# Patient Record
Sex: Female | Born: 1955
Health system: Southern US, Community
[De-identification: ages and names within clinical notes are randomized; demographics above are authoritative.]

## PROBLEM LIST (undated history)

## (undated) DIAGNOSIS — R000 Tachycardia, unspecified: Secondary | ICD-10-CM

## (undated) DIAGNOSIS — Z8489 Family history of other specified conditions: Secondary | ICD-10-CM

## (undated) DIAGNOSIS — G5622 Lesion of ulnar nerve, left upper limb: Secondary | ICD-10-CM

## (undated) DIAGNOSIS — J449 Chronic obstructive pulmonary disease, unspecified: Secondary | ICD-10-CM

## (undated) DIAGNOSIS — K219 Gastro-esophageal reflux disease without esophagitis: Secondary | ICD-10-CM

## (undated) DIAGNOSIS — Z98811 Dental restoration status: Secondary | ICD-10-CM

## (undated) DIAGNOSIS — Z8709 Personal history of other diseases of the respiratory system: Secondary | ICD-10-CM

## (undated) DIAGNOSIS — M1990 Unspecified osteoarthritis, unspecified site: Secondary | ICD-10-CM

## (undated) DIAGNOSIS — E039 Hypothyroidism, unspecified: Secondary | ICD-10-CM

## (undated) DIAGNOSIS — G5602 Carpal tunnel syndrome, left upper limb: Secondary | ICD-10-CM

## (undated) HISTORY — PX: THYROIDECTOMY, PARTIAL: SHX18

## (undated) HISTORY — DX: Hypothyroidism, unspecified: E03.9

## (undated) HISTORY — PX: CATARACT EXTRACTION W/ INTRAOCULAR LENS IMPLANT: SHX1309

## (undated) HISTORY — DX: Chronic obstructive pulmonary disease, unspecified: J44.9

## (undated) HISTORY — PX: BREAST LUMPECTOMY: SHX2

## (undated) HISTORY — PX: TONSILLECTOMY: SUR1361

## (undated) HISTORY — DX: Gastro-esophageal reflux disease without esophagitis: K21.9

## (undated) HISTORY — PX: MULTIPLE TOOTH EXTRACTIONS: SHX2053

---

## 2000-04-27 ENCOUNTER — Encounter: Admission: RE | Admit: 2000-04-27 | Discharge: 2000-04-27 | Payer: Self-pay | Admitting: Gastroenterology

## 2000-04-27 ENCOUNTER — Encounter: Payer: Self-pay | Admitting: Gastroenterology

## 2001-05-09 ENCOUNTER — Encounter: Admission: RE | Admit: 2001-05-09 | Discharge: 2001-05-09 | Payer: Self-pay | Admitting: Gastroenterology

## 2001-05-09 ENCOUNTER — Encounter: Payer: Self-pay | Admitting: Gastroenterology

## 2001-10-03 ENCOUNTER — Other Ambulatory Visit: Admission: RE | Admit: 2001-10-03 | Discharge: 2001-10-03 | Payer: Self-pay | Admitting: Obstetrics and Gynecology

## 2001-10-10 ENCOUNTER — Encounter: Payer: Self-pay | Admitting: Gastroenterology

## 2001-10-10 ENCOUNTER — Encounter: Admission: RE | Admit: 2001-10-10 | Discharge: 2001-10-10 | Payer: Self-pay | Admitting: Gastroenterology

## 2002-05-12 ENCOUNTER — Encounter: Payer: Self-pay | Admitting: Gastroenterology

## 2002-05-12 ENCOUNTER — Encounter: Admission: RE | Admit: 2002-05-12 | Discharge: 2002-05-12 | Payer: Self-pay | Admitting: Gastroenterology

## 2002-10-13 ENCOUNTER — Other Ambulatory Visit: Admission: RE | Admit: 2002-10-13 | Discharge: 2002-10-13 | Payer: Self-pay | Admitting: Obstetrics and Gynecology

## 2003-05-04 ENCOUNTER — Ambulatory Visit (HOSPITAL_COMMUNITY): Admission: RE | Admit: 2003-05-04 | Discharge: 2003-05-04 | Payer: Self-pay | Admitting: Gastroenterology

## 2003-05-14 ENCOUNTER — Encounter: Payer: Self-pay | Admitting: Gastroenterology

## 2003-05-14 ENCOUNTER — Encounter: Admission: RE | Admit: 2003-05-14 | Discharge: 2003-05-14 | Payer: Self-pay | Admitting: Gastroenterology

## 2003-11-29 ENCOUNTER — Other Ambulatory Visit: Admission: RE | Admit: 2003-11-29 | Discharge: 2003-11-29 | Payer: Self-pay | Admitting: Obstetrics and Gynecology

## 2004-05-26 ENCOUNTER — Encounter: Admission: RE | Admit: 2004-05-26 | Discharge: 2004-05-26 | Payer: Self-pay | Admitting: Gastroenterology

## 2005-03-26 ENCOUNTER — Ambulatory Visit (HOSPITAL_BASED_OUTPATIENT_CLINIC_OR_DEPARTMENT_OTHER): Admission: RE | Admit: 2005-03-26 | Discharge: 2005-03-26 | Payer: Self-pay | Admitting: Orthopedic Surgery

## 2005-03-26 ENCOUNTER — Encounter (INDEPENDENT_AMBULATORY_CARE_PROVIDER_SITE_OTHER): Payer: Self-pay | Admitting: *Deleted

## 2005-03-26 ENCOUNTER — Ambulatory Visit (HOSPITAL_COMMUNITY): Admission: RE | Admit: 2005-03-26 | Discharge: 2005-03-26 | Payer: Self-pay | Admitting: Orthopedic Surgery

## 2005-03-26 HISTORY — PX: MASS EXCISION: SHX2000

## 2005-05-29 ENCOUNTER — Encounter: Admission: RE | Admit: 2005-05-29 | Discharge: 2005-05-29 | Payer: Self-pay | Admitting: Gastroenterology

## 2006-01-01 ENCOUNTER — Encounter: Admission: RE | Admit: 2006-01-01 | Discharge: 2006-01-01 | Payer: Self-pay | Admitting: Gastroenterology

## 2006-06-22 ENCOUNTER — Encounter: Admission: RE | Admit: 2006-06-22 | Discharge: 2006-06-22 | Payer: Self-pay | Admitting: Gastroenterology

## 2006-06-30 ENCOUNTER — Ambulatory Visit: Payer: Self-pay | Admitting: Internal Medicine

## 2006-07-30 ENCOUNTER — Ambulatory Visit: Payer: Self-pay | Admitting: Internal Medicine

## 2007-06-29 ENCOUNTER — Encounter: Admission: RE | Admit: 2007-06-29 | Discharge: 2007-06-29 | Payer: Self-pay | Admitting: Gastroenterology

## 2007-11-16 ENCOUNTER — Encounter: Payer: Self-pay | Admitting: Internal Medicine

## 2008-07-02 ENCOUNTER — Encounter: Admission: RE | Admit: 2008-07-02 | Discharge: 2008-07-02 | Payer: Self-pay | Admitting: Gastroenterology

## 2008-07-13 ENCOUNTER — Encounter: Admission: RE | Admit: 2008-07-13 | Discharge: 2008-07-13 | Payer: Self-pay | Admitting: Gastroenterology

## 2009-07-26 ENCOUNTER — Encounter: Admission: RE | Admit: 2009-07-26 | Discharge: 2009-07-26 | Payer: Self-pay | Admitting: Internal Medicine

## 2009-09-20 ENCOUNTER — Encounter: Admission: RE | Admit: 2009-09-20 | Discharge: 2009-09-20 | Payer: Self-pay | Admitting: Internal Medicine

## 2010-07-28 ENCOUNTER — Encounter: Admission: RE | Admit: 2010-07-28 | Discharge: 2010-07-28 | Payer: Self-pay | Admitting: Obstetrics and Gynecology

## 2010-12-17 ENCOUNTER — Encounter
Admission: RE | Admit: 2010-12-17 | Discharge: 2010-12-17 | Payer: Self-pay | Source: Home / Self Care | Attending: Obstetrics and Gynecology | Admitting: Obstetrics and Gynecology

## 2010-12-30 NOTE — Consult Note (Signed)
Summary: [facility]  [facility]   Imported By: Esmeralda Links D'jimraou 12/28/2007 12:26:16  _____________________________________________________________________  External Attachment:    Type:   Image     Comment:   External Document

## 2011-04-17 NOTE — Op Note (Signed)
NAMECHANCEY, CULLINANE                ACCOUNT NO.:  [id number]   MEDICAL RECORD NO.:  [id number]          PATIENT TYPE:  AMB   LOCATION:  DSC                          FACILITY:  [facility]   PHYSICIAN:  Cindee Salt, M.D.       DATE OF BIRTH:  02-05-1956   DATE OF PROCEDURE:  03/26/2005  DATE OF DISCHARGE:                                 OPERATIVE REPORT   PREOPERATIVE DIAGNOSIS:  Mass, left thumb.   POSTOPERATIVE DIAGNOSIS:  Mass, left thumb.   OPERATION:  Excision of mass, left thumb.   SURGEON:  Cindee Salt, M.D.   ASSISTANT:  Carolyne Fiscal R.N.   ANESTHESIA:  Forearm based IV regional.   HISTORY:  The patient is a 55 year old female with a history of a mass on  the ulnar aspect of the distal phalanx of her left thumb. This is painful.  It has a white pointing area.   PROCEDURE:  The patient is brought to the operating room where a forearm  based IV regional anesthetic was carried out without difficulty. She was  prepped using DuraPrep, supine position, left arm free. A mid lateral  incision was made directly over the mass, carried down through subcutaneous  tissue. A white, pearly, multilobulated solid structure was immediately  encountered.  With blunt and sharp dissection, this was dissected free  protecting neurovascular structures. The specimen was sent to pathology. It  was not opened on the table.  It had the appearance of an epidermal  inclusion cyst. The wound was irrigated. Skin was closed with interrupted 5-  0 nylon sutures. A sterile compressive dressing was applied. The patient  tolerated the procedure well and was taken to the recovery room for  observation in satisfactory condition. She is discharged home to return to  the [facility] [place] in one week on [place] NX.      GK/MEDQ  D:  03/26/2005  T:  03/26/2005  Job:  [id number]

## 2011-04-17 NOTE — Assessment & Plan Note (Signed)
[facility]                               PULMONARY OFFICE NOTE   NAME:[name] [name], [name] [name]                       MRN:          [id number]  DATE:07/30/2006                            DOB:          07-13-1956    PULMONARY/EXTENDED FINAL FOLLOW UP SUMMARY OFFICE VISIT:  This is a 55-year-  old white female remote smoker who had been coughing since Nausea or  vomiting of 2006. She was already taking Prevacid 30 mg b.i.d. along with  Allegra 180 daily when I saw her on June 30, 2006.  I recommended that she  continue to take Prevacid and add Singulair to her regimen empirically,  awaiting follow up PFTs today. She states that she did fine on the Singulair  as long as she took it but when she ran out 3 days ago began having the  cough again.  During this time she did not require any Delsym. She denies  any significant dyspnea with activities of daily living although admits she  is not aerobically active. She denies any nocturnal wheeze, cough, fevers,  chills, sweats, orthopnea or paroxysmal nocturnal dyspnea.   PHYSICAL EXAMINATION:  GENERAL:  She is a robust, pleasant 55 year old white  female in no acute distress.  VITAL SIGNS:  Afebrile.  HEENT:  Reveals moderate nonspecific turbinate edema with increased mucoid  secretions. Oropharynx is clear with no exudates, post nasal drainage.  NECK:  Supple without cervical adenopathy or tenderness. Trachea is midline.  LUNG FIELDS:  Are perfectly clear to auscultation and percussion with  excellent air movement.  CARDIAC:  Regular rhythm without murmurs, rubs, or gallops.  ABDOMEN:  Soft, benign.  EXTREMITIES:  No calf tenderness, cyanosis, clubbing or edema.   Pulmonary function tests revealed an FEV1 of 78% predicted; however, there  is a 45% ratio and a 13% improvement after bronchodilator. Her diffusion  capacity is only 66%.   IMPRESSION:  This patient has classic emphysematous chronic obstructive  pulmonary disease by pulmonary function tests. This is the bad news. The  good news is that she does appear to have a small significant reversal  asthmatic component that is symptomatically responsive to Singulair and  therefore I have recommended she continue Singulair daily.   She also has clear-cut evidence of underlying rhinitis which may respond to  Singulair as well and allow her to switch Allegra to a p.r.n. basis. If this  is not effective, for her rhinitis, certainly using Ocean nasal spray and/or  nasal steroids might be an option.   I spent the greatest proportion of the visit today though discussing the  actual history of COPD and emphasizing to her that although she does have  smoker's lungs, by PFTs, with an FEV1 of 78% predicted, as long as she  does not resume smoking or suffer from significant obesity with  deconditioning, which seems unlikely, she should do fine.   If the cough recurs while on Singulair, I have asked her to see me back for  further follow up which probably would include a sinus CT scan and a trial  of  inhaled steroids.   If the cough remains in remission, I have asked her to maintain the  Singulair for at least a year and have Dr. Sherin Quarry refill it, with  pulmonary follow up on a p.r.n. basis.   I did emphasize to her that sometimes reflux is secondary to cough and that  if she maintains in remission in terms of the cough, she should defer to Dr.  Sherin Quarry in terms of dosing the Prevacid to a lower level if he agrees.                                   [name] [name]. Sherene Sires, MD, [facility]   MBW/MedQ  DD:  07/30/2006  DT:  07/30/2006  Job #:  [id number]   cc:   Tasia Catchings, MD

## 2011-04-17 NOTE — Assessment & Plan Note (Signed)
[facility]                               PULMONARY OFFICE NOTE   NAME:[name] [name] VANOSTRAND                       MRN:          [id number]  DATE:06/30/2006                            DOB:          11/23/1956    PULMONARY CONSULTATION:  Requested by [name] Catchings, MD   REASON FOR CONSULTATION:  Cough.   HISTORY:  This is a 55 year old white female who quit smoking over a year  ago and about the time she went to [place] developed a cough that has been  present daily since.  It occurs within 10 minutes of rising every morning  but does wake her from her sleep 3-4 times a week and is mostly mucoid in  terms of sputum production and minimal volume is produced.  She is slightly  better having been started on Prevacid and associates it with nasal  congestion and mild hoarseness but has had symptoms of chronic seasonal  rhinitis, spring greater than fall, for 15 years and doesn't think that's  what it is.   She denies any difficulty with swallowing, fevers, chills, sweats,  orthopnea, PND or leg swelling.   PAST MEDICAL HISTORY:  1.  Hyperlipidemia.  2.  Thyroid surgery.  3.  History of acid reflux.   ALLERGIES:  CODEINE, nonspecific.   MEDICATIONS:  1.  Prevacid 30 mg b.i.d.  2.  Synthroid 25 mcg daily.  3.  Allegra 180 mg daily.   SOCIAL HISTORY:  She quit smoking about a year ago.  Denies any chronic  cough at that point.  She works as a Psychologist, occupational.   FAMILY HISTORY:  Reported in detail and significant for allergies in her  brother.  Negative for asthma to her knowledge.   REVIEW OF SYSTEMS:  Taken in detail on the worksheet and significant for the  problems as outlined above.   PHYSICAL EXAMINATION:  GENERAL:  This is a pleasant, thin, ambulatory white  female in no acute distress.  She does have slightly hoarse phonation and  voice fatigue.  VITAL SIGNS:  Stable vital signs.  HEENT:  Remarkable for mild turbinate edema with mucopurulent  changes, right  greater than left with no typical polyps or cyanosis or pallor.  Oropharynx  is clear.  Neck is supple without cervical adenopathy or tenderness.  Ear  canals clear bilaterally.  CHEST:  Lung fields reveal minimal rhonchi bilaterally, however air movement  is adequate.  There is cough on inspiration but not consistently.  CARDIAC:  There is a regular rhythm without murmur, gallop, or rub present.  ABDOMEN:  Soft, benign.  EXTREMITIES:  Warm without calf tenderness, cyanosis or clubbing.   Chest x-ray was reviewed from January 02, 2006.  Does suggest mild to  moderate emphysematous change with bilateral pleural parenchymal fibrotic  change.   IMPRESSION:  Chronic rhinitis, previously seasonal, now with a cough that  appeared to flare with international travel, consistent with a possible  underlying sinusitis.  She already is somewhat better with proton pump  inhibitors, but I suspect that is because of the cyclical nature of the  cough and recommend the following:   1.  Continue PPIs perfectly regularly b.i.d. but add also dietary      restrictions, including the avoidance of mint and menthol-containing      lozenges, which relax the gastroesophageal valve.  2.  Add Delsym 2 teaspoons b.i.d. to control cyclical coughing.  3.  Add empirically Singulair 10 mg daily (normally I would add a very short      course of prednisone but she said she was afraid of that, and I think      it is reasonable to try Singulair since she may very well have a      combination of rhinitis and very low-grade asthma.  Reports patients      occasionally respond to Singulair very convincingly, not as consistently      as prednisone, of course, but the side effects of Singulair are minimal      compared to prednisone).  4.  Since she is a smoker, I told her that we would need to see this cough      to the end in terms of resolution or consider bronchoscopy.  I think      this is very low-yield,  however, based on the history obtained today,      with prominent upper airway features noted.   The patient plans to be seen by ENT between now and her next visit in 4  weeks, and at that time when she returns we will do PFTs to assess the  severity of COPD, if any, status post smoking cessation a year ago.                                   [name] [name]. [name] Sires, MD, [name] [name]   MBW/MedQ  DD:  06/30/2006  DT:  07/01/2006  Job #:  [id number]

## 2011-06-01 ENCOUNTER — Other Ambulatory Visit: Payer: Self-pay | Admitting: Obstetrics and Gynecology

## 2011-06-25 ENCOUNTER — Other Ambulatory Visit: Payer: Self-pay | Admitting: Family Medicine

## 2011-06-25 DIAGNOSIS — Z1231 Encounter for screening mammogram for malignant neoplasm of breast: Secondary | ICD-10-CM

## 2011-08-10 ENCOUNTER — Ambulatory Visit
Admission: RE | Admit: 2011-08-10 | Discharge: 2011-08-10 | Disposition: A | Discharge status: Home / Self Care | Payer: BC Managed Care – PPO | Source: Ambulatory Visit | Attending: Family Medicine | Admitting: Family Medicine

## 2011-08-10 ENCOUNTER — Other Ambulatory Visit: Payer: Self-pay | Admitting: Geriatric Medicine

## 2011-08-10 DIAGNOSIS — Z1231 Encounter for screening mammogram for malignant neoplasm of breast: Secondary | ICD-10-CM

## 2011-08-17 ENCOUNTER — Other Ambulatory Visit: Payer: Self-pay | Admitting: Gastroenterology

## 2012-07-28 ENCOUNTER — Other Ambulatory Visit: Payer: Self-pay | Admitting: Obstetrics and Gynecology

## 2012-07-28 DIAGNOSIS — Z1231 Encounter for screening mammogram for malignant neoplasm of breast: Secondary | ICD-10-CM

## 2012-08-08 ENCOUNTER — Other Ambulatory Visit (HOSPITAL_COMMUNITY): Payer: Self-pay | Admitting: Geriatric Medicine

## 2012-08-08 DIAGNOSIS — R0602 Shortness of breath: Secondary | ICD-10-CM

## 2012-08-16 ENCOUNTER — Ambulatory Visit (HOSPITAL_COMMUNITY)
Admission: RE | Admit: 2012-08-16 | Discharge: 2012-08-16 | Disposition: A | Discharge status: Home / Self Care | Payer: BC Managed Care – PPO | Source: Ambulatory Visit | Attending: Geriatric Medicine | Admitting: Geriatric Medicine

## 2012-08-16 DIAGNOSIS — R0602 Shortness of breath: Secondary | ICD-10-CM | POA: Insufficient documentation

## 2012-08-16 LAB — PULMONARY FUNCTION TEST

## 2012-08-16 MED ORDER — ALBUTEROL SULFATE (5 MG/ML) 0.5% IN NEBU
2.5000 mg | INHALATION_SOLUTION | Freq: Once | RESPIRATORY_TRACT | Status: AC
  Administered 2012-08-16: 2.5 mg via RESPIRATORY_TRACT

## 2012-09-08 ENCOUNTER — Other Ambulatory Visit: Payer: Self-pay | Admitting: Obstetrics and Gynecology

## 2012-09-08 ENCOUNTER — Ambulatory Visit: Payer: BC Managed Care – PPO

## 2012-09-08 DIAGNOSIS — M8580 Other specified disorders of bone density and structure, unspecified site: Secondary | ICD-10-CM

## 2012-09-13 ENCOUNTER — Ambulatory Visit
Admission: RE | Admit: 2012-09-13 | Discharge: 2012-09-13 | Disposition: A | Discharge status: Home / Self Care | Payer: BC Managed Care – PPO | Source: Ambulatory Visit | Attending: Obstetrics and Gynecology | Admitting: Obstetrics and Gynecology

## 2012-09-13 DIAGNOSIS — Z1231 Encounter for screening mammogram for malignant neoplasm of breast: Secondary | ICD-10-CM

## 2012-09-15 ENCOUNTER — Encounter: Payer: Self-pay | Admitting: Internal Medicine

## 2012-09-16 ENCOUNTER — Encounter: Payer: Self-pay | Admitting: Internal Medicine

## 2012-09-16 ENCOUNTER — Ambulatory Visit (INDEPENDENT_AMBULATORY_CARE_PROVIDER_SITE_OTHER): Payer: BC Managed Care – PPO | Admitting: Internal Medicine

## 2012-09-16 VITALS — BP 114/80 | HR 88 | Temp 98.0°F | Ht 67.5 in | Wt 171.5 lb

## 2012-09-16 DIAGNOSIS — K219 Gastro-esophageal reflux disease without esophagitis: Secondary | ICD-10-CM

## 2012-09-16 DIAGNOSIS — J449 Chronic obstructive pulmonary disease, unspecified: Secondary | ICD-10-CM | POA: Insufficient documentation

## 2012-09-16 NOTE — Progress Notes (Signed)
  Subjective:    Patient ID: [name] [name], female    DOB: [birth year]  MRN: [id number]  HPI  24 yowm quit smoking around 2006 @ wt 135-140 with no resp problems x year round rhinitis/sinusitis  at all until the summer 2013 with doe referred 09/16/2012 to pulmonary clinic by Dr Alexis Goodell  09/16/2012 1st pulmonary eval cc new  Indolent onset mild doe early summer of 2013 x fast walk and no progression since and some better on tudorza but actually doesn't use this early on in the day and once takes it able to do all desired adls s difficulty.  No obvious daytime variabilty or assoc significantly productive cough or cp or chest tightness, subjective wheeze overt sinus or hb symptoms. No unusual exp hx or h/o childhood pna/ asthma or premature birth to her knowledge.   Sleeping ok without nocturnal  or early am exacerbation  of respiratory  c/o's or need for noct saba. Also denies any obvious fluctuation of symptoms with weather or environmental changes or other aggravating or alleviating factors except as outlined above    Review of Systems  Constitutional: Negative for fever, chills and unexpected weight change.  HENT: Positive for dental problem. Negative for ear pain, nosebleeds, congestion, sore throat, rhinorrhea, sneezing, trouble swallowing, voice change, postnasal drip and sinus pressure.   Eyes: Negative for visual disturbance.  Respiratory: Positive for cough and shortness of breath. Negative for choking.   Cardiovascular: Negative for chest pain and leg swelling.  Gastrointestinal: Negative for vomiting, abdominal pain and diarrhea.  Genitourinary: Negative for difficulty urinating.  Musculoskeletal: Negative for arthralgias.  Skin: Negative for rash.  Neurological: Negative for tremors, syncope and headaches.  Hematological: Does not bruise/bleed easily.       Objective:   Physical Exam  amb anxious wf nad 09/16/2012 171  HEENT mild turbinate edema.  Oropharynx no thrush or  excess pnd or cobblestoning.  No JVD or cervical adenopathy. Mild accessory muscle hypertrophy. Trachea midline, nl thryroid. Chest was mildley  hyperinflated by percussion with diminished breath sounds and mild increased exp time without wheeze. Hoover sign positive late in inspiration. Regular rate and rhythm without murmur gallop or rub or increase P2 or edema.  Abd: no hsm, nl excursion. Ext warm without cyanosis or clubbing.    cxr 08/03/12 Copd/emphysema with apical pleural scarring    Assessment & Plan:

## 2012-09-16 NOTE — Patient Instructions (Addendum)
Take tudora first thing in am and then about 12 hour later  Prilosec 20 mg Take 30- 60 min before your first and last meals of the day   GERD (REFLUX)  is an extremely common cause of respiratory symptoms, many times with no significant heartburn at all.    It can be treated with medication, but also with lifestyle changes including avoidance of late meals, excessive alcohol, smoking cessation, and avoid fatty foods, chocolate, peppermint, colas, red wine, and acidic juices such as orange juice.  NO MINT OR MENTHOL PRODUCTS SO NO COUGH DROPS  USE SUGARLESS CANDY INSTEAD (jolley ranchers or Stover's)  NO OIL BASED VITAMINS - use powdered substitutes.   Exercise 30 min daily at level where short of breath not out of breath   If you are satisfied with your treatment plan let your doctor know and he/she can either refill your medications or you can return here when your prescription runs out.     If in any way you are not 100% satisfied,  please tell us.  If 100% better, tell your friends!

## 2012-09-18 DIAGNOSIS — K219 Gastro-esophageal reflux disease without esophagitis: Secondary | ICD-10-CM | POA: Insufficient documentation

## 2012-09-18 NOTE — Assessment & Plan Note (Addendum)
-   PFT's 08/08/12  FEV1 1.71 ( 57%) ratio 51  DLCO 59 and no better p B2  GOLD II s/p remote smoking cessation with sign wt gain since she quit but relatively well compensated on mono therapy with effective lama (tudorza - she just needs to remember to take it first thing each am)  I reviewed the Flethcher curve with patient that basically indicates  if you quit smoking when your best day FEV1 is still well preserved (which hers is) it is highly unlikely you will progress to severe disease and informed the patient there was no medication on the market that has proven to change the curve or the likelihood of progression.  Therefore stopping smoking and maintaining abstinence is the most important aspect of care, not choice of inhalers or for that matter, doctors, as there is nothing proven to change the natural hx of the dz x smoking cessation, which she has already accomplished  Discussed paced exercise, dpi technique, f/u prn

## 2012-09-18 NOTE — Assessment & Plan Note (Signed)
Reviewed diet and timing for ppi and potential impact on resp symptoms with age/ wt gain.

## 2012-12-27 ENCOUNTER — Ambulatory Visit
Admission: RE | Admit: 2012-12-27 | Discharge: 2012-12-27 | Disposition: A | Discharge status: Home / Self Care | Payer: BC Managed Care – PPO | Source: Ambulatory Visit | Attending: Obstetrics and Gynecology | Admitting: Obstetrics and Gynecology

## 2012-12-27 DIAGNOSIS — M8580 Other specified disorders of bone density and structure, unspecified site: Secondary | ICD-10-CM

## 2013-08-14 ENCOUNTER — Other Ambulatory Visit: Payer: Self-pay

## 2013-08-14 DIAGNOSIS — Z1231 Encounter for screening mammogram for malignant neoplasm of breast: Secondary | ICD-10-CM

## 2013-09-25 ENCOUNTER — Ambulatory Visit: Payer: BC Managed Care – PPO

## 2013-10-18 ENCOUNTER — Ambulatory Visit
Admission: RE | Admit: 2013-10-18 | Discharge: 2013-10-18 | Disposition: A | Discharge status: Home / Self Care | Payer: BC Managed Care – PPO | Source: Ambulatory Visit

## 2013-10-18 DIAGNOSIS — Z1231 Encounter for screening mammogram for malignant neoplasm of breast: Secondary | ICD-10-CM

## 2014-02-01 ENCOUNTER — Other Ambulatory Visit: Payer: Self-pay | Admitting: Geriatric Medicine

## 2014-02-01 DIAGNOSIS — Z87891 Personal history of nicotine dependence: Secondary | ICD-10-CM

## 2014-02-12 ENCOUNTER — Ambulatory Visit
Admission: RE | Admit: 2014-02-12 | Discharge: 2014-02-12 | Disposition: A | Discharge status: Home / Self Care | Payer: No Typology Code available for payment source | Source: Ambulatory Visit | Attending: Geriatric Medicine | Admitting: Geriatric Medicine

## 2014-02-12 DIAGNOSIS — Z87891 Personal history of nicotine dependence: Secondary | ICD-10-CM

## 2014-02-12 MED ORDER — IOHEXOL 300 MG/ML  SOLN: 100.0000 mL | Freq: Once | INTRAMUSCULAR | Status: DC | PRN

## 2014-05-28 ENCOUNTER — Other Ambulatory Visit: Payer: Self-pay | Admitting: Geriatric Medicine

## 2014-05-28 ENCOUNTER — Ambulatory Visit
Admission: RE | Admit: 2014-05-28 | Discharge: 2014-05-28 | Disposition: A | Discharge status: Home / Self Care | Payer: BC Managed Care – PPO | Source: Ambulatory Visit | Attending: Geriatric Medicine | Admitting: Geriatric Medicine

## 2014-05-28 DIAGNOSIS — M25551 Pain in right hip: Secondary | ICD-10-CM

## 2014-09-20 ENCOUNTER — Other Ambulatory Visit: Payer: Self-pay

## 2014-09-20 DIAGNOSIS — Z1231 Encounter for screening mammogram for malignant neoplasm of breast: Secondary | ICD-10-CM

## 2014-10-19 ENCOUNTER — Ambulatory Visit
Admission: RE | Admit: 2014-10-19 | Discharge: 2014-10-19 | Disposition: A | Discharge status: Home / Self Care | Payer: BC Managed Care – PPO | Source: Ambulatory Visit

## 2014-10-19 DIAGNOSIS — Z1231 Encounter for screening mammogram for malignant neoplasm of breast: Secondary | ICD-10-CM

## 2015-04-02 ENCOUNTER — Other Ambulatory Visit: Payer: Self-pay | Admitting: Geriatric Medicine

## 2015-04-02 DIAGNOSIS — F17211 Nicotine dependence, cigarettes, in remission: Secondary | ICD-10-CM

## 2015-05-06 ENCOUNTER — Ambulatory Visit
Admission: RE | Admit: 2015-05-06 | Discharge: 2015-05-06 | Disposition: A | Discharge status: Home / Self Care | Payer: No Typology Code available for payment source | Source: Ambulatory Visit | Attending: Geriatric Medicine | Admitting: Geriatric Medicine

## 2015-05-06 DIAGNOSIS — F17211 Nicotine dependence, cigarettes, in remission: Secondary | ICD-10-CM

## 2015-09-24 ENCOUNTER — Other Ambulatory Visit: Payer: Self-pay

## 2015-09-24 DIAGNOSIS — Z1231 Encounter for screening mammogram for malignant neoplasm of breast: Secondary | ICD-10-CM

## 2015-10-29 ENCOUNTER — Ambulatory Visit
Admission: RE | Admit: 2015-10-29 | Discharge: 2015-10-29 | Disposition: A | Discharge status: Home / Self Care | Payer: BLUE CROSS/BLUE SHIELD | Source: Ambulatory Visit

## 2015-10-29 DIAGNOSIS — Z1231 Encounter for screening mammogram for malignant neoplasm of breast: Secondary | ICD-10-CM

## 2015-11-26 ENCOUNTER — Other Ambulatory Visit: Payer: Self-pay | Admitting: Obstetrics and Gynecology

## 2015-11-27 ENCOUNTER — Other Ambulatory Visit: Payer: Self-pay | Admitting: Obstetrics and Gynecology

## 2015-11-27 DIAGNOSIS — M8580 Other specified disorders of bone density and structure, unspecified site: Secondary | ICD-10-CM

## 2016-01-01 ENCOUNTER — Ambulatory Visit
Admission: RE | Admit: 2016-01-01 | Discharge: 2016-01-01 | Disposition: A | Discharge status: Home / Self Care | Payer: BLUE CROSS/BLUE SHIELD | Source: Ambulatory Visit | Attending: Obstetrics and Gynecology | Admitting: Obstetrics and Gynecology

## 2016-01-01 DIAGNOSIS — M8580 Other specified disorders of bone density and structure, unspecified site: Secondary | ICD-10-CM

## 2016-04-24 ENCOUNTER — Other Ambulatory Visit: Payer: Self-pay | Admitting: Geriatric Medicine

## 2016-04-24 DIAGNOSIS — Z139 Encounter for screening, unspecified: Secondary | ICD-10-CM

## 2016-05-05 ENCOUNTER — Ambulatory Visit: Payer: BLUE CROSS/BLUE SHIELD

## 2016-05-08 ENCOUNTER — Ambulatory Visit
Admission: RE | Admit: 2016-05-08 | Discharge: 2016-05-08 | Disposition: A | Discharge status: Home / Self Care | Payer: BLUE CROSS/BLUE SHIELD | Source: Ambulatory Visit | Attending: Geriatric Medicine | Admitting: Geriatric Medicine

## 2016-05-08 DIAGNOSIS — Z139 Encounter for screening, unspecified: Secondary | ICD-10-CM

## 2016-10-05 ENCOUNTER — Other Ambulatory Visit: Payer: Self-pay | Admitting: Geriatric Medicine

## 2016-10-05 DIAGNOSIS — Z1231 Encounter for screening mammogram for malignant neoplasm of breast: Secondary | ICD-10-CM

## 2016-11-03 ENCOUNTER — Ambulatory Visit
Admission: RE | Admit: 2016-11-03 | Discharge: 2016-11-03 | Disposition: A | Discharge status: Home / Self Care | Payer: BLUE CROSS/BLUE SHIELD | Source: Ambulatory Visit | Attending: Geriatric Medicine | Admitting: Geriatric Medicine

## 2016-11-03 DIAGNOSIS — Z1231 Encounter for screening mammogram for malignant neoplasm of breast: Secondary | ICD-10-CM

## 2016-12-29 ENCOUNTER — Other Ambulatory Visit: Payer: Self-pay | Admitting: Orthopedic Surgery

## 2017-01-06 ENCOUNTER — Encounter (HOSPITAL_BASED_OUTPATIENT_CLINIC_OR_DEPARTMENT_OTHER): Payer: Self-pay | Admitting: *Deleted

## 2017-01-12 ENCOUNTER — Ambulatory Visit (HOSPITAL_BASED_OUTPATIENT_CLINIC_OR_DEPARTMENT_OTHER)
Admission: RE | Admit: 2017-01-12 | Payer: BLUE CROSS/BLUE SHIELD | Source: Ambulatory Visit | Admitting: Orthopedic Surgery

## 2017-01-12 SURGERY — CARPAL TUNNEL RELEASE
Anesthesia: Choice | Laterality: Left

## 2017-05-10 ENCOUNTER — Other Ambulatory Visit: Payer: Self-pay | Admitting: Geriatric Medicine

## 2017-05-10 DIAGNOSIS — F17211 Nicotine dependence, cigarettes, in remission: Secondary | ICD-10-CM

## 2017-05-13 ENCOUNTER — Ambulatory Visit: Payer: BLUE CROSS/BLUE SHIELD

## 2017-05-14 ENCOUNTER — Ambulatory Visit
Admission: RE | Admit: 2017-05-14 | Discharge: 2017-05-14 | Disposition: A | Discharge status: Home / Self Care | Payer: BLUE CROSS/BLUE SHIELD | Source: Ambulatory Visit | Attending: Geriatric Medicine | Admitting: Geriatric Medicine

## 2017-05-14 ENCOUNTER — Other Ambulatory Visit: Payer: Self-pay | Admitting: Orthopedic Surgery

## 2017-05-14 DIAGNOSIS — F17211 Nicotine dependence, cigarettes, in remission: Secondary | ICD-10-CM

## 2017-05-26 ENCOUNTER — Other Ambulatory Visit (HOSPITAL_COMMUNITY): Payer: Self-pay | Admitting: Endocrinology

## 2017-05-26 DIAGNOSIS — R911 Solitary pulmonary nodule: Secondary | ICD-10-CM

## 2017-05-30 DIAGNOSIS — G5602 Carpal tunnel syndrome, left upper limb: Secondary | ICD-10-CM

## 2017-05-30 DIAGNOSIS — G5622 Lesion of ulnar nerve, left upper limb: Secondary | ICD-10-CM

## 2017-05-30 HISTORY — DX: Lesion of ulnar nerve, left upper limb: G56.22

## 2017-05-30 HISTORY — DX: Carpal tunnel syndrome, left upper limb: G56.02

## 2017-06-04 ENCOUNTER — Other Ambulatory Visit (HOSPITAL_COMMUNITY): Payer: Self-pay | Admitting: Geriatric Medicine

## 2017-06-04 ENCOUNTER — Ambulatory Visit (HOSPITAL_COMMUNITY): Payer: BLUE CROSS/BLUE SHIELD

## 2017-06-04 ENCOUNTER — Encounter (HOSPITAL_COMMUNITY): Payer: Self-pay

## 2017-06-04 DIAGNOSIS — R911 Solitary pulmonary nodule: Secondary | ICD-10-CM

## 2017-06-08 ENCOUNTER — Encounter (INDEPENDENT_AMBULATORY_CARE_PROVIDER_SITE_OTHER): Payer: BLUE CROSS/BLUE SHIELD

## 2017-06-08 ENCOUNTER — Other Ambulatory Visit: Payer: Self-pay | Admitting: Geriatric Medicine

## 2017-06-08 DIAGNOSIS — R000 Tachycardia, unspecified: Secondary | ICD-10-CM | POA: Diagnosis not present

## 2017-06-10 ENCOUNTER — Encounter (HOSPITAL_BASED_OUTPATIENT_CLINIC_OR_DEPARTMENT_OTHER): Payer: Self-pay | Admitting: *Deleted

## 2017-06-17 ENCOUNTER — Ambulatory Visit (HOSPITAL_BASED_OUTPATIENT_CLINIC_OR_DEPARTMENT_OTHER): Payer: BLUE CROSS/BLUE SHIELD | Admitting: Anesthesiology

## 2017-06-17 ENCOUNTER — Ambulatory Visit (HOSPITAL_BASED_OUTPATIENT_CLINIC_OR_DEPARTMENT_OTHER)
Admission: RE | Admit: 2017-06-17 | Discharge: 2017-06-17 | Disposition: A | Discharge status: Home / Self Care | Payer: BLUE CROSS/BLUE SHIELD | Source: Ambulatory Visit | Attending: Orthopedic Surgery | Admitting: Orthopedic Surgery

## 2017-06-17 ENCOUNTER — Encounter (HOSPITAL_BASED_OUTPATIENT_CLINIC_OR_DEPARTMENT_OTHER)
Admission: RE | Disposition: A | Discharge status: Home / Self Care | Payer: Self-pay | Source: Ambulatory Visit | Attending: Orthopedic Surgery

## 2017-06-17 ENCOUNTER — Encounter (HOSPITAL_BASED_OUTPATIENT_CLINIC_OR_DEPARTMENT_OTHER): Payer: Self-pay | Admitting: *Deleted

## 2017-06-17 DIAGNOSIS — J449 Chronic obstructive pulmonary disease, unspecified: Secondary | ICD-10-CM | POA: Diagnosis not present

## 2017-06-17 DIAGNOSIS — M1711 Unilateral primary osteoarthritis, right knee: Secondary | ICD-10-CM | POA: Diagnosis not present

## 2017-06-17 DIAGNOSIS — G5622 Lesion of ulnar nerve, left upper limb: Secondary | ICD-10-CM | POA: Diagnosis not present

## 2017-06-17 DIAGNOSIS — M19032 Primary osteoarthritis, left wrist: Secondary | ICD-10-CM | POA: Insufficient documentation

## 2017-06-17 DIAGNOSIS — E039 Hypothyroidism, unspecified: Secondary | ICD-10-CM | POA: Insufficient documentation

## 2017-06-17 DIAGNOSIS — Z87891 Personal history of nicotine dependence: Secondary | ICD-10-CM | POA: Diagnosis not present

## 2017-06-17 DIAGNOSIS — Z885 Allergy status to narcotic agent status: Secondary | ICD-10-CM | POA: Diagnosis not present

## 2017-06-17 DIAGNOSIS — Z91018 Allergy to other foods: Secondary | ICD-10-CM | POA: Insufficient documentation

## 2017-06-17 DIAGNOSIS — K219 Gastro-esophageal reflux disease without esophagitis: Secondary | ICD-10-CM | POA: Diagnosis not present

## 2017-06-17 DIAGNOSIS — Z91010 Allergy to peanuts: Secondary | ICD-10-CM | POA: Diagnosis not present

## 2017-06-17 DIAGNOSIS — G5602 Carpal tunnel syndrome, left upper limb: Secondary | ICD-10-CM | POA: Diagnosis not present

## 2017-06-17 DIAGNOSIS — Z79899 Other long term (current) drug therapy: Secondary | ICD-10-CM | POA: Diagnosis not present

## 2017-06-17 HISTORY — DX: Unspecified osteoarthritis, unspecified site: M19.90

## 2017-06-17 HISTORY — PX: CARPAL TUNNEL RELEASE: SHX101

## 2017-06-17 HISTORY — DX: Tachycardia, unspecified: R00.0

## 2017-06-17 HISTORY — DX: Dental restoration status: Z98.811

## 2017-06-17 HISTORY — DX: Carpal tunnel syndrome, left upper limb: G56.02

## 2017-06-17 HISTORY — DX: Lesion of ulnar nerve, left upper limb: G56.22

## 2017-06-17 HISTORY — DX: Personal history of other diseases of the respiratory system: Z87.09

## 2017-06-17 HISTORY — DX: Family history of other specified conditions: Z84.89

## 2017-06-17 HISTORY — PX: ULNAR NERVE TRANSPOSITION: SHX2595

## 2017-06-17 SURGERY — CARPAL TUNNEL RELEASE
Anesthesia: General | Site: Wrist | Laterality: Left

## 2017-06-17 MED ORDER — FENTANYL CITRATE (PF) 100 MCG/2ML IJ SOLN
INTRAMUSCULAR | Status: AC
  Filled 2017-06-17: qty 2

## 2017-06-17 MED ORDER — LACTATED RINGERS IV SOLN: INTRAVENOUS | Status: DC

## 2017-06-17 MED ORDER — LACTATED RINGERS IV SOLN
INTRAVENOUS | Status: DC
  Administered 2017-06-17: 13:00:00 via INTRAVENOUS

## 2017-06-17 MED ORDER — MIDAZOLAM HCL 2 MG/2ML IJ SOLN
1.0000 mg | INTRAMUSCULAR | Status: DC | PRN
  Administered 2017-06-17: 2 mg via INTRAVENOUS

## 2017-06-17 MED ORDER — HYDROCODONE-ACETAMINOPHEN 5-325 MG PO TABS: 1.0000 | ORAL_TABLET | Freq: Four times a day (QID) | ORAL | 0 refills | Status: DC | PRN

## 2017-06-17 MED ORDER — SCOPOLAMINE 1 MG/3DAYS TD PT72: 1.0000 | MEDICATED_PATCH | Freq: Once | TRANSDERMAL | Status: DC | PRN

## 2017-06-17 MED ORDER — LIDOCAINE 2% (20 MG/ML) 5 ML SYRINGE
INTRAMUSCULAR | Status: DC | PRN
  Administered 2017-06-17: 60 mg via INTRAVENOUS

## 2017-06-17 MED ORDER — PROPOFOL 10 MG/ML IV BOLUS
INTRAVENOUS | Status: DC | PRN
  Administered 2017-06-17: 150 mg via INTRAVENOUS

## 2017-06-17 MED ORDER — FENTANYL CITRATE (PF) 100 MCG/2ML IJ SOLN
25.0000 ug | INTRAMUSCULAR | Status: DC | PRN
  Administered 2017-06-17: 50 ug via INTRAVENOUS

## 2017-06-17 MED ORDER — CHLORHEXIDINE GLUCONATE 4 % EX LIQD: 60.0000 mL | Freq: Once | CUTANEOUS | Status: DC

## 2017-06-17 MED ORDER — MIDAZOLAM HCL 2 MG/2ML IJ SOLN
INTRAMUSCULAR | Status: AC
  Filled 2017-06-17: qty 2

## 2017-06-17 MED ORDER — DEXAMETHASONE SODIUM PHOSPHATE 4 MG/ML IJ SOLN
INTRAMUSCULAR | Status: DC | PRN
  Administered 2017-06-17: 10 mg via INTRAVENOUS

## 2017-06-17 MED ORDER — DEXAMETHASONE SODIUM PHOSPHATE 10 MG/ML IJ SOLN
INTRAMUSCULAR | Status: AC
  Filled 2017-06-17: qty 1

## 2017-06-17 MED ORDER — METOCLOPRAMIDE HCL 5 MG/ML IJ SOLN: 10.0000 mg | Freq: Once | INTRAMUSCULAR | Status: DC | PRN

## 2017-06-17 MED ORDER — BUPIVACAINE HCL (PF) 0.25 % IJ SOLN
INTRAMUSCULAR | Status: DC | PRN
  Administered 2017-06-17: 10 mL

## 2017-06-17 MED ORDER — CEFAZOLIN SODIUM-DEXTROSE 2-4 GM/100ML-% IV SOLN
INTRAVENOUS | Status: AC
  Filled 2017-06-17: qty 100

## 2017-06-17 MED ORDER — MEPERIDINE HCL 25 MG/ML IJ SOLN: 6.2500 mg | INTRAMUSCULAR | Status: DC | PRN

## 2017-06-17 MED ORDER — FENTANYL CITRATE (PF) 100 MCG/2ML IJ SOLN
50.0000 ug | INTRAMUSCULAR | Status: AC | PRN
  Administered 2017-06-17 (×2): 25 ug via INTRAVENOUS
  Administered 2017-06-17: 50 ug via INTRAVENOUS

## 2017-06-17 MED ORDER — CEFAZOLIN SODIUM-DEXTROSE 2-4 GM/100ML-% IV SOLN
2.0000 g | INTRAVENOUS | Status: AC
  Administered 2017-06-17: 2 g via INTRAVENOUS

## 2017-06-17 MED ORDER — ONDANSETRON HCL 4 MG/2ML IJ SOLN
INTRAMUSCULAR | Status: AC
  Filled 2017-06-17: qty 2

## 2017-06-17 SURGICAL SUPPLY — 50 items
BLADE MINI RND TIP GREEN BEAV (BLADE) IMPLANT
BLADE SURG 15 STRL LF DISP TIS (BLADE) ×2 IMPLANT
BLADE SURG 15 STRL SS (BLADE) ×2
BNDG COHESIVE 3X5 TAN STRL LF (GAUZE/BANDAGES/DRESSINGS) ×8 IMPLANT
BNDG ESMARK 4X9 LF (GAUZE/BANDAGES/DRESSINGS) ×4 IMPLANT
BNDG GAUZE ELAST 4 BULKY (GAUZE/BANDAGES/DRESSINGS) ×4 IMPLANT
CHLORAPREP W/TINT 26ML (MISCELLANEOUS) ×4 IMPLANT
CORD BIPOLAR FORCEPS 12FT (ELECTRODE) ×4 IMPLANT
COVER BACK TABLE 60X90IN (DRAPES) ×4 IMPLANT
COVER MAYO STAND STRL (DRAPES) ×4 IMPLANT
CUFF TOURN SGL LL 18 NRW (TOURNIQUET CUFF) ×4 IMPLANT
CUFF TOURNIQUET SINGLE 18IN (TOURNIQUET CUFF) ×4 IMPLANT
DECANTER SPIKE VIAL GLASS SM (MISCELLANEOUS) IMPLANT
DRAPE EXTREMITY T 121X128X90 (DRAPE) ×4 IMPLANT
DRAPE SURG 17X23 STRL (DRAPES) ×4 IMPLANT
DRSG PAD ABDOMINAL 8X10 ST (GAUZE/BANDAGES/DRESSINGS) ×4 IMPLANT
GAUZE SPONGE 4X4 12PLY STRL (GAUZE/BANDAGES/DRESSINGS) ×4 IMPLANT
GAUZE SPONGE 4X4 16PLY XRAY LF (GAUZE/BANDAGES/DRESSINGS) IMPLANT
GAUZE XEROFORM 1X8 LF (GAUZE/BANDAGES/DRESSINGS) ×4 IMPLANT
GLOVE BIO SURGEON STRL SZ 6.5 (GLOVE) ×3 IMPLANT
GLOVE BIO SURGEONS STRL SZ 6.5 (GLOVE) ×1
GLOVE BIOGEL PI IND STRL 8 (GLOVE) ×2 IMPLANT
GLOVE BIOGEL PI IND STRL 8.5 (GLOVE) ×2 IMPLANT
GLOVE BIOGEL PI INDICATOR 8 (GLOVE) ×2
GLOVE BIOGEL PI INDICATOR 8.5 (GLOVE) ×2
GLOVE SURG ORTHO 8.0 STRL STRW (GLOVE) ×4 IMPLANT
GOWN STRL REUS W/ TWL LRG LVL3 (GOWN DISPOSABLE) ×2 IMPLANT
GOWN STRL REUS W/TWL LRG LVL3 (GOWN DISPOSABLE) ×2
GOWN STRL REUS W/TWL XL LVL3 (GOWN DISPOSABLE) ×4 IMPLANT
LOOP VESSEL MAXI BLUE (MISCELLANEOUS) IMPLANT
NEEDLE PRECISIONGLIDE 27X1.5 (NEEDLE) ×4 IMPLANT
NS IRRIG 1000ML POUR BTL (IV SOLUTION) ×4 IMPLANT
PACK BASIN DAY SURGERY FS (CUSTOM PROCEDURE TRAY) ×4 IMPLANT
PAD CAST 3X4 CTTN HI CHSV (CAST SUPPLIES) IMPLANT
PAD CAST 4YDX4 CTTN HI CHSV (CAST SUPPLIES) ×2 IMPLANT
PADDING CAST COTTON 3X4 STRL (CAST SUPPLIES)
PADDING CAST COTTON 4X4 STRL (CAST SUPPLIES) ×2
SLEEVE SCD COMPRESS KNEE MED (MISCELLANEOUS) ×4 IMPLANT
SLING ARM FOAM STRAP MED (SOFTGOODS) ×4 IMPLANT
SPLINT PLASTER CAST XFAST 3X15 (CAST SUPPLIES) IMPLANT
SPLINT PLASTER XTRA FASTSET 3X (CAST SUPPLIES)
STOCKINETTE 4X48 STRL (DRAPES) ×4 IMPLANT
SUT ETHILON 4 0 PS 2 18 (SUTURE) ×4 IMPLANT
SUT VIC AB 2-0 SH 27 (SUTURE) ×2
SUT VIC AB 2-0 SH 27XBRD (SUTURE) ×2 IMPLANT
SUT VICRYL 4-0 PS2 18IN ABS (SUTURE) ×4 IMPLANT
SYR BULB 3OZ (MISCELLANEOUS) ×4 IMPLANT
SYR CONTROL 10ML LL (SYRINGE) ×4 IMPLANT
TOWEL OR 17X24 6PK STRL BLUE (TOWEL DISPOSABLE) ×4 IMPLANT
UNDERPAD 30X30 (UNDERPADS AND DIAPERS) IMPLANT

## 2017-06-17 NOTE — Op Note (Signed)
NAMEDEROTHA, FISHBAUGH NO.:  [id number]  MEDICAL RECORD NO.:  [id number]  LOCATION:                                 FACILITY:  PHYSICIAN:  Daryll Brod, M.D.            DATE OF BIRTH:  DATE OF PROCEDURE:  06/17/2017 DATE OF DISCHARGE:                              OPERATIVE REPORT   PREOPERATIVE DIAGNOSIS:  Carpal tunnels cubital tunnel syndrome, left arm.  POSTOPERATIVE DIAGNOSIS:  Carpal tunnels cubital tunnel syndrome, left arm.  OPERATION:  Decompression of median nerve at the wrist, decompression of ulnar nerve at the left elbow.  SURGEON:  Daryll Brod, M.D.  ANESTHESIA:  General with local infiltration.  PLACE OF SURGERY:  [facility].  ANESTHESIOLOGIST:  Montez Hageman, MD  HISTORY:  The patient is a 61 year old female with a history of numbness and tingling of her left hand.  Nerve conductions reveal carpal tunnel syndrome with cubital tunnel syndrome on her left side.  She has not responded to conservative treatment and has elected to undergo decompression and possible transposition to the ulnar nerve at the elbow.  Decompression to the median nerve at the wrist.  Pre, peri and postoperative course have been discussed along with risks and complications.  She is aware that there is no guarantee to the surgery, the possibility of infection; recurrence of injury to arteries, nerves, tendons; incomplete relief of symptoms and dystrophy.  In preoperative area, the patient was seen, the extremity was marked by both patient and surgeon.  Antibiotic given.  PROCEDURE IN DETAIL:  The patient was brought to the operating room, where general anesthetic was carried out without difficulty.  She was prepped using ChloraPrep in supine position with left arm free.  A 3- minute dry time was allowed and time-out taken, confirming the patient and procedure.  The limb was exsanguinated with an Esmarch bandage. Tourniquet placed high on the arm, was  inflated to 250 mmHg.  A longitudinal incision was made in the left palm, carried down through subcutaneous tissue.  Bleeders were electrocauterized with bipolar.  The palmar fascia was split.  The superficial palmar arch was identified. The flexor tendon to the ring and little finger was identified. Retractors were placed retracting the flexor tendons, median nerve radially and the ulnar nerve ulnarly.  The flexor retinaculum was then incised on its ulnar border.  A right angle and Sewell retractor were placed between skin and forearm fascia.  Blunt dissection was used to separate any underlying tissue from the proximal flexor retinaculum and distal forearm fascia.  Blunt scissors were then used to release the proximal aspect of the flexor retinaculum and distal forearm fascia under direct vision.  This was done for approximately 3 cm proximal to the wrist crease under direct vision.  The canal was explored.  The motor branch entered into muscle distally.  No further lesions were identified.  An area of compression to the nerve was apparent.  The wound was irrigated and closed with interrupted 4-0 nylon sutures.  A separate incision was then made over the medial epicondyle of left elbow.  The dissection was carried down to the [employer]  fascia.  Care was taken to protect the posterior branches of the medial antebrachial cutaneous nerve of the forearm.  The nerve was identified.  Osborne fascia was cut on its posterior border.  A knee retractor was then placed after dissecting the skin and subcutaneous tissue from the flexor carpi ulnaris fascia.  The superficial fascia was then released with blunt scissors.  The muscles separated.  A KMI guide for carpal tunnel release was then placed between the nerve and the deep fascia of the flexor carpi ulnaris and the flexor carpi ulnaris deep fascia was then transected using ENT scissors angled with a straight cut for approximately 8-10 cm proximal  to the elbow.  The brachial fascia was then separated from the overlying skin proximally.  The nerve was identified proximally.  The [facility] guide was placed between the nerve and the brachial fascia.  This was then released with the ENT angled scissors for the same distance proximally.  The elbow was fully flexed and no subluxation of the nerve was noted.  The wounds were copiously irrigated with saline.  The subcutaneous tissue was closed with interrupted 3-0 Vicryl suture.  The skin was closed with interrupted 4-0 nylon sutures.  A local infiltration with 0.25% bupivacaine without epinephrine was given.  Approximately 10 mL total was used for the 2 incisions.  A sterile compressive dressing to each wound was applied. On deflation of the tourniquet, all fingers immediately pinked.  She was taken to the recovery room for observation in satisfactory condition. She will be discharged to home to return to the [facility] in 1 week, on [place].          ______________________________ Daryll Brod, M.D.     GK/MEDQ  D:  06/17/2017  T:  06/17/2017  Job:  [id number]

## 2017-06-17 NOTE — Transfer of Care (Signed)
Immediate Anesthesia Transfer of Care Note  Patient: [name] [name]  Procedure(s) Performed: Procedure(s): CARPAL TUNNEL RELEASE (Left) DECOMPRESSION POSSIBLE TRANSPOSITION ULNAR NERVE LEFT ELBOW (Left)  Patient Location: PACU  Anesthesia Type:General  Level of Consciousness: awake and sedated  Airway & Oxygen Therapy: Patient Spontanous Breathing and Patient connected to face mask oxygen  Post-op Assessment: Report given to RN and Post -op Vital signs reviewed and stable  Post vital signs: Reviewed and stable  Last Vitals:  Vitals:   06/17/17 1040  BP: 119/85  Pulse: 60  Resp: 16  Temp: 36.8 C    Last Pain:  Vitals:   06/17/17 1040  TempSrc: Oral  PainSc: 1       Patients Stated Pain Goal: 3 (11/23/82 4621)  Complications: No apparent anesthesia complications

## 2017-06-17 NOTE — Anesthesia Procedure Notes (Signed)
Procedure Name: LMA Insertion Date/Time: 06/17/2017 12:39 PM Performed by: Denna Haggard D Pre-anesthesia Checklist: Patient identified, Emergency Drugs available, Suction available and Patient being monitored Patient Re-evaluated:Patient Re-evaluated prior to induction Oxygen Delivery Method: Circle system utilized Preoxygenation: Pre-oxygenation with 100% oxygen Induction Type: IV induction Ventilation: Mask ventilation without difficulty LMA: LMA inserted LMA Size: 4.0 Number of attempts: 1 Airway Equipment and Method: Bite block Placement Confirmation: positive ETCO2 Tube secured with: Tape Dental Injury: Teeth and Oropharynx as per pre-operative assessment

## 2017-06-17 NOTE — Anesthesia Preprocedure Evaluation (Addendum)
Anesthesia Evaluation  Patient identified by MRN, date of birth, ID band Patient awake    Reviewed: Allergy & Precautions, NPO status , Patient's Chart, lab work & pertinent test results  Airway Mallampati: II  TM Distance: >3 FB Neck ROM: Full    Dental no notable dental hx. (+) Caps   Pulmonary neg pulmonary ROS, COPD,  COPD inhaler, former smoker,    Pulmonary exam normal breath sounds clear to auscultation       Cardiovascular negative cardio ROS Normal cardiovascular exam Rhythm:Regular Rate:Normal     Neuro/Psych negative neurological ROS  negative psych ROS   GI/Hepatic Neg liver ROS, GERD  Medicated and Controlled,  Endo/Other  Hypothyroidism   Renal/GU negative Renal ROS  negative genitourinary   Musculoskeletal negative musculoskeletal ROS (+)   Abdominal   Peds negative pediatric ROS (+)  Hematology negative hematology ROS (+)   Anesthesia Other Findings   Reproductive/Obstetrics negative OB ROS                            Anesthesia Physical Anesthesia Plan  ASA: III  Anesthesia Plan: General   Post-op Pain Management:    Induction: Intravenous  PONV Risk Score and Plan: 3 and Ondansetron, Dexamethasone, Propofol and Midazolam  Airway Management Planned: LMA  Additional Equipment:   Intra-op Plan:   Post-operative Plan:   Informed Consent: I have reviewed the patients History and Physical, chart, labs and discussed the procedure including the risks, benefits and alternatives for the proposed anesthesia with the patient or authorized representative who has indicated his/her understanding and acceptance.   Dental advisory given  Plan Discussed with: CRNA  Anesthesia Plan Comments: (Severe COPD. Risk of GA vs regional discussed at length. She choses GA)       Anesthesia Quick Evaluation

## 2017-06-17 NOTE — Discharge Instructions (Addendum)
° °  ° ° ° °[facility] Center Instructions °Hand Surgery ° °Wound Care: °Keep your hand elevated above the level of your heart.  Do not allow it to dangle by your side.  Keep the dressing dry and do not remove it unless your doctor advises you to do so.  He will usually change it at the time of your post-op visit.  Moving your fingers is advised to stimulate circulation but will depend on the site of your surgery.  If you have a splint applied, your doctor will advise you regarding movement. ° °Activity: °Do not drive or operate machinery today.  Rest today and then you may return to your normal activity and work as indicated by your physician. ° °Diet:  °Drink liquids today or eat a light diet.  You may resume a regular diet tomorrow.   ° °General expectations: °Pain for two to three days. °Fingers may become slightly swollen. ° °Call your doctor if any of the following occur: °Severe pain not relieved by pain medication. °Elevated temperature. °Dressing soaked with blood. °Inability to move fingers. °White or bluish color to fingers. ° ° °Regional Anesthesia Blocks ° °1. Numbness or the inability to move the "blocked" extremity may last from 3-48 hours after placement. The length of time depends on the medication injected and your individual response to the medication. If the numbness is not going away after 48 hours, call your surgeon. ° °2. The extremity that is blocked will need to be protected until the numbness is gone and the  Strength has returned. Because you cannot feel it, you will need to take extra care to avoid injury. Because it may be weak, you may have difficulty moving it or using it. You may not know what position it is in without looking at it while the block is in effect. ° °3. For blocks in the legs and feet, returning to weight bearing and walking needs to be done carefully. You will need to wait until the numbness is entirely gone and the strength has returned. You should be able to move your leg  and foot normally before you try and bear weight or walk. You will need someone to be with you when you first try to ensure you do not fall and possibly risk injury. ° °4. Bruising and tenderness at the needle site are common side effects and will resolve in a few days. ° °5. Persistent numbness or new problems with movement should be communicated to the surgeon or the [facility] ([phone])/  Surgery Center ([phone]). ° ° ° °Post Anesthesia Home Care Instructions ° °Activity: °Get plenty of rest for the remainder of the day. A responsible individual must stay with you for 24 hours following the procedure.  °For the next 24 hours, DO NOT: °-Drive a car °-Operate machinery °-Drink alcoholic beverages °-Take any medication unless instructed by your physician °-Make any legal decisions or sign important papers. ° °Meals: °Start with liquid foods such as gelatin or soup. Progress to regular foods as tolerated. Avoid greasy, spicy, heavy foods. If nausea and/or vomiting occur, drink only clear liquids until the nausea and/or vomiting subsides. Call your physician if vomiting continues. ° °Special Instructions/Symptoms: °Your throat may feel dry or sore from the anesthesia or the breathing tube placed in your throat during surgery. If this causes discomfort, gargle with warm salt water. The discomfort should disappear within 24 hours. ° °If you had a scopolamine patch placed behind your ear for   the management of post- operative nausea and/or vomiting: ° °1. The medication in the patch is effective for 72 hours, after which it should be removed.  Wrap patch in a tissue and discard in the trash. Wash hands thoroughly with soap and water. °2. You may remove the patch earlier than 72 hours if you experience unpleasant side effects which may include dry mouth, dizziness or visual disturbances. °3. Avoid touching the patch. Wash your hands with soap and water after contact with the patch. °  ° ° °

## 2017-06-17 NOTE — Brief Op Note (Signed)
06/17/2017  1:20 PM  PATIENT:  [name] [name]  61 y.o. female  PRE-OPERATIVE DIAGNOSIS:  CARPAL TUNNEL SYNDROME, CUBITAL TUNNEL SYNDROME  POST-OPERATIVE DIAGNOSIS:  CARPAL TUNNEL SYNDROME, CUBITAL TUNNEL SYNDROME  PROCEDURE:  Procedure(s): CARPAL TUNNEL RELEASE (Left) DECOMPRESSION POSSIBLE TRANSPOSITION ULNAR NERVE LEFT ELBOW (Left)  SURGEON:  Surgeon(s) and Role:    * Daryll Brod, MD - Primary  PHYSICIAN ASSISTANT:   ASSISTANTS: none   ANESTHESIA:   local and general  EBL:  Total I/O In: 800 [I.V.:800] Out: 3 [Blood:3]  BLOOD ADMINISTERED:none  DRAINS: none   LOCAL MEDICATIONS USED:  BUPIVICAINE   SPECIMEN:  No Specimen  DISPOSITION OF SPECIMEN:  N/A  COUNTS:  YES  TOURNIQUET:   Total Tourniquet Time Documented: Upper Arm (Left) - 30 minutes Total: Upper Arm (Left) - 30 minutes   DICTATION: .Other Dictation: Dictation Number [id number]  PLAN OF CARE: Discharge to home after PACU  PATIENT DISPOSITION:  PACU - hemodynamically stable.

## 2017-06-17 NOTE — H&P (Signed)
[name] [name] is an 61 y.o. female.   Chief Complaint: numbness left hand HPI: [name] [name] is a 61 year old right-hand-dominant female referred by Dr. Felipa Eth for consultation regarding numbness and tingling primarily ring and small fingers of her left hand. This been going on for several months. She has not had any definitive treatment for this. She has had nerve conductions done by Dr. Domingo Cocking revealing carpal tunnel cubital tunnel syndrome with significant change of the cubital tunnel ulnar nerve. She has no history of injury to the hand at the neck or to the elbow. She states that it is relatively constant in nature. She relates that on both dorsal palmar aspects of her hand on the ulnar aspect. She has no symptoms on her right side. States that it does not radiate elsewhere. She has a feeling of weakness to it. It does not awaken her at night. She has a here history of thyroid problems she has no history of diabetes arthritis or gout. Family history is negative for diabetes positive thyroid thyroid problems negative for arthritis positive for gout.      Past Medical History:  Diagnosis Date  . Arthritis    right knee, left hip  . Carpal tunnel syndrome of left wrist 05/2017  . COPD, severe ([place])    SOB with ADLs per pt. - no O2  . Cubital tunnel syndrome on left 05/2017  . Dental crowns present    also dental implants  . Esophageal reflux   . Family history of adverse reaction to anesthesia    pt's mother has hx. of agitation and hallucinations post-op  . History of asthma    as a child  . Hypothyroidism (acquired)   . Tachycardia    Holter monitor 06/08/2017 - 06/10/2017:  does not have results yet    Past Surgical History:  Procedure Laterality Date  . BREAST LUMPECTOMY Left   . CATARACT EXTRACTION W/ INTRAOCULAR LENS IMPLANT Left   . MASS EXCISION Left 03/26/2005   thumb  . MULTIPLE TOOTH EXTRACTIONS    . THYROIDECTOMY, PARTIAL    . TONSILLECTOMY      Family History   Problem Relation Age of Onset  . Colon cancer Mother   . Breast cancer Maternal Aunt   . Allergies Brother    Social History:  reports that she quit smoking about 4 years ago. She started smoking about 13 years ago. She smoked 0.00 packs per day for 30.00 years. She has never used smokeless tobacco. She reports that she drinks alcohol. She reports that she does not use drugs.  Allergies:  Allergies  Allergen Reactions  . Peanut-Containing Drug Products Shortness Of Breath    [place] NUTS  . Codeine Other (See Comments)    UNKNOWN    Medications Prior to Admission  Medication Sig Dispense Refill  . albuterol (PROVENTIL HFA;VENTOLIN HFA) 108 (90 Base) MCG/ACT inhaler Inhale into the lungs every 6 (six) hours as needed for wheezing or shortness of breath.    . Calcium 250 MG CAPS Take 500 mg by mouth.    . cholecalciferol (VITAMIN D) 1000 units tablet Take 1,000 Units by mouth daily.    Marland Kitchen levothyroxine (SYNTHROID, LEVOTHROID) 125 MCG tablet Take 125 mcg by mouth daily before breakfast.    . Multiple Vitamin (MULTIVITAMIN) tablet Take 1 tablet by mouth daily.    Marland Kitchen omeprazole (PRILOSEC) 20 MG capsule Take 20 mg by mouth 2 (two) times daily.    . simvastatin (ZOCOR) 20 MG tablet Take 20  mg by mouth every evening.    . tiotropium (SPIRIVA) 18 MCG inhalation capsule Place 18 mcg into inhaler and inhale daily.    . vitamin C (ASCORBIC ACID) 500 MG tablet Take 500 mg by mouth daily.    Marland Kitchen EPINEPHrine (EPIPEN IJ) Inject as directed.      No results found for this or any previous visit (from the past 48 hour(s)).  No results found.   Pertinent items are noted in HPI.  Blood pressure 119/85, pulse 60, temperature 98.2 F (36.8 C), temperature source Oral, resp. rate 16, height 5' 7.5" (1.715 m), weight 81.6 kg (180 lb), SpO2 98 %.  General appearance: alert, cooperative and appears stated age Head: Normocephalic, without obvious abnormality Neck: no JVD Resp: clear to auscultation  bilaterally Cardio: regular rate and rhythm, S1, S2 normal, no murmur, click, rub or gallop GI: soft, non-tender; bowel sounds normal; no masses,  no organomegaly Extremities: numbness left hand Pulses: 2+ and symmetric Skin: Skin color, texture, turgor normal. No rashes or lesions Neurologic: Grossly normal Incision/Wound: na  Assessment/Plan Assessment:  1. 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 Carpal tunnel syndrome of left wrist    Plan: We would recommend carpal tunnel and cubital tunnel release and possible decompression of the ulnar nerve. She is seen with her husband. Pre-peri-and postoperative course are discussed along with risk complications. She is aware that there is no guarantee to the surgery the possibility of infection recurrence injury to arteries nerves tendons incomplete relief of symptoms and dystrophy. She is scheduled for carpal tunnel release cubital tunnel release possible transposition left arm as an outpatient under regional anesthesia. Questions are encouraged and answered to their satisfaction. We have discussed with her that we are attempting to halt the process. Should this not improve her muscle function we may consider an AIN transfer in the future.      KUZMA,GARY R 06/17/2017, 11:07 AM

## 2017-06-17 NOTE — Op Note (Signed)
Dictation Number [id number]

## 2017-06-18 ENCOUNTER — Other Ambulatory Visit: Payer: Self-pay | Admitting: Geriatric Medicine

## 2017-06-18 ENCOUNTER — Encounter (HOSPITAL_BASED_OUTPATIENT_CLINIC_OR_DEPARTMENT_OTHER): Payer: Self-pay | Admitting: Orthopedic Surgery

## 2017-06-18 ENCOUNTER — Encounter (HOSPITAL_COMMUNITY)
Admission: RE | Admit: 2017-06-18 | Discharge: 2017-06-18 | Disposition: A | Discharge status: Home / Self Care | Payer: BLUE CROSS/BLUE SHIELD | Source: Ambulatory Visit | Attending: Geriatric Medicine | Admitting: Geriatric Medicine

## 2017-06-18 DIAGNOSIS — R911 Solitary pulmonary nodule: Secondary | ICD-10-CM | POA: Diagnosis present

## 2017-06-18 LAB — GLUCOSE, CAPILLARY: Glucose-Capillary: 107 mg/dL — ABNORMAL HIGH (ref 65–99)

## 2017-06-18 MED ORDER — FLUDEOXYGLUCOSE F - 18 (FDG) INJECTION
8.9000 | Freq: Once | INTRAVENOUS | Status: AC | PRN
  Administered 2017-06-18: 8.9 via INTRAVENOUS

## 2017-06-18 NOTE — Anesthesia Postprocedure Evaluation (Signed)
Anesthesia Post Note  Patient: Khamryn A Antonacci  Procedure(s) Performed: Procedure(s) (LRB): CARPAL TUNNEL RELEASE (Left) DECOMPRESSION  ULNAR NERVE LEFT ELBOW (Left)     Patient location during evaluation: PACU Anesthesia Type: General Level of consciousness: awake and alert Pain management: pain level controlled Vital Signs Assessment: post-procedure vital signs reviewed and stable Respiratory status: spontaneous breathing, nonlabored ventilation, respiratory function stable and patient connected to nasal cannula oxygen Cardiovascular status: blood pressure returned to baseline and stable Postop Assessment: no signs of nausea or vomiting Anesthetic complications: no    Last Vitals:  Vitals:   06/17/17 1400 06/17/17 1439  BP: (!) 142/83 125/80  Pulse: (!) 59 68  Resp: 12 16  Temp:  36.7 C    Last Pain:  Vitals:   06/17/17 1439  TempSrc:   PainSc: 2                  Catalina Gravel

## 2017-09-03 ENCOUNTER — Ambulatory Visit
Admission: RE | Admit: 2017-09-03 | Discharge: 2017-09-03 | Disposition: A | Discharge status: Home / Self Care | Payer: BLUE CROSS/BLUE SHIELD | Source: Ambulatory Visit | Attending: Geriatric Medicine | Admitting: Geriatric Medicine

## 2017-09-03 DIAGNOSIS — R911 Solitary pulmonary nodule: Secondary | ICD-10-CM

## 2017-10-05 ENCOUNTER — Other Ambulatory Visit: Payer: Self-pay | Admitting: Geriatric Medicine

## 2017-10-05 DIAGNOSIS — Z1231 Encounter for screening mammogram for malignant neoplasm of breast: Secondary | ICD-10-CM

## 2017-11-04 ENCOUNTER — Ambulatory Visit
Admission: RE | Admit: 2017-11-04 | Discharge: 2017-11-04 | Disposition: A | Discharge status: Home / Self Care | Payer: BLUE CROSS/BLUE SHIELD | Source: Ambulatory Visit | Attending: Geriatric Medicine | Admitting: Geriatric Medicine

## 2017-11-04 DIAGNOSIS — Z1231 Encounter for screening mammogram for malignant neoplasm of breast: Secondary | ICD-10-CM

## 2017-11-11 ENCOUNTER — Other Ambulatory Visit: Payer: Self-pay | Admitting: Geriatric Medicine

## 2017-11-11 DIAGNOSIS — I712 Thoracic aortic aneurysm, without rupture, unspecified: Secondary | ICD-10-CM

## 2017-11-18 ENCOUNTER — Ambulatory Visit
Admission: RE | Admit: 2017-11-18 | Discharge: 2017-11-18 | Disposition: A | Discharge status: Home / Self Care | Payer: BLUE CROSS/BLUE SHIELD | Source: Ambulatory Visit | Attending: Geriatric Medicine | Admitting: Geriatric Medicine

## 2017-11-18 DIAGNOSIS — I712 Thoracic aortic aneurysm, without rupture, unspecified: Secondary | ICD-10-CM

## 2017-11-18 MED ORDER — IOPAMIDOL (ISOVUE-370) INJECTION 76%
75.0000 mL | Freq: Once | INTRAVENOUS | Status: AC | PRN
Start: 2017-11-18 — End: 2017-11-18
  Administered 2017-11-18: 75 mL via INTRAVENOUS

## 2018-05-04 ENCOUNTER — Other Ambulatory Visit: Payer: Self-pay | Admitting: Geriatric Medicine

## 2018-05-04 DIAGNOSIS — I712 Thoracic aortic aneurysm, without rupture, unspecified: Secondary | ICD-10-CM

## 2018-05-13 ENCOUNTER — Other Ambulatory Visit: Payer: BLUE CROSS/BLUE SHIELD

## 2018-05-23 ENCOUNTER — Ambulatory Visit
Admission: RE | Admit: 2018-05-23 | Discharge: 2018-05-23 | Disposition: A | Discharge status: Home / Self Care | Payer: BLUE CROSS/BLUE SHIELD | Source: Ambulatory Visit | Attending: Geriatric Medicine | Admitting: Geriatric Medicine

## 2018-05-23 DIAGNOSIS — I712 Thoracic aortic aneurysm, without rupture, unspecified: Secondary | ICD-10-CM

## 2018-05-23 MED ORDER — IOPAMIDOL (ISOVUE-370) INJECTION 76%
75.0000 mL | Freq: Once | INTRAVENOUS | Status: AC | PRN
  Administered 2018-05-23: 75 mL via INTRAVENOUS

## 2018-09-27 ENCOUNTER — Other Ambulatory Visit: Payer: Self-pay | Admitting: Geriatric Medicine

## 2018-09-27 DIAGNOSIS — Z1231 Encounter for screening mammogram for malignant neoplasm of breast: Secondary | ICD-10-CM

## 2018-11-08 ENCOUNTER — Ambulatory Visit
Admission: RE | Admit: 2018-11-08 | Discharge: 2018-11-08 | Disposition: A | Discharge status: Home / Self Care | Payer: BLUE CROSS/BLUE SHIELD | Source: Ambulatory Visit | Attending: Geriatric Medicine | Admitting: Geriatric Medicine

## 2018-11-08 DIAGNOSIS — Z1231 Encounter for screening mammogram for malignant neoplasm of breast: Secondary | ICD-10-CM

## 2018-12-14 ENCOUNTER — Other Ambulatory Visit: Payer: Self-pay | Admitting: Geriatric Medicine

## 2018-12-14 DIAGNOSIS — I712 Thoracic aortic aneurysm, without rupture, unspecified: Secondary | ICD-10-CM

## 2018-12-19 ENCOUNTER — Ambulatory Visit
Admission: RE | Admit: 2018-12-19 | Discharge: 2018-12-19 | Disposition: A | Discharge status: Home / Self Care | Payer: BLUE CROSS/BLUE SHIELD | Source: Ambulatory Visit | Attending: Geriatric Medicine | Admitting: Geriatric Medicine

## 2018-12-19 DIAGNOSIS — I712 Thoracic aortic aneurysm, without rupture, unspecified: Secondary | ICD-10-CM

## 2018-12-19 MED ORDER — IOPAMIDOL (ISOVUE-370) INJECTION 76%
75.0000 mL | Freq: Once | INTRAVENOUS | Status: AC | PRN
  Administered 2018-12-19: 75 mL via INTRAVENOUS

## 2019-01-26 ENCOUNTER — Other Ambulatory Visit: Payer: Self-pay | Admitting: Obstetrics and Gynecology

## 2019-01-26 DIAGNOSIS — M8580 Other specified disorders of bone density and structure, unspecified site: Secondary | ICD-10-CM

## 2019-04-27 ENCOUNTER — Other Ambulatory Visit: Payer: BLUE CROSS/BLUE SHIELD

## 2019-04-28 ENCOUNTER — Other Ambulatory Visit: Payer: Self-pay | Admitting: Geriatric Medicine

## 2019-04-28 DIAGNOSIS — R911 Solitary pulmonary nodule: Secondary | ICD-10-CM

## 2019-05-15 ENCOUNTER — Ambulatory Visit
Admission: RE | Admit: 2019-05-15 | Discharge: 2019-05-15 | Disposition: A | Discharge status: Home / Self Care | Payer: BLUE CROSS/BLUE SHIELD | Source: Ambulatory Visit | Attending: Geriatric Medicine | Admitting: Geriatric Medicine

## 2019-05-15 DIAGNOSIS — R911 Solitary pulmonary nodule: Secondary | ICD-10-CM

## 2019-05-15 DIAGNOSIS — I712 Thoracic aortic aneurysm, without rupture: Secondary | ICD-10-CM | POA: Diagnosis not present

## 2019-05-24 DIAGNOSIS — E039 Hypothyroidism, unspecified: Secondary | ICD-10-CM | POA: Diagnosis not present

## 2019-06-16 ENCOUNTER — Other Ambulatory Visit: Payer: Self-pay

## 2019-06-16 ENCOUNTER — Ambulatory Visit
Admission: RE | Admit: 2019-06-16 | Discharge: 2019-06-16 | Disposition: A | Discharge status: Home / Self Care | Payer: PPO | Source: Ambulatory Visit | Attending: Obstetrics and Gynecology | Admitting: Obstetrics and Gynecology

## 2019-06-16 DIAGNOSIS — M8588 Other specified disorders of bone density and structure, other site: Secondary | ICD-10-CM | POA: Diagnosis not present

## 2019-06-16 DIAGNOSIS — Z780 Asymptomatic menopausal state: Secondary | ICD-10-CM | POA: Diagnosis not present

## 2019-06-16 DIAGNOSIS — M8580 Other specified disorders of bone density and structure, unspecified site: Secondary | ICD-10-CM

## 2019-07-20 DIAGNOSIS — J449 Chronic obstructive pulmonary disease, unspecified: Secondary | ICD-10-CM | POA: Diagnosis not present

## 2019-07-20 DIAGNOSIS — R634 Abnormal weight loss: Secondary | ICD-10-CM | POA: Diagnosis not present

## 2019-07-20 DIAGNOSIS — E039 Hypothyroidism, unspecified: Secondary | ICD-10-CM | POA: Diagnosis not present

## 2019-08-15 DIAGNOSIS — R634 Abnormal weight loss: Secondary | ICD-10-CM | POA: Diagnosis not present

## 2019-08-15 DIAGNOSIS — M859 Disorder of bone density and structure, unspecified: Secondary | ICD-10-CM | POA: Diagnosis not present

## 2019-08-15 DIAGNOSIS — E890 Postprocedural hypothyroidism: Secondary | ICD-10-CM | POA: Diagnosis not present

## 2019-08-18 DIAGNOSIS — E785 Hyperlipidemia, unspecified: Secondary | ICD-10-CM | POA: Diagnosis not present

## 2019-08-18 DIAGNOSIS — J984 Other disorders of lung: Secondary | ICD-10-CM | POA: Diagnosis not present

## 2019-08-18 DIAGNOSIS — E890 Postprocedural hypothyroidism: Secondary | ICD-10-CM | POA: Diagnosis not present

## 2019-08-18 DIAGNOSIS — I712 Thoracic aortic aneurysm, without rupture: Secondary | ICD-10-CM | POA: Diagnosis not present

## 2019-08-18 DIAGNOSIS — R634 Abnormal weight loss: Secondary | ICD-10-CM | POA: Diagnosis not present

## 2019-08-18 DIAGNOSIS — J449 Chronic obstructive pulmonary disease, unspecified: Secondary | ICD-10-CM | POA: Diagnosis not present

## 2019-08-18 DIAGNOSIS — M8580 Other specified disorders of bone density and structure, unspecified site: Secondary | ICD-10-CM | POA: Diagnosis not present

## 2019-09-04 DIAGNOSIS — Z1159 Encounter for screening for other viral diseases: Secondary | ICD-10-CM | POA: Diagnosis not present

## 2019-09-07 DIAGNOSIS — D123 Benign neoplasm of transverse colon: Secondary | ICD-10-CM | POA: Diagnosis not present

## 2019-09-07 DIAGNOSIS — K640 First degree hemorrhoids: Secondary | ICD-10-CM | POA: Diagnosis not present

## 2019-09-07 DIAGNOSIS — Z86010 Personal history of colonic polyps: Secondary | ICD-10-CM | POA: Diagnosis not present

## 2019-09-07 DIAGNOSIS — K5730 Diverticulosis of large intestine without perforation or abscess without bleeding: Secondary | ICD-10-CM | POA: Diagnosis not present

## 2019-09-07 DIAGNOSIS — K635 Polyp of colon: Secondary | ICD-10-CM | POA: Diagnosis not present

## 2019-09-07 DIAGNOSIS — Z9889 Other specified postprocedural states: Secondary | ICD-10-CM | POA: Diagnosis not present

## 2019-09-12 DIAGNOSIS — D123 Benign neoplasm of transverse colon: Secondary | ICD-10-CM | POA: Diagnosis not present

## 2019-09-12 DIAGNOSIS — K635 Polyp of colon: Secondary | ICD-10-CM | POA: Diagnosis not present

## 2019-10-31 ENCOUNTER — Other Ambulatory Visit: Payer: Self-pay | Admitting: Geriatric Medicine

## 2019-10-31 DIAGNOSIS — Z1231 Encounter for screening mammogram for malignant neoplasm of breast: Secondary | ICD-10-CM

## 2019-11-15 ENCOUNTER — Other Ambulatory Visit: Payer: Self-pay | Admitting: Geriatric Medicine

## 2019-11-15 DIAGNOSIS — I712 Thoracic aortic aneurysm, without rupture, unspecified: Secondary | ICD-10-CM

## 2019-11-15 DIAGNOSIS — R911 Solitary pulmonary nodule: Secondary | ICD-10-CM

## 2019-12-05 ENCOUNTER — Ambulatory Visit
Admission: RE | Admit: 2019-12-05 | Discharge: 2019-12-05 | Disposition: A | Discharge status: Home / Self Care | Payer: PPO | Source: Ambulatory Visit | Attending: Geriatric Medicine | Admitting: Geriatric Medicine

## 2019-12-05 DIAGNOSIS — I712 Thoracic aortic aneurysm, without rupture, unspecified: Secondary | ICD-10-CM

## 2019-12-05 DIAGNOSIS — R911 Solitary pulmonary nodule: Secondary | ICD-10-CM

## 2019-12-14 ENCOUNTER — Ambulatory Visit: Payer: Medicare Other | Attending: Internal Medicine

## 2019-12-14 DIAGNOSIS — Z23 Encounter for immunization: Secondary | ICD-10-CM

## 2019-12-14 NOTE — Progress Notes (Signed)
   Covid-19 Vaccination Clinic  Name:  [name] [name]    MRN: [id number] DOB: 1956/05/23  12/14/2019  [name] [name] was observed post Covid-19 immunization for 30 minutes based on pre-vaccination screening without incidence. She was provided with Vaccine Information Sheet and instruction to access the V-Safe system.   [name] [name] was instructed to call 911 with any severe reactions post vaccine: Marland Kitchen Difficulty breathing  . Swelling of your face and throat  . A fast heartbeat  . A bad rash all over your body  . Dizziness and weakness    Immunizations Administered    Name Date Dose VIS Date Route   Pfizer COVID-19 Vaccine 12/14/2019  9:14 AM 0.3 mL 11/10/2019 Intramuscular   Manufacturer: [place]   Lot: [id number]   [place]: [id number]

## 2019-12-22 ENCOUNTER — Other Ambulatory Visit: Payer: Self-pay

## 2019-12-22 ENCOUNTER — Ambulatory Visit
Admission: RE | Admit: 2019-12-22 | Discharge: 2019-12-22 | Disposition: A | Discharge status: Home / Self Care | Payer: PPO | Source: Ambulatory Visit | Attending: Geriatric Medicine | Admitting: Geriatric Medicine

## 2019-12-22 DIAGNOSIS — Z1231 Encounter for screening mammogram for malignant neoplasm of breast: Secondary | ICD-10-CM

## 2019-12-29 ENCOUNTER — Other Ambulatory Visit: Payer: Self-pay | Admitting: Geriatric Medicine

## 2019-12-29 DIAGNOSIS — R911 Solitary pulmonary nodule: Secondary | ICD-10-CM

## 2020-01-01 ENCOUNTER — Ambulatory Visit: Payer: PPO | Attending: Internal Medicine

## 2020-01-01 ENCOUNTER — Ambulatory Visit: Payer: PPO

## 2020-01-01 DIAGNOSIS — Z23 Encounter for immunization: Secondary | ICD-10-CM

## 2020-01-01 NOTE — Progress Notes (Signed)
   Covid-19 Vaccination Clinic  Name:  [name] [name]    MRN: [id number] DOB: Sep 08, 1956  01/01/2020  [name] was observed post Covid-19 immunization for 15 minutes without incidence. She was provided with Vaccine Information Sheet and instruction to access the V-Safe system.   [name] was instructed to call 911 with any severe reactions post vaccine: Marland Kitchen Difficulty breathing  . Swelling of your face and throat  . A fast heartbeat  . A bad rash all over your body  . Dizziness and weakness    Immunizations Administered    Name Date Dose VIS Date Route   Pfizer COVID-19 Vaccine 01/01/2020 11:21 AM 0.3 mL 11/10/2019 Intramuscular   Manufacturer: [place]   Lot: [id number]   [place]: [id number]

## 2020-01-02 DIAGNOSIS — D1801 Hemangioma of skin and subcutaneous tissue: Secondary | ICD-10-CM | POA: Diagnosis not present

## 2020-01-02 DIAGNOSIS — L820 Inflamed seborrheic keratosis: Secondary | ICD-10-CM | POA: Diagnosis not present

## 2020-01-02 DIAGNOSIS — L821 Other seborrheic keratosis: Secondary | ICD-10-CM | POA: Diagnosis not present

## 2020-01-02 DIAGNOSIS — L720 Epidermal cyst: Secondary | ICD-10-CM | POA: Diagnosis not present

## 2020-01-02 DIAGNOSIS — D224 Melanocytic nevi of scalp and neck: Secondary | ICD-10-CM | POA: Diagnosis not present

## 2020-01-05 DIAGNOSIS — K5901 Slow transit constipation: Secondary | ICD-10-CM | POA: Diagnosis not present

## 2020-01-05 DIAGNOSIS — J449 Chronic obstructive pulmonary disease, unspecified: Secondary | ICD-10-CM | POA: Diagnosis not present

## 2020-01-05 DIAGNOSIS — M6780 Other specified disorders of synovium and tendon, unspecified site: Secondary | ICD-10-CM | POA: Diagnosis not present

## 2020-01-05 DIAGNOSIS — E7800 Pure hypercholesterolemia, unspecified: Secondary | ICD-10-CM | POA: Diagnosis not present

## 2020-01-05 DIAGNOSIS — E559 Vitamin D deficiency, unspecified: Secondary | ICD-10-CM | POA: Diagnosis not present

## 2020-01-05 DIAGNOSIS — E039 Hypothyroidism, unspecified: Secondary | ICD-10-CM | POA: Diagnosis not present

## 2020-01-05 DIAGNOSIS — K9049 Malabsorption due to intolerance, not elsewhere classified: Secondary | ICD-10-CM | POA: Diagnosis not present

## 2020-01-05 DIAGNOSIS — Z79899 Other long term (current) drug therapy: Secondary | ICD-10-CM | POA: Diagnosis not present

## 2020-01-05 DIAGNOSIS — R911 Solitary pulmonary nodule: Secondary | ICD-10-CM | POA: Diagnosis not present

## 2020-01-05 DIAGNOSIS — I712 Thoracic aortic aneurysm, without rupture: Secondary | ICD-10-CM | POA: Diagnosis not present

## 2020-01-05 DIAGNOSIS — I700 Atherosclerosis of aorta: Secondary | ICD-10-CM | POA: Diagnosis not present

## 2020-01-05 DIAGNOSIS — Z1389 Encounter for screening for other disorder: Secondary | ICD-10-CM | POA: Diagnosis not present

## 2020-01-05 DIAGNOSIS — Z0000 Encounter for general adult medical examination without abnormal findings: Secondary | ICD-10-CM | POA: Diagnosis not present

## 2020-02-29 DIAGNOSIS — E039 Hypothyroidism, unspecified: Secondary | ICD-10-CM | POA: Diagnosis not present

## 2020-03-06 ENCOUNTER — Ambulatory Visit
Admission: RE | Admit: 2020-03-06 | Discharge: 2020-03-06 | Disposition: A | Discharge status: Home / Self Care | Payer: Medicare Other | Source: Ambulatory Visit | Attending: Geriatric Medicine | Admitting: Geriatric Medicine

## 2020-03-06 DIAGNOSIS — R911 Solitary pulmonary nodule: Secondary | ICD-10-CM | POA: Diagnosis not present

## 2020-03-06 DIAGNOSIS — J449 Chronic obstructive pulmonary disease, unspecified: Secondary | ICD-10-CM | POA: Diagnosis not present

## 2020-03-07 ENCOUNTER — Other Ambulatory Visit: Payer: Self-pay | Admitting: Geriatric Medicine

## 2020-03-07 DIAGNOSIS — R911 Solitary pulmonary nodule: Secondary | ICD-10-CM

## 2020-03-11 DIAGNOSIS — J342 Deviated nasal septum: Secondary | ICD-10-CM | POA: Diagnosis not present

## 2020-03-11 DIAGNOSIS — H9190 Unspecified hearing loss, unspecified ear: Secondary | ICD-10-CM | POA: Diagnosis not present

## 2020-03-11 DIAGNOSIS — H903 Sensorineural hearing loss, bilateral: Secondary | ICD-10-CM | POA: Diagnosis not present

## 2020-03-11 DIAGNOSIS — H9313 Tinnitus, bilateral: Secondary | ICD-10-CM | POA: Diagnosis not present

## 2020-03-11 DIAGNOSIS — K219 Gastro-esophageal reflux disease without esophagitis: Secondary | ICD-10-CM | POA: Diagnosis not present

## 2020-03-11 DIAGNOSIS — R0989 Other specified symptoms and signs involving the circulatory and respiratory systems: Secondary | ICD-10-CM | POA: Diagnosis not present

## 2020-04-03 DIAGNOSIS — E7800 Pure hypercholesterolemia, unspecified: Secondary | ICD-10-CM | POA: Diagnosis not present

## 2020-04-03 DIAGNOSIS — E039 Hypothyroidism, unspecified: Secondary | ICD-10-CM | POA: Diagnosis not present

## 2020-04-03 DIAGNOSIS — J449 Chronic obstructive pulmonary disease, unspecified: Secondary | ICD-10-CM | POA: Diagnosis not present

## 2020-05-22 DIAGNOSIS — E039 Hypothyroidism, unspecified: Secondary | ICD-10-CM | POA: Diagnosis not present

## 2020-06-20 DIAGNOSIS — J449 Chronic obstructive pulmonary disease, unspecified: Secondary | ICD-10-CM | POA: Diagnosis not present

## 2020-06-20 DIAGNOSIS — E039 Hypothyroidism, unspecified: Secondary | ICD-10-CM | POA: Diagnosis not present

## 2020-06-20 DIAGNOSIS — E7800 Pure hypercholesterolemia, unspecified: Secondary | ICD-10-CM | POA: Diagnosis not present

## 2020-07-01 DIAGNOSIS — S40862A Insect bite (nonvenomous) of left upper arm, initial encounter: Secondary | ICD-10-CM | POA: Diagnosis not present

## 2020-07-12 DIAGNOSIS — I712 Thoracic aortic aneurysm, without rupture: Secondary | ICD-10-CM | POA: Diagnosis not present

## 2020-07-12 DIAGNOSIS — J449 Chronic obstructive pulmonary disease, unspecified: Secondary | ICD-10-CM | POA: Diagnosis not present

## 2020-07-12 DIAGNOSIS — E039 Hypothyroidism, unspecified: Secondary | ICD-10-CM | POA: Diagnosis not present

## 2020-08-20 DIAGNOSIS — J449 Chronic obstructive pulmonary disease, unspecified: Secondary | ICD-10-CM | POA: Diagnosis not present

## 2020-08-20 DIAGNOSIS — I2510 Atherosclerotic heart disease of native coronary artery without angina pectoris: Secondary | ICD-10-CM | POA: Diagnosis not present

## 2020-08-20 DIAGNOSIS — R911 Solitary pulmonary nodule: Secondary | ICD-10-CM | POA: Diagnosis not present

## 2020-08-20 DIAGNOSIS — E039 Hypothyroidism, unspecified: Secondary | ICD-10-CM | POA: Diagnosis not present

## 2020-08-20 DIAGNOSIS — M8580 Other specified disorders of bone density and structure, unspecified site: Secondary | ICD-10-CM | POA: Diagnosis not present

## 2020-08-20 DIAGNOSIS — I712 Thoracic aortic aneurysm, without rupture: Secondary | ICD-10-CM | POA: Diagnosis not present

## 2020-08-20 DIAGNOSIS — I2584 Coronary atherosclerosis due to calcified coronary lesion: Secondary | ICD-10-CM | POA: Diagnosis not present

## 2020-08-20 DIAGNOSIS — I700 Atherosclerosis of aorta: Secondary | ICD-10-CM | POA: Diagnosis not present

## 2020-08-20 DIAGNOSIS — E785 Hyperlipidemia, unspecified: Secondary | ICD-10-CM | POA: Diagnosis not present

## 2020-08-20 DIAGNOSIS — E890 Postprocedural hypothyroidism: Secondary | ICD-10-CM | POA: Diagnosis not present

## 2020-08-26 DIAGNOSIS — J449 Chronic obstructive pulmonary disease, unspecified: Secondary | ICD-10-CM | POA: Diagnosis not present

## 2020-08-26 DIAGNOSIS — E7800 Pure hypercholesterolemia, unspecified: Secondary | ICD-10-CM | POA: Diagnosis not present

## 2020-08-26 DIAGNOSIS — E039 Hypothyroidism, unspecified: Secondary | ICD-10-CM | POA: Diagnosis not present

## 2020-09-05 DIAGNOSIS — D3131 Benign neoplasm of right choroid: Secondary | ICD-10-CM | POA: Diagnosis not present

## 2020-09-05 DIAGNOSIS — D3130 Benign neoplasm of unspecified choroid: Secondary | ICD-10-CM | POA: Diagnosis not present

## 2020-09-05 DIAGNOSIS — H2513 Age-related nuclear cataract, bilateral: Secondary | ICD-10-CM | POA: Diagnosis not present

## 2020-09-05 DIAGNOSIS — H26492 Other secondary cataract, left eye: Secondary | ICD-10-CM | POA: Diagnosis not present

## 2020-09-05 DIAGNOSIS — H25013 Cortical age-related cataract, bilateral: Secondary | ICD-10-CM | POA: Diagnosis not present

## 2020-10-02 DIAGNOSIS — E7800 Pure hypercholesterolemia, unspecified: Secondary | ICD-10-CM | POA: Diagnosis not present

## 2020-10-02 DIAGNOSIS — K219 Gastro-esophageal reflux disease without esophagitis: Secondary | ICD-10-CM | POA: Diagnosis not present

## 2020-10-02 DIAGNOSIS — J449 Chronic obstructive pulmonary disease, unspecified: Secondary | ICD-10-CM | POA: Diagnosis not present

## 2020-10-02 DIAGNOSIS — E039 Hypothyroidism, unspecified: Secondary | ICD-10-CM | POA: Diagnosis not present

## 2020-10-22 DIAGNOSIS — H2511 Age-related nuclear cataract, right eye: Secondary | ICD-10-CM | POA: Diagnosis not present

## 2020-10-22 DIAGNOSIS — Z961 Presence of intraocular lens: Secondary | ICD-10-CM | POA: Diagnosis not present

## 2020-10-22 DIAGNOSIS — H26492 Other secondary cataract, left eye: Secondary | ICD-10-CM | POA: Diagnosis not present

## 2020-10-22 DIAGNOSIS — H18413 Arcus senilis, bilateral: Secondary | ICD-10-CM | POA: Diagnosis not present

## 2020-10-29 DIAGNOSIS — H26492 Other secondary cataract, left eye: Secondary | ICD-10-CM | POA: Diagnosis not present

## 2020-11-13 ENCOUNTER — Other Ambulatory Visit: Payer: Self-pay | Admitting: Geriatric Medicine

## 2020-11-13 DIAGNOSIS — Z1231 Encounter for screening mammogram for malignant neoplasm of breast: Secondary | ICD-10-CM

## 2020-11-25 DIAGNOSIS — H2511 Age-related nuclear cataract, right eye: Secondary | ICD-10-CM | POA: Diagnosis not present

## 2020-12-11 DIAGNOSIS — E7800 Pure hypercholesterolemia, unspecified: Secondary | ICD-10-CM | POA: Diagnosis not present

## 2020-12-11 DIAGNOSIS — I10 Essential (primary) hypertension: Secondary | ICD-10-CM | POA: Diagnosis not present

## 2020-12-11 DIAGNOSIS — J449 Chronic obstructive pulmonary disease, unspecified: Secondary | ICD-10-CM | POA: Diagnosis not present

## 2020-12-11 DIAGNOSIS — K219 Gastro-esophageal reflux disease without esophagitis: Secondary | ICD-10-CM | POA: Diagnosis not present

## 2020-12-11 DIAGNOSIS — E039 Hypothyroidism, unspecified: Secondary | ICD-10-CM | POA: Diagnosis not present

## 2020-12-24 ENCOUNTER — Ambulatory Visit
Admission: RE | Admit: 2020-12-24 | Discharge: 2020-12-24 | Disposition: A | Discharge status: Home / Self Care | Payer: PPO | Source: Ambulatory Visit | Attending: Geriatric Medicine | Admitting: Geriatric Medicine

## 2020-12-24 ENCOUNTER — Other Ambulatory Visit: Payer: Self-pay

## 2020-12-24 DIAGNOSIS — Z1231 Encounter for screening mammogram for malignant neoplasm of breast: Secondary | ICD-10-CM | POA: Diagnosis not present

## 2021-01-01 ENCOUNTER — Other Ambulatory Visit: Payer: Self-pay | Admitting: Geriatric Medicine

## 2021-01-01 ENCOUNTER — Ambulatory Visit
Admission: RE | Admit: 2021-01-01 | Discharge: 2021-01-01 | Disposition: A | Discharge status: Home / Self Care | Payer: PPO | Source: Ambulatory Visit | Attending: Geriatric Medicine | Admitting: Geriatric Medicine

## 2021-01-01 DIAGNOSIS — Z79899 Other long term (current) drug therapy: Secondary | ICD-10-CM | POA: Diagnosis not present

## 2021-01-01 DIAGNOSIS — R1031 Right lower quadrant pain: Secondary | ICD-10-CM

## 2021-01-01 DIAGNOSIS — M1611 Unilateral primary osteoarthritis, right hip: Secondary | ICD-10-CM | POA: Diagnosis not present

## 2021-01-01 DIAGNOSIS — R1030 Lower abdominal pain, unspecified: Secondary | ICD-10-CM | POA: Diagnosis not present

## 2021-01-01 DIAGNOSIS — I712 Thoracic aortic aneurysm, without rupture: Secondary | ICD-10-CM | POA: Diagnosis not present

## 2021-01-01 DIAGNOSIS — I10 Essential (primary) hypertension: Secondary | ICD-10-CM | POA: Diagnosis not present

## 2021-01-01 DIAGNOSIS — R5383 Other fatigue: Secondary | ICD-10-CM | POA: Diagnosis not present

## 2021-01-01 DIAGNOSIS — M545 Low back pain, unspecified: Secondary | ICD-10-CM | POA: Diagnosis not present

## 2021-01-02 DIAGNOSIS — L720 Epidermal cyst: Secondary | ICD-10-CM | POA: Diagnosis not present

## 2021-01-02 DIAGNOSIS — L308 Other specified dermatitis: Secondary | ICD-10-CM | POA: Diagnosis not present

## 2021-01-02 DIAGNOSIS — D1801 Hemangioma of skin and subcutaneous tissue: Secondary | ICD-10-CM | POA: Diagnosis not present

## 2021-01-02 DIAGNOSIS — L820 Inflamed seborrheic keratosis: Secondary | ICD-10-CM | POA: Diagnosis not present

## 2021-01-02 DIAGNOSIS — L821 Other seborrheic keratosis: Secondary | ICD-10-CM | POA: Diagnosis not present

## 2021-01-21 DIAGNOSIS — J449 Chronic obstructive pulmonary disease, unspecified: Secondary | ICD-10-CM | POA: Diagnosis not present

## 2021-01-21 DIAGNOSIS — Z79899 Other long term (current) drug therapy: Secondary | ICD-10-CM | POA: Diagnosis not present

## 2021-01-21 DIAGNOSIS — I10 Essential (primary) hypertension: Secondary | ICD-10-CM | POA: Diagnosis not present

## 2021-01-21 DIAGNOSIS — R600 Localized edema: Secondary | ICD-10-CM | POA: Diagnosis not present

## 2021-01-21 DIAGNOSIS — I712 Thoracic aortic aneurysm, without rupture: Secondary | ICD-10-CM | POA: Diagnosis not present

## 2021-01-21 DIAGNOSIS — Z1389 Encounter for screening for other disorder: Secondary | ICD-10-CM | POA: Diagnosis not present

## 2021-01-21 DIAGNOSIS — E7800 Pure hypercholesterolemia, unspecified: Secondary | ICD-10-CM | POA: Diagnosis not present

## 2021-01-21 DIAGNOSIS — I700 Atherosclerosis of aorta: Secondary | ICD-10-CM | POA: Diagnosis not present

## 2021-01-21 DIAGNOSIS — K9049 Malabsorption due to intolerance, not elsewhere classified: Secondary | ICD-10-CM | POA: Diagnosis not present

## 2021-01-21 DIAGNOSIS — E039 Hypothyroidism, unspecified: Secondary | ICD-10-CM | POA: Diagnosis not present

## 2021-01-21 DIAGNOSIS — Z0000 Encounter for general adult medical examination without abnormal findings: Secondary | ICD-10-CM | POA: Diagnosis not present

## 2021-01-21 DIAGNOSIS — K219 Gastro-esophageal reflux disease without esophagitis: Secondary | ICD-10-CM | POA: Diagnosis not present

## 2021-01-22 DIAGNOSIS — M79661 Pain in right lower leg: Secondary | ICD-10-CM | POA: Diagnosis not present

## 2021-01-22 DIAGNOSIS — R600 Localized edema: Secondary | ICD-10-CM | POA: Diagnosis not present

## 2021-01-22 DIAGNOSIS — M79604 Pain in right leg: Secondary | ICD-10-CM | POA: Diagnosis not present

## 2021-02-03 DIAGNOSIS — I10 Essential (primary) hypertension: Secondary | ICD-10-CM | POA: Diagnosis not present

## 2021-02-03 DIAGNOSIS — Z01411 Encounter for gynecological examination (general) (routine) with abnormal findings: Secondary | ICD-10-CM | POA: Diagnosis not present

## 2021-02-03 DIAGNOSIS — Z124 Encounter for screening for malignant neoplasm of cervix: Secondary | ICD-10-CM | POA: Diagnosis not present

## 2021-02-03 DIAGNOSIS — E039 Hypothyroidism, unspecified: Secondary | ICD-10-CM | POA: Diagnosis not present

## 2021-02-03 DIAGNOSIS — Z6829 Body mass index (BMI) 29.0-29.9, adult: Secondary | ICD-10-CM | POA: Diagnosis not present

## 2021-02-03 DIAGNOSIS — Z01419 Encounter for gynecological examination (general) (routine) without abnormal findings: Secondary | ICD-10-CM | POA: Diagnosis not present

## 2021-02-11 DIAGNOSIS — E7800 Pure hypercholesterolemia, unspecified: Secondary | ICD-10-CM | POA: Diagnosis not present

## 2021-02-11 DIAGNOSIS — J449 Chronic obstructive pulmonary disease, unspecified: Secondary | ICD-10-CM | POA: Diagnosis not present

## 2021-02-11 DIAGNOSIS — K219 Gastro-esophageal reflux disease without esophagitis: Secondary | ICD-10-CM | POA: Diagnosis not present

## 2021-02-11 DIAGNOSIS — I10 Essential (primary) hypertension: Secondary | ICD-10-CM | POA: Diagnosis not present

## 2021-02-11 DIAGNOSIS — E039 Hypothyroidism, unspecified: Secondary | ICD-10-CM | POA: Diagnosis not present

## 2021-03-03 ENCOUNTER — Other Ambulatory Visit: Payer: Self-pay | Admitting: Geriatric Medicine

## 2021-03-03 DIAGNOSIS — I712 Thoracic aortic aneurysm, without rupture, unspecified: Secondary | ICD-10-CM

## 2021-03-19 ENCOUNTER — Ambulatory Visit
Admission: RE | Admit: 2021-03-19 | Discharge: 2021-03-19 | Disposition: A | Discharge status: Home / Self Care | Payer: PPO | Source: Ambulatory Visit | Attending: Geriatric Medicine | Admitting: Geriatric Medicine

## 2021-03-19 DIAGNOSIS — I712 Thoracic aortic aneurysm, without rupture, unspecified: Secondary | ICD-10-CM

## 2021-03-19 DIAGNOSIS — I2510 Atherosclerotic heart disease of native coronary artery without angina pectoris: Secondary | ICD-10-CM | POA: Diagnosis not present

## 2021-03-19 DIAGNOSIS — J439 Emphysema, unspecified: Secondary | ICD-10-CM | POA: Diagnosis not present

## 2021-03-19 DIAGNOSIS — I700 Atherosclerosis of aorta: Secondary | ICD-10-CM | POA: Diagnosis not present

## 2021-03-21 DIAGNOSIS — Z124 Encounter for screening for malignant neoplasm of cervix: Secondary | ICD-10-CM | POA: Diagnosis not present

## 2021-04-02 DIAGNOSIS — E7800 Pure hypercholesterolemia, unspecified: Secondary | ICD-10-CM | POA: Diagnosis not present

## 2021-04-02 DIAGNOSIS — I10 Essential (primary) hypertension: Secondary | ICD-10-CM | POA: Diagnosis not present

## 2021-04-02 DIAGNOSIS — E039 Hypothyroidism, unspecified: Secondary | ICD-10-CM | POA: Diagnosis not present

## 2021-04-02 DIAGNOSIS — J449 Chronic obstructive pulmonary disease, unspecified: Secondary | ICD-10-CM | POA: Diagnosis not present

## 2021-04-02 DIAGNOSIS — K219 Gastro-esophageal reflux disease without esophagitis: Secondary | ICD-10-CM | POA: Diagnosis not present

## 2021-04-10 ENCOUNTER — Encounter: Payer: PPO | Admitting: Cardiothoracic Surgery

## 2021-05-01 ENCOUNTER — Institutional Professional Consult (permissible substitution): Payer: PPO | Admitting: Cardiothoracic Surgery

## 2021-05-01 ENCOUNTER — Other Ambulatory Visit: Payer: Self-pay

## 2021-05-01 ENCOUNTER — Other Ambulatory Visit: Payer: Self-pay | Admitting: *Deleted

## 2021-05-01 ENCOUNTER — Encounter: Payer: Self-pay | Admitting: Cardiothoracic Surgery

## 2021-05-01 VITALS — BP 126/85 | HR 76 | Resp 20 | Ht 67.5 in | Wt 194.0 lb

## 2021-05-01 DIAGNOSIS — I712 Thoracic aortic aneurysm, without rupture, unspecified: Secondary | ICD-10-CM | POA: Insufficient documentation

## 2021-05-01 DIAGNOSIS — I7121 Aneurysm of the ascending aorta, without rupture: Secondary | ICD-10-CM

## 2021-05-01 NOTE — Progress Notes (Signed)
[address]       [place] [address]             [phone]     CARDIOTHORACIC SURGERY CONSULTATION REPORT  Referring Provider is Lajean Manes, MD Primary Cardiologist is None PCP is Lajean Manes, MD  Chief Complaint  Patient presents with  . Thoracic Aortic Aneurysm    Surgical consult, CT Chest 03/19/21    HPI:  50 several lady is referred for consideration of surgical repair of ascending aortic aneurysm which was incidentally discovered during a noncontrast low-dose chest CT to evaluate for pulmonary nodules.  This was first done over a year ago.  Her lung nodules have since resolved.  The initial CT suggested an ascending aortic aneurysm measuring approximately 46 to 47 mm in diameter.  I scan a couple of months ago suggest closer to the 4.9 cm range.  She has no personal history of aneurysms.  She does have a history of smoking and some suggestion of connective tissue disorders including arthritis.  Her father had several aneurysms including an ascending aneurysm.  She denies any chest pains or back pain.  Past Medical History:  Diagnosis Date  . Arthritis    right knee, left hip  . Carpal tunnel syndrome of left wrist 05/2017  . COPD, severe ([place])    SOB with ADLs per pt. - no O2  . Cubital tunnel syndrome on left 05/2017  . Dental crowns present    also dental implants  . Esophageal reflux   . Family history of adverse reaction to anesthesia    pt's mother has hx. of agitation and hallucinations post-op  . History of asthma    as a child  . Hypothyroidism (acquired)   . Tachycardia    Holter monitor 06/08/2017 - 06/10/2017:  does not have results yet    Past Surgical History:  Procedure Laterality Date  . BREAST LUMPECTOMY Left   . CARPAL TUNNEL RELEASE Left 06/17/2017   Procedure: CARPAL TUNNEL RELEASE;  Surgeon: Daryll Brod, MD;  Location: [facility];  Service: Orthopedics;  Laterality: Left;  . CATARACT EXTRACTION W/  INTRAOCULAR LENS IMPLANT Left   . MASS EXCISION Left 03/26/2005   thumb  . MULTIPLE TOOTH EXTRACTIONS    . THYROIDECTOMY, PARTIAL    . TONSILLECTOMY    . ULNAR NERVE TRANSPOSITION Left 06/17/2017   Procedure: DECOMPRESSION  ULNAR NERVE LEFT ELBOW;  Surgeon: Daryll Brod, MD;  Location: [facility];  Service: Orthopedics;  Laterality: Left;    Family History  Problem Relation Age of Onset  . Colon cancer Mother   . Breast cancer Maternal Aunt   . Allergies Brother     Social History   Socioeconomic History  . Marital status: Married    Spouse name: Not on file  . Number of children: Not on file  . Years of education: Not on file  . Highest education level: Not on file  Occupational History  . Occupation: Government social research officer for [employer]    Employer: [employer]  Tobacco Use  . Smoking status: Former Smoker    Packs/day: 0.00    Years: 30.00    Pack years: 0.00    Start date: 12/01/2003    Quit date: 01/06/2013    Years since quitting: 8.3  . Smokeless tobacco: Never Used  Vaping Use  . Vaping Use: Never used  Substance and Sexual Activity  . Alcohol use: Yes  Comment: 2 beers per week  . Drug use: No  . Sexual activity: Not on file  Other Topics Concern  . Not on file  Social History Narrative  . Not on file   Social Determinants of Health   Financial Resource Strain: Not on file  Food Insecurity: Not on file  Transportation Needs: Not on file  Physical Activity: Not on file  Stress: Not on file  Social Connections: Not on file  Intimate Partner Violence: Not on file    Current Outpatient Medications  Medication Sig Dispense Refill  . cholecalciferol (VITAMIN D) 1000 units tablet Take 1,000 Units by mouth daily.    Marland Kitchen EPINEPHrine (EPIPEN IJ) Inject as directed.    Marland Kitchen losartan (COZAAR) 25 MG tablet Take 25 mg by mouth daily.    . Multiple Vitamin (MULTIVITAMIN) tablet Take 1 tablet by mouth daily.    Marland Kitchen omeprazole (PRILOSEC) 20 MG capsule Take 20  mg by mouth 2 (two) times daily.     No current facility-administered medications for this visit.    Allergies  Allergen Reactions  . Peanut-Containing Drug Products Shortness Of Breath    [place] NUTS  . Other Other (See Comments)  . Codeine Other (See Comments)    UNKNOWN      Review of Systems:   General:  Reduced energy, endorses weight gain  Cardiac:  No chest pain palpitations or shortness of breath with exertion  Respiratory:  History of emphysema and smoking with 30 pack years  GI:   History of reflux and constipation last colonoscopy 2019  GU:   Occasional dysuria  Vascular:  Occasional right lower extremity swelling; denies claudication  Neuro:   No history of strokes or TIAs   Musculoskeletal: Bilateral hip arthritis  Skin:   Negative  Psych:   Negative  Eyes:   wears glasses   ENT:   Last saw dentist 3 months ago  Hematologic:  Negative  Endocrine:  No diabetes or thyroid disorders     Physical Exam:   BP 126/85 (BP Location: Left Arm, Patient Position: Sitting, Cuff Size: Large)   Pulse 76   Resp 20   Ht 5' 7.5" (1.715 m)   Wt 88 kg   SpO2 95% Comment: RA  BMI 29.94 kg/m   General:    well-appearing  HEENT:  Unremarkable   Neck:   no JVD, no bruits, mild supraclavicular left neck fullness without shoddy lymphadenopathy  Chest:   clear to auscultation, symmetrical breath sounds, end expiratory wheezes, no rhonchi   CV:   RRR, no  murmur   Abdomen:  soft, non-tender, no masses   Extremities:  warm, well-perfused, pulses intact, no LE edema  Rectal/GU  Deferred  Neuro:   Grossly non-focal and symmetrical throughout  Skin:   Clean and dry, no rashes, no breakdown   Diagnostic Tests:  I have personally reviewed her available imaging studies including the CT chest from 4/21 and from 4/22.  There may be slight interval growth of the ascending aortic aneurysm over the year observed.   Impression:  65 year old lady with family history of aneurysms and  a 4.9 cm ascending aortic aneurysm herself.  She is interested in prospective evaluation to get this repaired electively.  It would be important to make sure that her lung function is optimized given her underlying emphysema.   Plan:  Refer to pulmonary rehab for optimized pulmonary function prior to any planned surgery Continue to observe strict blood pressure control Follow-up in 3  months with repeat noncontrast chest CT "follow-up aneurysm" Echocardiogram in 3 months to assess aortic valve morphology and heart function in anticipation of aneurysm repair   I spent in excess of 45 minutes during the conduct of this office consultation and >50% of this time involved direct face-to-face encounter with the patient for counseling and/or coordination of their care.          Level 3 Office Consult = 40 minutes         Level 4 Office Consult = 60 minutes         Level 5 Office Consult = 80 minutes  B. Murvin Natal, MD 05/01/2021 2:53 PM

## 2021-05-14 ENCOUNTER — Other Ambulatory Visit: Payer: Self-pay | Admitting: *Deleted

## 2021-05-14 DIAGNOSIS — I712 Thoracic aortic aneurysm, without rupture, unspecified: Secondary | ICD-10-CM

## 2021-05-15 ENCOUNTER — Encounter (HOSPITAL_COMMUNITY): Payer: Self-pay | Admitting: *Deleted

## 2021-05-15 NOTE — Progress Notes (Signed)
Received referral from Dr.Atkins for this pt to participate in pulmonary rehab with the the diagnosis of Emphysema. Clinical review of pt follow up appt on 6/2 surgical consult office note.  Pt has 4.9 cm Aortic aneurysm. BP parameters provided - BP less than 90 @ rest Diastolic and less than 146 systolic on exertion. Pt with Covid Risk Score - 2. Pt appropriate for scheduling for Pulmonary rehab.  Will forward to support staff for scheduling when able as there is a long wait list,  Dr. Orvan Seen is aware and verification of insurance eligibility/benefits with pt consent. Cherre Huger, BSN Cardiac and Training and development officer

## 2021-05-20 ENCOUNTER — Emergency Department (HOSPITAL_BASED_OUTPATIENT_CLINIC_OR_DEPARTMENT_OTHER)
Admission: EM | Admit: 2021-05-20 | Discharge: 2021-05-20 | Disposition: A | Discharge status: Home / Self Care | Payer: PPO | Attending: Emergency Medicine | Admitting: Emergency Medicine

## 2021-05-20 ENCOUNTER — Encounter (HOSPITAL_BASED_OUTPATIENT_CLINIC_OR_DEPARTMENT_OTHER): Payer: Self-pay | Admitting: Obstetrics and Gynecology

## 2021-05-20 ENCOUNTER — Emergency Department (HOSPITAL_BASED_OUTPATIENT_CLINIC_OR_DEPARTMENT_OTHER): Payer: PPO | Admitting: Radiology

## 2021-05-20 ENCOUNTER — Other Ambulatory Visit: Payer: Self-pay

## 2021-05-20 DIAGNOSIS — E039 Hypothyroidism, unspecified: Secondary | ICD-10-CM | POA: Insufficient documentation

## 2021-05-20 DIAGNOSIS — Z91010 Allergy to peanuts: Secondary | ICD-10-CM | POA: Diagnosis not present

## 2021-05-20 DIAGNOSIS — Z87891 Personal history of nicotine dependence: Secondary | ICD-10-CM | POA: Diagnosis not present

## 2021-05-20 DIAGNOSIS — Z79899 Other long term (current) drug therapy: Secondary | ICD-10-CM | POA: Diagnosis not present

## 2021-05-20 DIAGNOSIS — Z20822 Contact with and (suspected) exposure to covid-19: Secondary | ICD-10-CM | POA: Insufficient documentation

## 2021-05-20 DIAGNOSIS — Z7951 Long term (current) use of inhaled steroids: Secondary | ICD-10-CM | POA: Diagnosis not present

## 2021-05-20 DIAGNOSIS — J441 Chronic obstructive pulmonary disease with (acute) exacerbation: Secondary | ICD-10-CM | POA: Insufficient documentation

## 2021-05-20 DIAGNOSIS — R911 Solitary pulmonary nodule: Secondary | ICD-10-CM | POA: Diagnosis not present

## 2021-05-20 DIAGNOSIS — J439 Emphysema, unspecified: Secondary | ICD-10-CM | POA: Diagnosis not present

## 2021-05-20 DIAGNOSIS — J449 Chronic obstructive pulmonary disease, unspecified: Secondary | ICD-10-CM | POA: Diagnosis not present

## 2021-05-20 DIAGNOSIS — R0602 Shortness of breath: Secondary | ICD-10-CM | POA: Diagnosis not present

## 2021-05-20 LAB — BASIC METABOLIC PANEL
Anion gap: 9 (ref 5–15)
BUN: 11 mg/dL (ref 8–23)
CO2: 26 mmol/L (ref 22–32)
Calcium: 8.9 mg/dL (ref 8.9–10.3)
Chloride: 102 mmol/L (ref 98–111)
Creatinine, Ser: 0.74 mg/dL (ref 0.44–1.00)
GFR, Estimated: >60 mL/min (ref >60)
Glucose, Bld: 95 mg/dL (ref 70–99)
Potassium: 3.5 mmol/L (ref 3.5–5.1)
Sodium: 137 mmol/L (ref 135–145)

## 2021-05-20 LAB — CBC WITH DIFFERENTIAL/PLATELET
Abs Immature Granulocytes: 0.02 10*3/uL (ref 0.00–0.07)
Basophils Absolute: 0 10*3/uL (ref 0.0–0.1)
Basophils Relative: 1 %
Eosinophils Absolute: 0.6 10*3/uL — ABNORMAL HIGH (ref 0.0–0.5)
Eosinophils Relative: 7 %
HCT: 38.5 % (ref 36.0–46.0)
Hemoglobin: 12.6 g/dL (ref 12.0–15.0)
Immature Granulocytes: 0 %
Lymphocytes Relative: 34 %
Lymphs Abs: 2.9 10*3/uL (ref 0.7–4.0)
MCH: 31.6 pg (ref 26.0–34.0)
MCHC: 32.7 g/dL (ref 30.0–36.0)
MCV: 96.5 fL (ref 80.0–100.0)
Monocytes Absolute: 0.7 10*3/uL (ref 0.1–1.0)
Monocytes Relative: 8 %
Neutro Abs: 4.2 10*3/uL (ref 1.7–7.7)
Neutrophils Relative %: 50 %
Platelets: 316 10*3/uL (ref 150–400)
RBC: 3.99 MIL/uL (ref 3.87–5.11)
RDW: 12.7 % (ref 11.5–15.5)
WBC: 8.5 10*3/uL (ref 4.0–10.5)
nRBC: 0 % (ref 0.0–0.2)

## 2021-05-20 LAB — BRAIN NATRIURETIC PEPTIDE: B Natriuretic Peptide: 77.9 pg/mL (ref 0.0–100.0)

## 2021-05-20 LAB — RESP PANEL BY RT-PCR (FLU A&B, COVID) ARPGX2
Influenza A by PCR: NEGATIVE
Influenza B by PCR: NEGATIVE
SARS Coronavirus 2 by RT PCR: NEGATIVE

## 2021-05-20 LAB — TROPONIN I (HIGH SENSITIVITY): Troponin I (High Sensitivity): 4 ng/L (ref <18)

## 2021-05-20 MED ORDER — PREDNISONE 20 MG PO TABS: ORAL_TABLET | ORAL | 0 refills | Status: DC

## 2021-05-20 MED ORDER — METHYLPREDNISOLONE SODIUM SUCC 125 MG IJ SOLR
125.0000 mg | Freq: Once | INTRAMUSCULAR | Status: AC
  Administered 2021-05-20: 125 mg via INTRAVENOUS
  Filled 2021-05-20: qty 2

## 2021-05-20 MED ORDER — MAGNESIUM SULFATE 2 GM/50ML IV SOLN
2.0000 g | Freq: Once | INTRAVENOUS | Status: AC
  Administered 2021-05-20: 2 g via INTRAVENOUS
  Filled 2021-05-20: qty 50

## 2021-05-20 MED ORDER — ALBUTEROL SULFATE (2.5 MG/3ML) 0.083% IN NEBU
5.0000 mg | INHALATION_SOLUTION | Freq: Once | RESPIRATORY_TRACT | Status: AC
  Administered 2021-05-20: 5 mg via RESPIRATORY_TRACT

## 2021-05-20 MED ORDER — IPRATROPIUM-ALBUTEROL 0.5-2.5 (3) MG/3ML IN SOLN
3.0000 mL | Freq: Once | RESPIRATORY_TRACT | Status: AC
  Administered 2021-05-20: 3 mL via RESPIRATORY_TRACT

## 2021-05-20 NOTE — ED Triage Notes (Signed)
Patient reports to the ER for SOB x3 days. Patient has a hx of COPD. Patient denies chest pain, N/V/D.

## 2021-05-20 NOTE — ED Provider Notes (Signed)
[facility] Provider Note   CSN: [id number] Arrival date & time: 05/20/21  1252     History Chief Complaint  Patient presents with   Shortness of Breath    [name] [name] is a 65 y.o. female.  65 yo F with a chief complaints of shortness of breath.  Going on for about 3 or 4 days.  Patient also having some cough and sputum production.  Typically does not have issues with her COPD.  Usually is controlled well with Spiriva.  Very rarely has to use her albuterol.  Has been using it every 4 hours for the past couple days without improvement and so came here for evaluation.  Her chest does feel somewhat tight but denies overt pain.  No fevers at home.  The history is provided by the patient.  Shortness of Breath Associated symptoms: cough   Associated symptoms: no chest pain, no fever, no headaches, no vomiting and no wheezing       Past Medical History:  Diagnosis Date   Arthritis    right knee, left hip   Carpal tunnel syndrome of left wrist 05/2017   COPD, severe (HCC)    SOB with ADLs per pt. - no O2   Cubital tunnel syndrome on left 05/2017   Dental crowns present    also dental implants   Esophageal reflux    Family history of adverse reaction to anesthesia    pt's mother has hx. of agitation and hallucinations post-op   History of asthma    as a child   Hypothyroidism (acquired)    Tachycardia    Holter monitor 06/08/2017 - 06/10/2017:  does not have results yet    Patient Active Problem List   Diagnosis Date Noted   Thoracic aortic aneurysm without rupture ([place]) 05/01/2021   Tachycardia, unspecified 06/08/2017   GERD (gastroesophageal reflux disease) 09/18/2012   COPD (chronic obstructive pulmonary disease) ([place]) 09/16/2012    Past Surgical History:  Procedure Laterality Date   BREAST LUMPECTOMY Left    CARPAL TUNNEL RELEASE Left 06/17/2017   Procedure: CARPAL TUNNEL RELEASE;  Surgeon: Daryll Brod, MD;  Location: [facility];  Service: Orthopedics;  Laterality: Left;   CATARACT EXTRACTION W/ INTRAOCULAR LENS IMPLANT Left    MASS EXCISION Left 03/26/2005   thumb   MULTIPLE TOOTH EXTRACTIONS     THYROIDECTOMY, PARTIAL     TONSILLECTOMY     ULNAR NERVE TRANSPOSITION Left 06/17/2017   Procedure: DECOMPRESSION  ULNAR NERVE LEFT ELBOW;  Surgeon: Daryll Brod, MD;  Location: [facility];  Service: Orthopedics;  Laterality: Left;     OB History     Gravida  0   Para  0   Term  0   Preterm  0   AB  0   Living  0      SAB  0   IAB  0   Ectopic  0   Multiple  0   Live Births  0           Family History  Problem Relation Age of Onset   Colon cancer Mother    Breast cancer Maternal Aunt    Allergies Brother     Social History   Tobacco Use   Smoking status: Former    Packs/day: 0.00    Years: 30.00    Pack years: 0.00    Types: Cigarettes    Start date: 12/01/2003    Quit date: 01/06/2013  Years since quitting: 8.3   Smokeless tobacco: Never  Vaping Use   Vaping Use: Never used  Substance Use Topics   Alcohol use: Yes    Comment: 2 beers per week   Drug use: No    Home Medications Prior to Admission medications   Medication Sig Start Date End Date Taking? Authorizing Provider  albuterol (VENTOLIN HFA) 108 (90 Base) MCG/ACT inhaler Inhale 2 puffs into the lungs every 4 (four) hours as needed for wheezing or shortness of breath.   Yes [provider]  cholecalciferol (VITAMIN D) 1000 units tablet Take 1,000 Units by mouth daily.   Yes [provider]  losartan (COZAAR) 25 MG tablet Take 25 mg by mouth daily.   Yes [provider]  Multiple Vitamin (MULTIVITAMIN) tablet Take 1 tablet by mouth daily.   Yes [provider]  omeprazole (PRILOSEC) 20 MG capsule Take 20 mg by mouth 2 (two) times daily.   Yes [provider]  predniSONE (DELTASONE) 20 MG tablet 2 tabs po daily x 4 days 05/20/21  Yes Deno Etienne, DO   simvastatin (ZOCOR) 40 MG tablet Take 40 mg by mouth at bedtime. 05/04/21  Yes [provider]  SPIRIVA RESPIMAT 2.5 MCG/ACT AERS Inhale 2.5 mg into the lungs in the morning. 05/13/21  Yes [provider]  SYNTHROID 100 MCG tablet Take 100 mcg by mouth daily. 04/11/21  Yes [provider]    Allergies    Peanut-containing drug products, Other, and Codeine  Review of Systems   Review of Systems  Constitutional:  Negative for chills and fever.  HENT:  Negative for congestion and rhinorrhea.   Eyes:  Negative for redness and visual disturbance.  Respiratory:  Positive for cough and shortness of breath. Negative for wheezing.   Cardiovascular:  Negative for chest pain and palpitations.  Gastrointestinal:  Negative for nausea and vomiting.  Genitourinary:  Negative for dysuria and urgency.  Musculoskeletal:  Negative for arthralgias and myalgias.  Skin:  Negative for pallor and wound.  Neurological:  Negative for dizziness and headaches.   Physical Exam Updated Vital Signs BP 132/85   Pulse 83   Temp 98.7 F (37.1 C)   Resp 18   Ht 5' 7.5" (1.715 m)   Wt 88 kg   SpO2 90%   BMI 29.94 kg/m   Physical Exam Vitals and nursing note reviewed.  Constitutional:      General: She is not in acute distress.    Appearance: She is well-developed. She is not diaphoretic.  HENT:     Head: Normocephalic and atraumatic.  Eyes:     Pupils: Pupils are equal, round, and reactive to light.  Cardiovascular:     Rate and Rhythm: Normal rate and regular rhythm.     Heart sounds: No murmur heard.   No friction rub. No gallop.  Pulmonary:     Effort: Pulmonary effort is normal.     Breath sounds: Wheezing present. No rales.     Comments: Diffuse wheezes with prolonged expiratory effort Abdominal:     General: There is no distension.     Palpations: Abdomen is soft.     Tenderness: There is no abdominal tenderness.  Musculoskeletal:        General: No tenderness.      Cervical back: Normal range of motion and neck supple.  Skin:    General: Skin is warm and dry.  Neurological:     Mental Status: She is alert and oriented  to person, place, and time.  Psychiatric:        Behavior: Behavior normal.    ED Results / Procedures / Treatments   Labs (all labs ordered are listed, but only abnormal results are displayed) Labs Reviewed  CBC WITH DIFFERENTIAL/PLATELET - Abnormal; Notable for the following components:      Result Value   Eosinophils Absolute 0.6 (*)    All other components within normal limits  RESP PANEL BY RT-PCR (FLU A&B, COVID) ARPGX2  BASIC METABOLIC PANEL  BRAIN NATRIURETIC PEPTIDE  TROPONIN I (HIGH SENSITIVITY)    EKG None  Radiology DG Chest 2 View  Result Date: 05/20/2021 CLINICAL DATA:  Shortness of breath.  COPD. EXAM: CHEST - 2 VIEW COMPARISON:  Chest CT 03/19/2021.  Chest x-ray 08/03/2012 FINDINGS: Lungs are hyperexpanded. Interstitial markings are diffusely coarsened with chronic features. Bullous changes noted in the lung apices. Pulmonary nodule in the right lung apex may be related to scarring seen on the previous CT. The cardiopericardial silhouette is within normal limits for size. Bones are diffusely demineralized. IMPRESSION: 1. Emphysema without acute cardiopulmonary findings. 2. Pulmonary nodule in the right lung apex likely reflects scarring seen on previous CT. Follow-up chest x-ray in 3 months recommended to ensure stability. Electronically Signed   By: Misty Stanley M.D.   On: 05/20/2021 14:19    Procedures Procedures   Medications Ordered in ED Medications  albuterol (PROVENTIL) (2.5 MG/3ML) 0.083% nebulizer solution 5 mg (5 mg Nebulization Given 05/20/21 1307)  ipratropium-albuterol (DUONEB) 0.5-2.5 (3) MG/3ML nebulizer solution 3 mL (3 mLs Nebulization Given 05/20/21 1307)  methylPREDNISolone sodium succinate (SOLU-MEDROL) 125 mg/2 mL injection 125 mg (125 mg Intravenous Given 05/20/21 1316)  magnesium sulfate  IVPB 2 g 50 mL (0 g Intravenous Stopped 05/20/21 1335)    ED Course  I have reviewed the triage vital signs and the nursing notes.  Pertinent labs & imaging results that were available during my care of the patient were reviewed by me and considered in my medical decision making (see chart for details).    MDM Rules/Calculators/A&P                          65 yo F with a chief complaints of cough increased sputum production and shortness of breath.  Most likely COPD exacerbation.  Will attempt to treat aggressively here.  Chest x-ray blood work.  Reassess.  Chest x-ray without focal infiltrate.  Patient has signs of COPD.  Lung nodule likely similar to prior.  Discussed results with patient is to follow-up with her family doctor.  Patient is feeling much better after breathing treatment steroids magnesium.  Would like to go home.  Blood work with a negative troponin BNP normal no significant electrolyte abnormality no significant anemia.  We will have her follow-up with her pulmonologist and PCP.  3:39 PM:  I have discussed the diagnosis/risks/treatment options with the patient and believe the pt to be eligible for discharge home to follow-up with PCP. We also discussed returning to the ED immediately if new or worsening sx occur. We discussed the sx which are most concerning (e.g., sudden worsening pain, fever, inability to tolerate by mouth) that necessitate immediate return. Medications administered to the patient during their visit and any new prescriptions provided to the patient are listed below.  Medications given during this visit Medications  albuterol (PROVENTIL) (2.5 MG/3ML) 0.083% nebulizer solution 5 mg (5 mg Nebulization Given 05/20/21 1307)  ipratropium-albuterol (DUONEB)  0.5-2.5 (3) MG/3ML nebulizer solution 3 mL (3 mLs Nebulization Given 05/20/21 1307)  methylPREDNISolone sodium succinate (SOLU-MEDROL) 125 mg/2 mL injection 125 mg (125 mg Intravenous Given 05/20/21 1316)   magnesium sulfate IVPB 2 g 50 mL (0 g Intravenous Stopped 05/20/21 1335)     The patient appears reasonably screen and/or stabilized for discharge and I doubt any other medical condition or other [facility] requiring further screening, evaluation, or treatment in the ED at this time prior to discharge.   Final Clinical Impression(s) / ED Diagnoses Final diagnoses:  COPD exacerbation ([place])    Rx / DC Orders ED Discharge Orders          Ordered    predniSONE (DELTASONE) 20 MG tablet        05/20/21 [place], [place], DO 05/20/21 1539

## 2021-05-20 NOTE — ED Notes (Signed)
Patient verbalizes understanding of discharge instructions. Opportunity for questioning and answers were provided. Patient discharged from ED.  °

## 2021-05-20 NOTE — Discharge Instructions (Addendum)
Use your inhaler every 4 hours(6 puffs) while awake, return for sudden worsening shortness of breath, or if you need to use your inhaler more often.  ° °

## 2021-05-27 DIAGNOSIS — J449 Chronic obstructive pulmonary disease, unspecified: Secondary | ICD-10-CM | POA: Diagnosis not present

## 2021-05-27 DIAGNOSIS — I10 Essential (primary) hypertension: Secondary | ICD-10-CM | POA: Diagnosis not present

## 2021-06-06 DIAGNOSIS — K219 Gastro-esophageal reflux disease without esophagitis: Secondary | ICD-10-CM | POA: Diagnosis not present

## 2021-06-06 DIAGNOSIS — J449 Chronic obstructive pulmonary disease, unspecified: Secondary | ICD-10-CM | POA: Diagnosis not present

## 2021-06-06 DIAGNOSIS — I10 Essential (primary) hypertension: Secondary | ICD-10-CM | POA: Diagnosis not present

## 2021-06-06 DIAGNOSIS — E039 Hypothyroidism, unspecified: Secondary | ICD-10-CM | POA: Diagnosis not present

## 2021-06-06 DIAGNOSIS — E7800 Pure hypercholesterolemia, unspecified: Secondary | ICD-10-CM | POA: Diagnosis not present

## 2021-06-17 ENCOUNTER — Encounter (HOSPITAL_COMMUNITY): Payer: Self-pay

## 2021-06-17 ENCOUNTER — Telehealth (HOSPITAL_COMMUNITY): Payer: Self-pay

## 2021-06-17 NOTE — Telephone Encounter (Signed)
Pt insurance is active and benefits verified through [employer]. Co-pay $15.00, DED $0.00/$0.00 met, out of pocket $3,450.00/$319.46 met, co-insurance 0%. No pre-authorization required. [name]/[employer], 06/17/21 @ 2:22PM, JSC#[id number]   Will contact patient to see if she is interested in the Pulmonary Rehab Program.

## 2021-06-17 NOTE — Telephone Encounter (Signed)
Attempted to contact pt in regards to Pulmonary Rehab. LMTCB   Mailed letter

## 2021-06-17 NOTE — Telephone Encounter (Signed)
Pt returned PR phone call and stated she is interested in PR. Explained scheduling process and went over insurance, patient verbalized understanding. Also adv pt where we are with scheduling for PR and that we have a backlog. 1-3 months

## 2021-06-25 DIAGNOSIS — H35372 Puckering of macula, left eye: Secondary | ICD-10-CM | POA: Diagnosis not present

## 2021-07-02 ENCOUNTER — Other Ambulatory Visit: Payer: Self-pay | Admitting: *Deleted

## 2021-07-02 DIAGNOSIS — I712 Thoracic aortic aneurysm, without rupture, unspecified: Secondary | ICD-10-CM

## 2021-07-11 ENCOUNTER — Telehealth (HOSPITAL_COMMUNITY): Payer: Self-pay

## 2021-07-11 ENCOUNTER — Encounter (HOSPITAL_COMMUNITY): Payer: Self-pay

## 2021-07-11 NOTE — Telephone Encounter (Signed)
Attempted to contact pt in regards to Pulmonary Rehab. LM on VM  Mailed letter 

## 2021-07-11 NOTE — Telephone Encounter (Signed)
Pt returned PR phone call and stated she is interested. She will come in for orientation on 08/15/21 @ 10:30AM and will attend the 10:15AM class.   Mailed letter

## 2021-07-22 DIAGNOSIS — J449 Chronic obstructive pulmonary disease, unspecified: Secondary | ICD-10-CM | POA: Diagnosis not present

## 2021-07-22 DIAGNOSIS — M791 Myalgia, unspecified site: Secondary | ICD-10-CM | POA: Diagnosis not present

## 2021-08-14 ENCOUNTER — Telehealth (HOSPITAL_COMMUNITY): Payer: Self-pay | Admitting: *Deleted

## 2021-08-15 ENCOUNTER — Other Ambulatory Visit: Payer: Self-pay

## 2021-08-15 ENCOUNTER — Encounter (HOSPITAL_COMMUNITY): Payer: Self-pay

## 2021-08-15 ENCOUNTER — Other Ambulatory Visit: Payer: Self-pay | Admitting: *Deleted

## 2021-08-15 ENCOUNTER — Encounter (HOSPITAL_COMMUNITY)
Admission: RE | Admit: 2021-08-15 | Discharge: 2021-08-15 | Disposition: A | Discharge status: Home / Self Care | Payer: PPO | Source: Ambulatory Visit | Attending: Surgery | Admitting: Surgery

## 2021-08-15 VITALS — BP 136/80 | HR 70 | Resp 22 | Ht 67.5 in | Wt 196.9 lb

## 2021-08-15 DIAGNOSIS — I712 Thoracic aortic aneurysm, without rupture, unspecified: Secondary | ICD-10-CM

## 2021-08-15 DIAGNOSIS — J438 Other emphysema: Secondary | ICD-10-CM | POA: Diagnosis not present

## 2021-08-15 NOTE — Progress Notes (Signed)
Cardiac Individual Treatment Plan  Patient Details  Name: [name] [name] MRN: [id number] Date of Birth: 07/11/1956 Referring Provider:   [name] Pulmonary Rehab Walk Test from 08/15/2021 in [place]  Referring Provider [name], MD       Initial Encounter Date:  Flowsheet Row Pulmonary Rehab Walk Test from 08/15/2021 in [place]  Date 08/15/21       Visit Diagnosis: Other emphysema ([place])  Patient's Home Medications on Admission:  Current Outpatient Medications:    albuterol (VENTOLIN HFA) 108 (90 Base) MCG/ACT inhaler, Inhale 2 puffs into the lungs every 4 (four) hours as needed for wheezing or shortness of breath., Disp: , Rfl:    cholecalciferol (VITAMIN D) 1000 units tablet, Take 1,000 Units by mouth daily., Disp: , Rfl:    losartan (COZAAR) 25 MG tablet, Take 25 mg by mouth daily., Disp: , Rfl:    Multiple Vitamin (MULTIVITAMIN) tablet, Take 1 tablet by mouth daily., Disp: , Rfl:    omeprazole (PRILOSEC) 20 MG capsule, Take 20 mg by mouth 2 (two) times daily., Disp: , Rfl:    simvastatin (ZOCOR) 40 MG tablet, Take 40 mg by mouth at bedtime., Disp: , Rfl:    SPIRIVA RESPIMAT 2.5 MCG/ACT AERS, Inhale 2.5 mg into the lungs in the morning., Disp: , Rfl:    SYNTHROID 100 MCG tablet, Take 100 mcg by mouth daily., Disp: , Rfl:   Past Medical History: Past Medical History:  Diagnosis Date   Arthritis    right knee, left hip   Carpal tunnel syndrome of left wrist 05/2017   COPD, severe (HCC)    SOB with ADLs per pt. - no O2   Cubital tunnel syndrome on left 05/2017   Dental crowns present    also dental implants   Esophageal reflux    Family history of adverse reaction to anesthesia    pt's mother has hx. of agitation and hallucinations post-op   History of asthma    as a child   Hypothyroidism (acquired)    Tachycardia    Holter monitor 06/08/2017 - 06/10/2017:  does not have results yet     Tobacco Use: Social History   Tobacco Use  Smoking Status Former   Packs/day: 0.00   Years: 30.00   Pack years: 0.00   Types: Cigarettes   Start date: 12/01/2003   Quit date: 01/06/2013   Years since quitting: 8.6  Smokeless Tobacco Never    Labs: Recent Review Flowsheet Data   There is no flowsheet data to display.     Capillary Blood Glucose: Lab Results  Component Value Date   GLUCAP 107 (H) 06/18/2017     Exercise Target Goals: Exercise Program Goal: Individual exercise prescription set using results from initial 6 min walk test and THRR while considering  patient's activity barriers and safety.   Exercise Prescription Goal: Initial exercise prescription builds to 30-45 minutes a day of aerobic activity, 2-3 days per week.  Home exercise guidelines will be given to patient during program as part of exercise prescription that the participant will acknowledge.  Activity Barriers & Risk Stratification:  Activity Barriers & Cardiac Risk Stratification - 08/15/21 1009       Activity Barriers & Cardiac Risk Stratification   Activity Barriers Other (comment);Arthritis;Deconditioning    Comments Sciatic and left hip arthritis    Cardiac Risk Stratification High             6 Minute  Walk:  6 Minute Walk     Row Name 08/15/21 1100         6 Minute Walk   Phase Initial     Distance 1000 feet     Walk Time 6 minutes     # of Rest Breaks 2  Break 1: 2:30 -4:00 and 5:35 - 6:00     MPH 1.9     METS 3.3     RPE 14     Perceived Dyspnea  2     VO2 Peak 11.5     Symptoms Yes (comment)     Comments SOB, RPD = 2     Resting HR 70 bpm     Resting BP 136/80     Resting Oxygen Saturation  97 %     Exercise Oxygen Saturation  during 6 min walk 85 %  See interval oxygen readings     Max Ex. HR 133 bpm     Max Ex. BP 190/90     2 Minute Post BP 162/90  4 - 5 min BP 128/84           Interval HR   1 Minute HR 98     2 Minute HR 115     3 Minute HR 114     4  Minute HR 110     5 Minute HR 124     6 Minute HR 133     2 Minute Post HR 93     Interval Heart Rate? Yes           Interval Oxygen   Interval Oxygen? Yes     1 Minute Oxygen Saturation % 95 %     1 Minute Liters of Oxygen 0 L     2 Minute Oxygen Saturation % 86 %     2 Minute Liters of Oxygen 0 L     3 Minute Oxygen Saturation % 89 %     3 Minute Liters of Oxygen 0 L     4 Minute Oxygen Saturation % 88 %     4 Minute Liters of Oxygen 0 L     5 Minute Oxygen Saturation % 92 %     5 Minute Liters of Oxygen 0 L     6 Minute Oxygen Saturation % 85 %     6 Minute Liters of Oxygen 0 L     2 Minute Post Oxygen Saturation % 96 %     2 Minute Post Liters of Oxygen 0 L              Oxygen Initial Assessment:  Oxygen Initial Assessment - 08/15/21 1207       Intervention   Short Term Goals To learn and demonstrate proper pursed lip breathing techniques or other breathing techniques. ;To learn and understand importance of maintaining oxygen saturations>88%;To learn and demonstrate proper use of respiratory medications;To learn and understand importance of monitoring SPO2 with pulse oximeter and demonstrate accurate use of the pulse oximeter.;To learn and exhibit compliance with exercise, home and travel O2 prescription    Long  Term Goals Exhibits proper breathing techniques, such as pursed lip breathing or other method taught during program session;Maintenance of O2 saturations>88%;Exhibits compliance with exercise, home  and travel O2 prescription;Verbalizes importance of monitoring SPO2 with pulse oximeter and return demonstration             Oxygen Re-Evaluation:   Oxygen Discharge (Final Oxygen Re-Evaluation):   Initial Exercise  Prescription:  Initial Exercise Prescription - 08/15/21 1200       Date of Initial Exercise RX and Referring Provider   Date 08/15/21    Referring Provider [name], MD      NuStep   Level 2    SPM 75    Minutes 15    METs 2       Track   Laps 10    Minutes 15    METs 2.16      Prescription Details   Frequency (times per week) 2    Duration Progress to 30 minutes of continuous aerobic without signs/symptoms of physical distress      Intensity   THRR 40-80% of Max Heartrate 61-122    Ratings of Perceived Exertion 11-13    Perceived Dyspnea 0-4      Progression   Progression Continue progressive overload as per policy without signs/symptoms or physical distress.      Resistance Training   Training Prescription Yes    Weight Yellow bands    Reps 10-15             Perform Capillary Blood Glucose checks as needed.  Exercise Prescription Changes:   Exercise Comments:   Exercise Comments     Row Name 08/15/21 1205           Exercise Comments Pt with current history of Thoracic aortic aneurysm - BP [place] <180/90                Exercise Goals and Review:   Exercise Goals     Row Name 08/15/21 1011             Exercise Goals   Increase Physical Activity Yes       Intervention Provide advice, education, support and counseling about physical activity/exercise needs.;Develop an individualized exercise prescription for aerobic and resistive training based on initial evaluation findings, risk stratification, comorbidities and participant's personal goals.       Expected Outcomes Long Term: Exercising regularly at least 3-5 days a week.;Long Term: Add in home exercise to make exercise part of routine and to increase amount of physical activity.;Short Term: Attend rehab on a regular basis to increase amount of physical activity.       Increase Strength and Stamina Yes       Intervention Provide advice, education, support and counseling about physical activity/exercise needs.;Develop an individualized exercise prescription for aerobic and resistive training based on initial evaluation findings, risk stratification, comorbidities and participant's personal goals.       Expected Outcomes Long  Term: Improve cardiorespiratory fitness, muscular endurance and strength as measured by increased METs and functional capacity (6MWT);Short Term: Perform resistance training exercises routinely during rehab and add in resistance training at home;Short Term: Increase workloads from initial exercise prescription for resistance, speed, and METs.       Able to understand and use rate of perceived exertion (RPE) scale Yes       Intervention Provide education and explanation on how to use RPE scale       Able to understand and use Dyspnea scale Yes       Intervention Provide education and explanation on how to use Dyspnea scale       Expected Outcomes Short Term: Able to use Dyspnea scale daily in rehab to express subjective sense of shortness of breath during exertion;Long Term: Able to use Dyspnea scale to guide intensity level when exercising independently       Knowledge and  understanding of Target Heart Rate Range (THRR) Yes       Intervention Provide education and explanation of THRR including how the numbers were predicted and where they are located for reference       Expected Outcomes Short Term: Able to state/look up THRR;Long Term: Able to use THRR to govern intensity when exercising independently;Short Term: Able to use daily as guideline for intensity in rehab       Understanding of Exercise Prescription Yes       Intervention Provide education, explanation, and written materials on patient's individual exercise prescription       Expected Outcomes Short Term: Able to explain program exercise prescription;Long Term: Able to explain home exercise prescription to exercise independently                Exercise Goals Re-Evaluation :   Discharge Exercise Prescription (Final Exercise Prescription Changes):   Nutrition:  Target Goals: Understanding of nutrition guidelines, daily intake of sodium '1500mg'$ , cholesterol '200mg'$ , calories 30% from fat and 7% or less from saturated fats, daily to  have 5 or more servings of fruits and vegetables.  Biometrics:  Pre Biometrics - 08/15/21 1100       Pre Biometrics   Grip Strength 26 kg              Nutrition Therapy Plan and Nutrition Goals:   Nutrition Assessments:  MEDIFICTS Score Key: ?70 Need to make dietary changes  40-70 Heart Healthy Diet ? 40 Therapeutic Level Cholesterol Diet    Picture Your Plate Scores: [id number] Unhealthy dietary pattern with much room for improvement. 41-50 Dietary pattern unlikely to meet recommendations for good health and room for improvement. 51-60 More healthful dietary pattern, with some room for improvement.  >60 Healthy dietary pattern, although there may be some specific behaviors that could be improved.    Nutrition Goals Re-Evaluation:   Nutrition Goals Re-Evaluation:   Nutrition Goals Discharge (Final Nutrition Goals Re-Evaluation):   Psychosocial: Target Goals: Acknowledge presence or absence of significant depression and/or stress, maximize coping skills, provide positive support system. Participant is able to verbalize types and ability to use techniques and skills needed for reducing stress and depression.  Initial Review & Psychosocial Screening:  Initial Psych Review & Screening - 08/15/21 1209       Screening Interventions   Expected Outcomes Short Term goal: Identification and review with participant of any Quality of Life or Depression concerns found by scoring the questionnaire.;Long Term goal: The participant improves quality of Life and PHQ9 Scores as seen by post scores and/or verbalization of changes             Quality of Life Scores:  Scores of 19 and below usually indicate a poorer quality of life in these areas.  A difference of  2-3 points is a clinically meaningful difference.  A difference of 2-3 points in the total score of the Quality of Life Index has been associated with significant improvement in overall quality of life, self-image, physical  symptoms, and general health in studies assessing change in quality of life.  PHQ-9: Recent Review Flowsheet Data     Depression screen [facility] 2/9 08/15/2021   Decreased Interest 0   Down, Depressed, Hopeless 0   PHQ - 2 Score 0   Altered sleeping 0   Tired, decreased energy 0   Change in appetite 0   Feeling bad or failure about yourself  0   Trouble concentrating 0   Moving slowly or  fidgety/restless 0   Suicidal thoughts 0   PHQ-9 Score 0   Difficult doing work/chores Not difficult at all      Interpretation of Total Score  Total Score Depression Severity:  1-4 = Minimal depression, 5-9 = Mild depression, 10-14 = Moderate depression, 15-19 = Moderately severe depression, 20-27 = Severe depression   Psychosocial Evaluation and Intervention:   Psychosocial Re-Evaluation:   Psychosocial Discharge (Final Psychosocial Re-Evaluation):   Vocational Rehabilitation: Provide vocational rehab assistance to qualifying candidates.   Vocational Rehab Evaluation & Intervention:  Vocational Rehab - 08/15/21 1017       Initial Vocational Rehab Evaluation & Intervention   Assessment shows need for Vocational Rehabilitation No             Education: Education Goals: Education classes will be provided on a weekly basis, covering required topics. Participant will state understanding/return demonstration of topics presented.  Learning Barriers/Preferences:  Learning Barriers/Preferences - 08/15/21 1020       Learning Barriers/Preferences   Learning Barriers Sight    Learning Preferences Written Material;Computer/Internet;Individual Instruction             Education Topics: Count Your Pulse:  -Group instruction provided by verbal instruction, demonstration, patient participation and written materials to support subject.  Instructors address importance of being able to find your pulse and how to count your pulse when at home without a heart monitor.  Patients get hands on  experience counting their pulse with staff help and individually.   Heart Attack, Angina, and Risk Factor Modification:  -Group instruction provided by verbal instruction, video, and written materials to support subject.  Instructors address signs and symptoms of angina and heart attacks.    Also discuss risk factors for heart disease and how to make changes to improve heart health risk factors.   Functional Fitness:  -Group instruction provided by verbal instruction, demonstration, patient participation, and written materials to support subject.  Instructors address safety measures for doing things around the house.  Discuss how to get up and down off the floor, how to pick things up properly, how to safely get out of a chair without assistance, and balance training.   Meditation and Mindfulness:  -Group instruction provided by verbal instruction, patient participation, and written materials to support subject.  Instructor addresses importance of mindfulness and meditation practice to help reduce stress and improve awareness.  Instructor also leads participants through a meditation exercise.    Stretching for Flexibility and Mobility:  -Group instruction provided by verbal instruction, patient participation, and written materials to support subject.  Instructors lead participants through series of stretches that are designed to increase flexibility thus improving mobility.  These stretches are additional exercise for major muscle groups that are typically performed during regular warm up and cool down.   Hands Only CPR:  -Group verbal, video, and participation provides a basic overview of AHA guidelines for community CPR. Role-play of emergencies allow participants the opportunity to practice calling for help and chest compression technique with discussion of AED use.   Hypertension: -Group verbal and written instruction that provides a basic overview of hypertension including the most  recent diagnostic guidelines, risk factor reduction with self-care instructions and medication management.    Nutrition I class: Heart Healthy Eating:  -Group instruction provided by PowerPoint slides, verbal discussion, and written materials to support subject matter. The instructor gives an explanation and review of the Therapeutic Lifestyle Changes diet recommendations, which includes a discussion on lipid goals, dietary fat, sodium,  fiber, plant stanol/sterol esters, sugar, and the components of a well-balanced, healthy diet.   Nutrition II class: Lifestyle Skills:  -Group instruction provided by PowerPoint slides, verbal discussion, and written materials to support subject matter. The instructor gives an explanation and review of label reading, grocery shopping for heart health, heart healthy recipe modifications, and ways to make healthier choices when eating out.   Diabetes Question & Answer:  -Group instruction provided by PowerPoint slides, verbal discussion, and written materials to support subject matter. The instructor gives an explanation and review of diabetes co-morbidities, pre- and post-prandial blood glucose goals, pre-exercise blood glucose goals, signs, symptoms, and treatment of hypoglycemia and hyperglycemia, and foot care basics.   Diabetes Blitz:  -Group instruction provided by PowerPoint slides, verbal discussion, and written materials to support subject matter. The instructor gives an explanation and review of the physiology behind type 1 and type 2 diabetes, diabetes medications and rational behind using different medications, pre- and post-prandial blood glucose recommendations and Hemoglobin A1c goals, diabetes diet, and exercise including blood glucose guidelines for exercising safely.    Portion Distortion:  -Group instruction provided by PowerPoint slides, verbal discussion, written materials, and food models to support subject matter. The instructor gives an  explanation of serving size versus portion size, changes in portions sizes over the last 20 years, and what consists of a serving from each food group.   Stress Management:  -Group instruction provided by verbal instruction, video, and written materials to support subject matter.  Instructors review role of stress in heart disease and how to cope with stress positively.     Exercising on Your Own:  -Group instruction provided by verbal instruction, power point, and written materials to support subject.  Instructors discuss benefits of exercise, components of exercise, frequency and intensity of exercise, and end points for exercise.  Also discuss use of nitroglycerin and activating EMS.  Review options of places to exercise outside of rehab.  Review guidelines for sex with heart disease.   Cardiac Drugs I:  -Group instruction provided by verbal instruction and written materials to support subject.  Instructor reviews cardiac drug classes: antiplatelets, anticoagulants, beta blockers, and statins.  Instructor discusses reasons, side effects, and lifestyle considerations for each drug class.   Cardiac Drugs II:  -Group instruction provided by verbal instruction and written materials to support subject.  Instructor reviews cardiac drug classes: angiotensin converting enzyme inhibitors (ACE-I), angiotensin II receptor blockers (ARBs), nitrates, and calcium channel blockers.  Instructor discusses reasons, side effects, and lifestyle considerations for each drug class.   Anatomy and Physiology of the Circulatory System:  Group verbal and written instruction and models provide basic cardiac anatomy and physiology, with the coronary electrical and arterial systems. Review of: AMI, Angina, Valve disease, Heart Failure, Peripheral Artery Disease, Cardiac Arrhythmia, Pacemakers, and the ICD.   Other Education:  -Group or individual verbal, written, or video instructions that support the educational  goals of the cardiac rehab program.   Holiday Eating Survival Tips:  -Group instruction provided by PowerPoint slides, verbal discussion, and written materials to support subject matter. The instructor gives patients tips, tricks, and techniques to help them not only survive but enjoy the holidays despite the onslaught of food that accompanies the holidays.   Knowledge Questionnaire Score:  Knowledge Questionnaire Score - 08/15/21 1027       Knowledge Questionnaire Score   Pre Score 17/18 (P)              Core Components/Risk Factors/Patient  Goals at Admission:  Personal Goals and Risk Factors at Admission - 08/15/21 1209       Core Components/Risk Factors/Patient Goals on Admission   Intervention Provide education and demonstration as needed of appropriate use of medications, inhalers, and oxygen therapy.    Expected Outcomes Long Term: Maintain appropriate use of medications, inhalers, and oxygen therapy.;Short Term: Achieves understanding of medications use. Understands that oxygen is a medication prescribed by physician. Demonstrates appropriate use of inhaler and oxygen therapy.    Expected Outcomes Long Term: Maintenance of blood pressure at goal levels.;Short Term: Continued assessment and intervention until BP is < 140/33m HG in hypertensive participants. < 130/875mHG in hypertensive participants with diabetes, heart failure or chronic kidney disease.             Core Components/Risk Factors/Patient Goals Review:    Core Components/Risk Factors/Patient Goals at Discharge (Final Review):    ITP Comments:  RoElvisill begin exercise on 9/20 at the 10:15 am class time. CaCherre HugerBSN Cardiac and PuTraining and development officer

## 2021-08-15 NOTE — Progress Notes (Signed)
[name] [name] 65 y.o. female Pulmonary Rehab Orientation Note [name] [name] who was referred to Pulmonary rehab by Dr. Simeon Craft with the diagnosis of other emphysema arrived today in Cardiac and Pulmonary Rehab. She arrived ambulatory with balance and steady gait. She does not carry portable oxygen.  Per pt, she uses oxygen never. Color good, skin warm and dry. Patient is oriented to time and place. Patient's medical history, psychosocial health, and medications reviewed. Psychosocial assessment reveals pt lives with their spouse who has accompanied her to this appt. Pt is currently retired from [employer] . Pt hobbies include reading and doing 1,000 piece jigsaw puzzles. Pt reports her stress level is low  with no current stressors however she is competitive by nature which she recognizes and is doing better with this.  She states that she is "concerned" about the thoracic aneurysm which she is doing pulmonary rehab prior to having the surgery.  She understands the risk and does not consider it stressful just concerned  Pt does not exhibit signs of depression. PHQ2/9 score 0/0. Pt shows good  coping skills with positive outlook with supportive husband who accompanies her today  engaged positively during the orientation. Will continue to monitor and evaluate progress toward psychosocial goal(s) of increasing her physical status so that she will have a good outcome with her surgery. Physical assessment reveals heart rate is normal, breath sounds clear to auscultation, no wheezes, rales, or rhonchi however diminished. Throughout.  Grip strength equal, strong. Distal pulses palpable with trace swelling to the right ankle. Patient reports she does take medications as prescribed. Patient states she follows a Regular diet.  However this is goal for her. Patient's weight will be monitored closely. Demonstration and practice of PLB using pulse oximeter. Patient able to return demonstration satisfactorily. Safety and hand  hygiene in the exercise area reviewed with patient. Patient voices understanding of the information reviewed. Department expectations discussed with patient and achievable goals were set. The patient shows enthusiasm about attending the program and we look forward to working with this nice lady.  45 minutes was spent on a variety of activities such as assessment of the patient, obtaining baseline data including height, weight, BMI, and grip strength, verifying medical history, allergies, and current medications, and teaching patient strategies for performing tasks with less respiratory effort with emphasis on pursed lip breathing.  Cherre Huger, BSN Cardiac and Training and development officer

## 2021-08-19 ENCOUNTER — Other Ambulatory Visit: Payer: Self-pay

## 2021-08-19 ENCOUNTER — Encounter (HOSPITAL_COMMUNITY)
Admission: RE | Admit: 2021-08-19 | Discharge: 2021-08-19 | Disposition: A | Discharge status: Home / Self Care | Payer: PPO | Source: Ambulatory Visit | Attending: Surgery | Admitting: Surgery

## 2021-08-19 VITALS — Wt 179.2 lb

## 2021-08-19 DIAGNOSIS — J438 Other emphysema: Secondary | ICD-10-CM

## 2021-08-19 NOTE — Progress Notes (Signed)
Daily Session Note  Patient Details  Name: [name] [name] MRN: [id number] Date of Birth: November 28, 1956 Referring Provider:   [name] Pulmonary Rehab Walk Test from 08/15/2021 in [place]  Referring Provider [name], MD       Encounter Date: 08/19/2021  Check In:  Session Check In - 08/19/21 1146       Check-In   Supervising physician immediately available to respond to emergencies [facility] immediately available    Physician(s) Dr. Sarajane Jews    Location [facility]    Staff Present Rosebud Poles, RN, Quentin Ore, MS, ACSM-CEP, Exercise Physiologist;Jaslyne Beeck Ysidro Evert, RN    Virtual Visit No    Medication changes reported     No    Fall or balance concerns reported    No    Tobacco Cessation No Change    Warm-up and Cool-down Performed as group-led instruction    Resistance Training Performed Yes    VAD Patient? No    PAD/SET Patient? No      Pain Assessment   Currently in Pain? No/denies    Pain Score 0-No pain    Multiple Pain Sites No             Capillary Blood Glucose: No results found for this or any previous visit (from the past 24 hour(s)).   Exercise Prescription Changes - 08/19/21 1200       Response to Exercise   Blood Pressure (Admit) 146/82    Blood Pressure (Exercise) 164/90    Blood Pressure (Exit) 134/80    Heart Rate (Admit) 75 bpm    Heart Rate (Exercise) 85 bpm    Heart Rate (Exit) 83 bpm    Oxygen Saturation (Admit) 96 %    Oxygen Saturation (Exercise) 93 %    Oxygen Saturation (Exit) 96 %    Rating of Perceived Exertion (Exercise) 11    Perceived Dyspnea (Exercise) 2    Duration Continue with 30 min of aerobic exercise without signs/symptoms of physical distress.    Intensity Other (comment)   40-80% of HRR     Progression   Progression Continue to progress workloads to maintain intensity without signs/symptoms of physical distress.      Resistance Training   Training  Prescription Yes    Weight Yellow bands    Reps 10-15    Time 10 Minutes      NuStep   Level 2    SPM 80    Minutes 30    METs 1.7             Social History   Tobacco Use  Smoking Status Former   Packs/day: 0.00   Years: 30.00   Pack years: 0.00   Types: Cigarettes   Start date: 12/01/2003   Quit date: 01/06/2013   Years since quitting: 8.6  Smokeless Tobacco Never    Goals Met:  Exercise tolerated well No report of concerns or symptoms today Strength training completed today  Goals Unmet:  Not Applicable   Comments: Service time is from 1015 to 31    Dr. Fransico Him is Medical Director for Cardiac Rehab at [facility].

## 2021-08-19 NOTE — Progress Notes (Signed)
[name] [name] 65 y.o. female Nutrition Note  Diagnosis: other emphysema   Past Medical History:  Diagnosis Date   Arthritis    right knee, left hip   Carpal tunnel syndrome of left wrist 05/2017   COPD, severe (HCC)    SOB with ADLs per pt. - no O2   Cubital tunnel syndrome on left 05/2017   Dental crowns present    also dental implants   Esophageal reflux    Family history of adverse reaction to anesthesia    pt's mother has hx. of agitation and hallucinations post-op   History of asthma    as a child   Hypothyroidism (acquired)    Tachycardia    Holter monitor 06/08/2017 - 06/10/2017:  does not have results yet     Medications reviewed.   Current Outpatient Medications:    albuterol (VENTOLIN HFA) 108 (90 Base) MCG/ACT inhaler, Inhale 2 puffs into the lungs every 4 (four) hours as needed for wheezing or shortness of breath., Disp: , Rfl:    cholecalciferol (VITAMIN D) 1000 units tablet, Take 1,000 Units by mouth daily., Disp: , Rfl:    losartan (COZAAR) 25 MG tablet, Take 25 mg by mouth daily., Disp: , Rfl:    Multiple Vitamin (MULTIVITAMIN) tablet, Take 1 tablet by mouth daily., Disp: , Rfl:    omeprazole (PRILOSEC) 20 MG capsule, Take 20 mg by mouth 2 (two) times daily., Disp: , Rfl:    simvastatin (ZOCOR) 40 MG tablet, Take 40 mg by mouth at bedtime., Disp: , Rfl:    SPIRIVA RESPIMAT 2.5 MCG/ACT AERS, Inhale 2.5 mg into the lungs in the morning., Disp: , Rfl:    SYNTHROID 100 MCG tablet, Take 100 mcg by mouth daily., Disp: , Rfl:    Ht Readings from Last 1 Encounters:  08/15/21 5' 7.5" (1.715 m)     Wt Readings from Last 3 Encounters:  08/15/21 196 lb 13.9 oz (89.3 kg)  05/20/21 194 lb 0.1 oz (88 kg)  05/01/21 194 lb (88 kg)     There is no height or weight on file to calculate BMI.   Social History   Tobacco Use  Smoking Status Former   Packs/day: 0.00   Years: 30.00   Pack years: 0.00   Types: Cigarettes   Start date: 12/01/2003   Quit date: 01/06/2013    Years since quitting: 8.6  Smokeless Tobacco Never      Nutrition Note  Spoke with pt. Nutrition Plan and Nutrition Survey goals reviewed with pt.  Pt is following a general healthful diet. She identifies area to improve: increasing whole grains. We discussed whole grains and how to identify.  Pt with hyperlipidemia. She already limits red meats and foods high in saturated fats. Reviewed fiber richer foods and focusing on a balanced diet.   Pt expressed understanding of the information reviewed.    Nutrition Diagnosis Food-and nutrition-related knowledge deficit related to lack of exposure to information as related to diagnosis of: ?COPD, HLD  Nutrition Intervention Pt's individual nutrition plan reviewed with pt. Benefits of adopting healthy diet reviewed with Picture My Plate survey   Continue client-centered nutrition education by RD, as part of interdisciplinary care.  Goal(s)  Pt to build a healthy plate including vegetables, fruits, whole grains, and low-fat dairy products in a heart healthy meal plan.   Plan:  Will provide client-centered nutrition education as part of interdisciplinary care Monitor and evaluate progress toward nutrition goal with team.   Michaele Offer, MS,  RDN, LDN, CDCES

## 2021-08-21 ENCOUNTER — Other Ambulatory Visit: Payer: Self-pay | Admitting: *Deleted

## 2021-08-21 ENCOUNTER — Ambulatory Visit: Payer: PPO | Admitting: Physician Assistant

## 2021-08-21 ENCOUNTER — Encounter (HOSPITAL_COMMUNITY)
Admission: RE | Admit: 2021-08-21 | Discharge: 2021-08-21 | Disposition: A | Discharge status: Home / Self Care | Payer: PPO | Source: Ambulatory Visit | Attending: Surgery | Admitting: Surgery

## 2021-08-21 ENCOUNTER — Ambulatory Visit
Admission: RE | Admit: 2021-08-21 | Discharge: 2021-08-21 | Disposition: A | Discharge status: Home / Self Care | Payer: PPO | Source: Ambulatory Visit | Attending: Thoracic Surgery (Cardiothoracic Vascular Surgery) | Admitting: Thoracic Surgery (Cardiothoracic Vascular Surgery)

## 2021-08-21 ENCOUNTER — Other Ambulatory Visit: Payer: Self-pay

## 2021-08-21 VITALS — BP 140/85 | HR 100 | Resp 20 | Ht 67.5 in | Wt 199.0 lb

## 2021-08-21 DIAGNOSIS — I712 Thoracic aortic aneurysm, without rupture, unspecified: Secondary | ICD-10-CM

## 2021-08-21 DIAGNOSIS — J438 Other emphysema: Secondary | ICD-10-CM

## 2021-08-21 DIAGNOSIS — I700 Atherosclerosis of aorta: Secondary | ICD-10-CM | POA: Diagnosis not present

## 2021-08-21 DIAGNOSIS — J439 Emphysema, unspecified: Secondary | ICD-10-CM | POA: Diagnosis not present

## 2021-08-21 DIAGNOSIS — I7121 Aneurysm of the ascending aorta, without rupture: Secondary | ICD-10-CM

## 2021-08-21 DIAGNOSIS — I2510 Atherosclerotic heart disease of native coronary artery without angina pectoris: Secondary | ICD-10-CM

## 2021-08-21 NOTE — Progress Notes (Signed)
[address]       [place] [address]             [phone]     Referring Provider is Lajean Manes, MD Primary Cardiologist is None PCP is Lajean Manes, MD  Chief Complaint  Patient presents with   Thoracic Aortic Aneurysm    Surgical consult, CT Chest 03/19/21    HPI:  65 several lady is referred for consideration of surgical repair of ascending aortic aneurysm which was incidentally discovered during a noncontrast low-dose chest CT to evaluate for pulmonary nodules.  This was first done over a year ago.  Her lung nodules have since resolved.  The initial CT suggested an ascending aortic aneurysm measuring approximately 46 to 47 mm in diameter.  I scan a couple of months ago suggest closer to the 4.9 cm range.  She has no personal history of aneurysms.  She does have a history of smoking and some suggestion of connective tissue disorders including arthritis.  Her father had several aneurysms including an ascending aneurysm.  She denies any chest pains or back pain.  Past Medical History:  Diagnosis Date   Arthritis    right knee, left hip   Carpal tunnel syndrome of left wrist 05/2017   COPD, severe (HCC)    SOB with ADLs per pt. - no O2   Cubital tunnel syndrome on left 05/2017   Dental crowns present    also dental implants   Esophageal reflux    Family history of adverse reaction to anesthesia    pt's mother has hx. of agitation and hallucinations post-op   History of asthma    as a child   Hypothyroidism (acquired)    Tachycardia    Holter monitor 06/08/2017 - 06/10/2017:  does not have results yet    Past Surgical History:  Procedure Laterality Date   BREAST LUMPECTOMY Left    CARPAL TUNNEL RELEASE Left 06/17/2017   Procedure: CARPAL TUNNEL RELEASE;  Surgeon: Daryll Brod, MD;  Location: [place];  Service: Orthopedics;  Laterality: Left;   CATARACT EXTRACTION W/ INTRAOCULAR LENS IMPLANT Left    MASS EXCISION Left  03/26/2005   thumb   MULTIPLE TOOTH EXTRACTIONS     THYROIDECTOMY, PARTIAL     TONSILLECTOMY     ULNAR NERVE TRANSPOSITION Left 06/17/2017   Procedure: DECOMPRESSION  ULNAR NERVE LEFT ELBOW;  Surgeon: Daryll Brod, MD;  Location: [place];  Service: Orthopedics;  Laterality: Left;    Family History  Problem Relation Age of Onset   Colon cancer Mother    Breast cancer Maternal Aunt    Allergies Brother     Social History   Socioeconomic History   Marital status: Married    Spouse name: Not on file   Number of children: Not on file   Years of education: Not on file   Highest education level: Not on file  Occupational History   Occupation: Government social research officer for [employer]    Employer: [employer]  Tobacco Use   Smoking status: Former Smoker    Packs/day: 0.00    Years: 30.00    Pack years: 0.00    Start date: 12/01/2003    Quit date: 01/06/2013    Years since quitting: 8.3   Smokeless tobacco: Never Used  Vaping Use   Vaping Use: Never used  Substance and Sexual Activity   Alcohol use: Yes    Comment: 2 beers  per week   Drug use: No   Sexual activity: Not on file  Other Topics Concern   Not on file  Social History Narrative   Not on file   Social Determinants of Health   Financial Resource Strain: Not on file  Food Insecurity: Not on file  Transportation Needs: Not on file  Physical Activity: Not on file  Stress: Not on file  Social Connections: Not on file  Intimate Partner Violence: Not on file    Current Outpatient Medications on File Prior to Visit  Medication Sig Dispense Refill   albuterol (VENTOLIN HFA) 108 (90 Base) MCG/ACT inhaler Inhale 2 puffs into the lungs every 4 (four) hours as needed for wheezing or shortness of breath.     cholecalciferol (VITAMIN D) 1000 units tablet Take 1,000 Units by mouth daily.     losartan (COZAAR) 25 MG tablet Take 25 mg by mouth daily.     Multiple Vitamin (MULTIVITAMIN) tablet Take 1 tablet by mouth  daily.     omeprazole (PRILOSEC) 20 MG capsule Take 20 mg by mouth 2 (two) times daily.     simvastatin (ZOCOR) 40 MG tablet Take 40 mg by mouth at bedtime.     SPIRIVA RESPIMAT 2.5 MCG/ACT AERS Inhale 2.5 mg into the lungs in the morning.     SYNTHROID 100 MCG tablet Take 100 mcg by mouth daily.     No current facility-administered medications on file prior to visit.      Allergies  Allergen Reactions   Peanut-Containing Drug Products Shortness Of Breath    [place] NUTS   Other Other (See Comments)   Codeine Other (See Comments)    UNKNOWN      Review of Systems:   General:  Reduced energy, endorses weight gain  Cardiac:  No chest pain palpitations or shortness of breath with exertion  Respiratory:  History of emphysema and smoking with 30 pack years  GI:   History of reflux and constipation last colonoscopy 2019  GU:   Occasional dysuria  Vascular:  Occasional right lower extremity swelling; denies claudication  Neuro:   No history of strokes or TIAs   Musculoskeletal: Bilateral hip arthritis  Skin:   Negative  Psych:   Negative  Eyes:   wears glasses   ENT:   Last saw dentist 3 months ago  Hematologic:  Negative  Endocrine:  No diabetes or thyroid disorders     Physical Exam:  Vitals:   08/21/21 1259  BP: 140/85  Pulse: 100  Resp: 20  SpO2: 95%     General: well-appearing  Neck:no JVD, no bruits Chest: clear to auscultation, symmetrical breath sounds, end expiratory wheezes, no rhonchi   CV:RRR, no  murmur   Abdomen:soft, non-tender, no masses   Extremities:warm, well-perfused, pulses intact, no LE edema  Neuro: Grossly non-focal and symmetrical throughout    Diagnostic Tests:  CLINICAL DATA:  Thoracic aortic aneurysm, follow-up examination   EXAM: CT CHEST WITHOUT CONTRAST   TECHNIQUE: Multidetector CT imaging of the chest was performed following the standard protocol without IV contrast.   COMPARISON:  03/19/2021   FINDINGS: Cardiovascular:  Mild multi-vessel coronary artery calcification. Global cardiac size within normal limits. No pericardial effusion. Central pulmonary arteries are of normal caliber. Thoracic aorta demonstrates fusiform aneurysmal dilation of the ascending segment which is stable since prior examination, measuring 4.7 cm. Mild pulsation artifact noted. Descending thoracic aorta measures 2.9 cm in diameter just beyond the takeoff of the left subclavian artery and  2.6 cm in diameter at the level of the left atrium, stable. Mild atherosclerotic calcification within the thoracic aorta.   Mediastinum/Nodes: No enlarged mediastinal or axillary lymph nodes. Thyroid gland, trachea, and esophagus demonstrate no significant findings.   Lungs/Pleura: Severe centrilobular emphysema. No focal pulmonary nodules. Stable biapical scarring. No pneumothorax or pleural effusion. Central airways are widely patent.   Upper Abdomen: No acute abnormality.   Musculoskeletal: No chest wall mass or suspicious bone lesions identified. Osseous structures are age-appropriate.   IMPRESSION: Stable thoracic aortic aneurysm measuring 4.7 cm in greatest dimension. Ascending thoracic aortic aneurysm. Recommend semi-annual imaging followup by CTA or MRA and referral to cardiothoracic surgery if not already obtained. This recommendation follows 2010 ACCF/AHA/AATS/ACR/ASA/SCA/SCAI/SIR/STS/SVM Guidelines for the Diagnosis and Management of Patients With Thoracic Aortic Disease. Circulation. 2010; 121: F818-E993. Aortic aneurysm NOS (ICD10-I71.9)   Mild coronary artery calcification.   Severe emphysema   Aortic Atherosclerosis (ICD10-I70.0) and Emphysema (ICD10-J43.9). Aortic aneurysm NOS (ICD10-I71.9).     Electronically Signed   By: Fidela Salisbury M.D.   On: 08/21/2021 13:19   CLINICAL DATA:  Nodule follow-up   EXAM: CT CHEST WITHOUT CONTRAST   TECHNIQUE: Multidetector CT imaging of the chest was performed following  the standard protocol without IV contrast.   COMPARISON:  03/06/2020   FINDINGS: Cardiovascular: Again identified is a thoracic aortic aneurysm currently measuring approximately 4.7 cm in diameter (previously measuring approximately 4.6 cm). The heart size is unremarkable. Coronary artery calcifications are noted. There are atherosclerotic changes of the thoracic aorta.   Mediastinum/Nodes:   -- No mediastinal lymphadenopathy.   -- No hilar lymphadenopathy.   -- No axillary lymphadenopathy.   -- No supraclavicular lymphadenopathy.   --the patient is status post right-sided hemithyroidectomy.   -  Unremarkable esophagus.   Lungs/Pleura: There are severe emphysematous changes bilaterally. There is no pneumothorax or concerning pulmonary nodule. There is no significant pleural effusion. The trachea is unremarkable.   Upper Abdomen: There is no acute abnormality in the upper abdomen.   Musculoskeletal: No chest wall abnormality. No bony spinal canal stenosis.   IMPRESSION: 1. Persistent thoracic aortic aneurysm currently measuring approximately 4.7 cm in diameter (previously measuring approximately 4.6 cm). Ascending thoracic aortic aneurysm. Recommend semi-annual imaging followup by CTA or MRA and referral to cardiothoracic surgery if not already obtained. This recommendation follows 2010 ACCF/AHA/AATS/ACR/ASA/SCA/SCAI/SIR/STS/SVM Guidelines for the Diagnosis and Management of Patients With Thoracic Aortic Disease. Circulation. 2010; 121: Z169-C789. Aortic aneurysm NOS (ICD10-I71.9) 2. Coronary artery disease. 3. Severe emphysematous changes without evidence for concerning pulmonary nodule.   Aortic Atherosclerosis (ICD10-I70.0) and Emphysema (ICD10-J43.9).     Electronically Signed   By: Constance Holster M.D.   On: 03/19/2021 22:07   Impression:  65 year old lady with family history of aneurysms and a 4.7 cm ascending aortic aneurysm herself.  She is  interested in prospective evaluation to get this repaired electively however she is not near the surgical recommended 5.5 cm aneurysm.   Plan:  Doing well with pulmonary rehab  Continue to observe strict blood pressure control, no heavy lifting, no quinolones.   Follow-up in 6 months with repeat noncontrast chest CT "follow-up aneurysm"  She never did get her echocardiogram. Will send a referral to cardiology.   She will also need follow-up with a surgeon.   I spent in excess of 30 minutes during the conduct of this office consultation and >50% of this time involved direct face-to-face encounter with the patient for counseling and/or coordination of their care.  Level 3 Office Consult = 40 minutes         Level 4 Office Consult = 60 minutes         Level 5 Office Consult = 80 minutes  Nicholes Rough, [place] [phone]

## 2021-08-21 NOTE — Progress Notes (Signed)
Daily Session Note  Patient Details  Name: [name] [name] MRN: [id number] Date of Birth: 1956-02-02 Referring Provider:   [name] Pulmonary Rehab Walk Test from 08/15/2021 in [place]  Referring Provider [name], MD       Encounter Date: 08/21/2021  Check In:  Session Check In - 08/21/21 1102       Check-In   Supervising physician immediately available to respond to emergencies [facility] immediately available    Physician(s) Dr. Florene Glen    Location [facility]    Staff Present Rosebud Poles, RN, Quentin Ore, MS, ACSM-CEP, Exercise Physiologist;Lisa Ysidro Evert, RN    Virtual Visit No    Medication changes reported     No    Fall or balance concerns reported    No    Tobacco Cessation No Change    Warm-up and Cool-down Performed as group-led instruction    Resistance Training Performed Yes    VAD Patient? No    PAD/SET Patient? No      Pain Assessment   Currently in Pain? No/denies    Multiple Pain Sites No             Capillary Blood Glucose: No results found for this or any previous visit (from the past 24 hour(s)).    Social History   Tobacco Use  Smoking Status Former   Packs/day: 0.00   Years: 30.00   Pack years: 0.00   Types: Cigarettes   Start date: 12/01/2003   Quit date: 01/06/2013   Years since quitting: 8.6  Smokeless Tobacco Never    Goals Met:  Proper associated with RPD/PD & O2 Sat Exercise tolerated well No report of concerns or symptoms today Strength training completed today  Goals Unmet:  Not Applicable  Comments: Service time is from 1015 to 1125.    Dr. Fransico Him is Medical Director for Cardiac Rehab at [facility].

## 2021-08-22 DIAGNOSIS — M8580 Other specified disorders of bone density and structure, unspecified site: Secondary | ICD-10-CM | POA: Diagnosis not present

## 2021-08-22 DIAGNOSIS — I712 Thoracic aortic aneurysm, without rupture: Secondary | ICD-10-CM | POA: Diagnosis not present

## 2021-08-22 DIAGNOSIS — E785 Hyperlipidemia, unspecified: Secondary | ICD-10-CM | POA: Diagnosis not present

## 2021-08-22 DIAGNOSIS — E890 Postprocedural hypothyroidism: Secondary | ICD-10-CM | POA: Diagnosis not present

## 2021-08-22 DIAGNOSIS — I2510 Atherosclerotic heart disease of native coronary artery without angina pectoris: Secondary | ICD-10-CM | POA: Diagnosis not present

## 2021-08-22 DIAGNOSIS — R911 Solitary pulmonary nodule: Secondary | ICD-10-CM | POA: Diagnosis not present

## 2021-08-22 DIAGNOSIS — I700 Atherosclerosis of aorta: Secondary | ICD-10-CM | POA: Diagnosis not present

## 2021-08-22 DIAGNOSIS — J449 Chronic obstructive pulmonary disease, unspecified: Secondary | ICD-10-CM | POA: Diagnosis not present

## 2021-08-22 DIAGNOSIS — I2584 Coronary atherosclerosis due to calcified coronary lesion: Secondary | ICD-10-CM | POA: Diagnosis not present

## 2021-08-22 DIAGNOSIS — I10 Essential (primary) hypertension: Secondary | ICD-10-CM | POA: Diagnosis not present

## 2021-08-22 DIAGNOSIS — Z23 Encounter for immunization: Secondary | ICD-10-CM | POA: Diagnosis not present

## 2021-08-26 ENCOUNTER — Encounter (HOSPITAL_COMMUNITY)
Admission: RE | Admit: 2021-08-26 | Discharge: 2021-08-26 | Disposition: A | Discharge status: Home / Self Care | Payer: PPO | Source: Ambulatory Visit | Attending: Surgery | Admitting: Surgery

## 2021-08-26 ENCOUNTER — Other Ambulatory Visit: Payer: Self-pay

## 2021-08-26 DIAGNOSIS — J438 Other emphysema: Secondary | ICD-10-CM

## 2021-08-26 NOTE — Progress Notes (Signed)
Daily Session Note  Patient Details  Name: [name] [name] MRN: [id number] Date of Birth: 1956/08/10 Referring Provider:   [name] Pulmonary Rehab Walk Test from 08/15/2021 in [place]  Referring Provider [name], MD       Encounter Date: 08/26/2021  Check In:  Session Check In - 08/26/21 1124       Check-In   Supervising physician immediately available to respond to emergencies [facility] immediately available    Physician(s) Dr. Florene Glen    [facility]    Staff Present Rosebud Poles, RN, Quentin Ore, MS, ACSM-CEP, Exercise Physiologist;Lisa Ysidro Evert, RN    Virtual Visit No    Medication changes reported     No    Fall or balance concerns reported    No    Tobacco Cessation No Change    Warm-up and Cool-down Performed as group-led instruction    Resistance Training Performed Yes    VAD Patient? No    PAD/SET Patient? No      Pain Assessment   Currently in Pain? No/denies    Multiple Pain Sites No             Capillary Blood Glucose: No results found for this or any previous visit (from the past 24 hour(s)).    Social History   Tobacco Use  Smoking Status Former   Packs/day: 0.00   Years: 30.00   Pack years: 0.00   Types: Cigarettes   Start date: 12/01/2003   Quit date: 01/06/2013   Years since quitting: 8.6  Smokeless Tobacco Never    Goals Met:  Proper associated with RPD/PD & O2 Sat Improved SOB with ADL's Exercise tolerated well No report of concerns or symptoms today Strength training completed today  Goals Unmet:  Not Applicable  Comments: Service time is from 1026 to 49    Dr. Fransico Him is Medical Director for Cardiac Rehab at [facility].

## 2021-08-28 ENCOUNTER — Encounter (HOSPITAL_COMMUNITY)
Admission: RE | Admit: 2021-08-28 | Discharge: 2021-08-28 | Disposition: A | Discharge status: Home / Self Care | Payer: PPO | Source: Ambulatory Visit | Attending: Surgery | Admitting: Surgery

## 2021-08-28 ENCOUNTER — Other Ambulatory Visit: Payer: Self-pay

## 2021-08-28 DIAGNOSIS — J438 Other emphysema: Secondary | ICD-10-CM | POA: Diagnosis not present

## 2021-08-28 NOTE — Progress Notes (Signed)
Daily Session Note  Patient Details  Name: [name] [name] MRN: [id number] Date of Birth: 23-Nov-1956 Referring Provider:   [name] Pulmonary Rehab Walk Test from 08/15/2021 in [place]  Referring Provider [name], MD       Encounter Date: 08/28/2021  Check In:  Session Check In - 08/28/21 1122       Check-In   Supervising physician immediately available to respond to emergencies [facility] immediately available    Physician(s) Dr. Silvio Clayman    [facility]    Staff Present Rosebud Poles, RN, Quentin Ore, MS, ACSM-CEP, Exercise Physiologist;Lisa Ysidro Evert, RN    Virtual Visit No    Medication changes reported     No    Fall or balance concerns reported    No    Tobacco Cessation No Change    Warm-up and Cool-down Performed as group-led instruction    Resistance Training Performed Yes    VAD Patient? No    PAD/SET Patient? No      Pain Assessment   Currently in Pain? No/denies    Multiple Pain Sites No             Capillary Blood Glucose: No results found for this or any previous visit (from the past 24 hour(s)).    Social History   Tobacco Use  Smoking Status Former   Packs/day: 0.00   Years: 30.00   Pack years: 0.00   Types: Cigarettes   Start date: 12/01/2003   Quit date: 01/06/2013   Years since quitting: 8.6  Smokeless Tobacco Never    Goals Met:  Proper associated with RPD/PD & O2 Sat Independence with exercise equipment Exercise tolerated well No report of concerns or symptoms today Strength training completed today  Goals Unmet:  Not Applicable  Comments: Service time is from 1022 to 1145.    Dr. Fransico Him is Medical Director for Cardiac Rehab at [facility].

## 2021-09-02 ENCOUNTER — Other Ambulatory Visit: Payer: Self-pay

## 2021-09-02 ENCOUNTER — Encounter (HOSPITAL_COMMUNITY)
Admission: RE | Admit: 2021-09-02 | Discharge: 2021-09-02 | Disposition: A | Discharge status: Home / Self Care | Payer: PPO | Source: Ambulatory Visit | Attending: Surgery | Admitting: Surgery

## 2021-09-02 VITALS — Wt 197.5 lb

## 2021-09-02 DIAGNOSIS — J438 Other emphysema: Secondary | ICD-10-CM | POA: Diagnosis not present

## 2021-09-02 NOTE — Progress Notes (Signed)
Daily Session Note  Patient Details  Name: [name] [name] MRN: [id number] Date of Birth: January 09, 1956 Referring Provider:   [name] Pulmonary Rehab Walk Test from 08/15/2021 in [place]  Referring Provider [name], MD       Encounter Date: 09/02/2021  Check In:  Session Check In - 09/02/21 1119       Check-In   Supervising physician immediately available to respond to emergencies [facility] immediately available    Physician(s) Dr. Gerlean Ren    [facility]    Staff Present Rosebud Poles, RN, Milus Glazier, MS, ACSM-CEP, CCRP, Exercise Physiologist;Kaylee Rosana Hoes, MS, ACSM-CEP, Exercise Physiologist;Calani Gick Ysidro Evert, RN    Virtual Visit No    Medication changes reported     No    Fall or balance concerns reported    No    Tobacco Cessation No Change    Warm-up and Cool-down Performed as group-led instruction    Resistance Training Performed Yes    VAD Patient? No    PAD/SET Patient? No      Pain Assessment   Currently in Pain? No/denies    Multiple Pain Sites No             Capillary Blood Glucose: No results found for this or any previous visit (from the past 24 hour(s)).   Exercise Prescription Changes - 09/02/21 1200       Response to Exercise   Blood Pressure (Admit) 130/82    Blood Pressure (Exercise) 154/72    Blood Pressure (Exit) 136/80    Heart Rate (Admit) 72 bpm    Heart Rate (Exercise) 105 bpm    Heart Rate (Exit) 94 bpm    Oxygen Saturation (Admit) 96 %    Oxygen Saturation (Exercise) 96 %    Rating of Perceived Exertion (Exercise) 14    Perceived Dyspnea (Exercise) 3    Duration Progress to 30 minutes of  aerobic without signs/symptoms of physical distress    Intensity THRR unchanged      Progression   Progression Continue to progress workloads to maintain intensity without signs/symptoms of physical distress.      Resistance Training   Training Prescription No     Weight Yellow bands    Reps 10-15    Time 10 Minutes      NuStep   Level 3    SPM 80    Minutes 15    METs 2.2      Track   Laps 9    Minutes 15             Social History   Tobacco Use  Smoking Status Former   Packs/day: 0.00   Years: 30.00   Pack years: 0.00   Types: Cigarettes   Start date: 12/01/2003   Quit date: 01/06/2013   Years since quitting: 8.6  Smokeless Tobacco Never    Goals Met:  Exercise tolerated well No report of concerns or symptoms today Strength training completed today  Goals Unmet:  Not Applicable  Comments: Service time is from 1014 to 1140    Dr. Fransico Him is Medical Director for Cardiac Rehab at [facility].

## 2021-09-04 ENCOUNTER — Encounter (HOSPITAL_COMMUNITY)
Admission: RE | Admit: 2021-09-04 | Discharge: 2021-09-04 | Disposition: A | Discharge status: Home / Self Care | Payer: PPO | Source: Ambulatory Visit | Attending: Surgery | Admitting: Surgery

## 2021-09-04 ENCOUNTER — Other Ambulatory Visit: Payer: Self-pay

## 2021-09-04 DIAGNOSIS — J438 Other emphysema: Secondary | ICD-10-CM

## 2021-09-04 NOTE — Progress Notes (Signed)
Daily Session Note  Patient Details  Name: [name] [name] MRN: [id number] Date of Birth: October 09, 1956 Referring Provider:   [name] [facility] Walk Test from 08/15/2021 in [place]  Referring Provider [name], MD       Encounter Date: 09/04/2021  Check In:  Session Check In - 09/04/21 1118       Check-In   Supervising physician immediately available to respond to emergencies [facility] immediately available    Physician(s) Dr. Nevada Crane    Location [facility]    Staff Present Rosebud Poles, RN, Quentin Ore, MS, ACSM-CEP, Exercise Physiologist;Lisa Ysidro Evert, RN    Virtual Visit No    Medication changes reported     No    Fall or balance concerns reported    No    Tobacco Cessation No Change    Warm-up and Cool-down Performed as group-led instruction    Resistance Training Performed Yes    VAD Patient? No    PAD/SET Patient? No      Pain Assessment   Currently in Pain? No/denies    Multiple Pain Sites No             Capillary Blood Glucose: No results found for this or any previous visit (from the past 24 hour(s)).    Social History   Tobacco Use  Smoking Status Former   Packs/day: 0.00   Years: 30.00   Pack years: 0.00   Types: Cigarettes   Start date: 12/01/2003   Quit date: 01/06/2013   Years since quitting: 8.6  Smokeless Tobacco Never    Goals Met:  Proper associated with RPD/PD & O2 Sat Exercise tolerated well No report of concerns or symptoms today Strength training completed today  Goals Unmet:  Not Applicable  Comments: Service time is from 1021 to 1136.    Dr. Fransico Him is Medical Director for Cardiac Rehab at [facility].

## 2021-09-09 ENCOUNTER — Other Ambulatory Visit: Payer: Self-pay

## 2021-09-09 ENCOUNTER — Encounter (HOSPITAL_COMMUNITY)
Admission: RE | Admit: 2021-09-09 | Discharge: 2021-09-09 | Disposition: A | Discharge status: Home / Self Care | Payer: PPO | Source: Ambulatory Visit | Attending: Surgery | Admitting: Surgery

## 2021-09-09 DIAGNOSIS — J438 Other emphysema: Secondary | ICD-10-CM | POA: Diagnosis not present

## 2021-09-09 NOTE — Progress Notes (Signed)
Daily Session Note  Patient Details  Name: [name] [name] MRN: [id number] Date of Birth: 1956-11-04 Referring Provider:   [name] [facility] Rehab Walk Test from 08/15/2021 in [place]  Referring Provider [name], MD       Encounter Date: 09/09/2021  Check In:  Session Check In - 09/09/21 1118       Check-In   Supervising physician immediately available to respond to emergencies [facility] immediately available    Physician(s) Dr. Nevada Crane    Location [facility]    Staff Present Rosebud Poles, RN, Quentin Ore, MS, ACSM-CEP, Exercise Physiologist;Myrissa Chipley Ysidro Evert, RN;Olinty Celesta Aver, MS, ACSM CEP, Exercise Physiologist    Virtual Visit No    Medication changes reported     No    Fall or balance concerns reported    No    Tobacco Cessation No Change    Warm-up and Cool-down Performed as group-led instruction    Resistance Training Performed Yes    VAD Patient? No    PAD/SET Patient? No      Pain Assessment   Currently in Pain? No/denies    Pain Score 0-No pain    Multiple Pain Sites No             Capillary Blood Glucose: No results found for this or any previous visit (from the past 24 hour(s)).    Social History   Tobacco Use  Smoking Status Former   Packs/day: 0.00   Years: 30.00   Pack years: 0.00   Types: Cigarettes   Start date: 12/01/2003   Quit date: 01/06/2013   Years since quitting: 8.6  Smokeless Tobacco Never    Goals Met:  Exercise tolerated well No report of concerns or symptoms today Strength training completed today  Goals Unmet:  Not Applicable  Comments: Service time is from 1020 to 1138    Dr. Fransico Him is Medical Director for Cardiac Rehab at [facility].

## 2021-09-10 ENCOUNTER — Ambulatory Visit (HOSPITAL_COMMUNITY): Payer: PPO | Attending: Cardiology

## 2021-09-10 DIAGNOSIS — I2510 Atherosclerotic heart disease of native coronary artery without angina pectoris: Secondary | ICD-10-CM

## 2021-09-10 DIAGNOSIS — I712 Thoracic aortic aneurysm, without rupture, unspecified: Secondary | ICD-10-CM

## 2021-09-10 LAB — ECHOCARDIOGRAM COMPLETE
Area-P 1/2: 3.33 cm2
S' Lateral: 2.7 cm

## 2021-09-11 ENCOUNTER — Encounter (HOSPITAL_COMMUNITY)
Admission: RE | Admit: 2021-09-11 | Discharge: 2021-09-11 | Disposition: A | Discharge status: Home / Self Care | Payer: PPO | Source: Ambulatory Visit | Attending: Surgery | Admitting: Surgery

## 2021-09-11 ENCOUNTER — Other Ambulatory Visit: Payer: Self-pay

## 2021-09-11 DIAGNOSIS — J438 Other emphysema: Secondary | ICD-10-CM | POA: Diagnosis not present

## 2021-09-11 NOTE — Progress Notes (Signed)
[name] [name]'s oxygen saturations dropped to 85% on RA while walking on track.  She was practicing purse lip breathing and after 2 minutes of rest she returned to 91-92%.  On her next exercise session we will monitor closely and put on supplemental oxygen if needed. Discussed with Baljit.

## 2021-09-11 NOTE — Progress Notes (Signed)
Daily Session Note  Patient Details  Name: [name] [name] MRN: [id number] Date of Birth: [birth year] Referring Provider:   [name] Pulmonary Rehab Walk Test from 08/15/2021 in [place]  Referring Provider [name], MD       Encounter Date: 09/11/2021  Check In:  Session Check In - 09/11/21 1033       Check-In   Supervising physician immediately available to respond to emergencies [facility] immediately available    Physician(s) Dr. Verlon Au    Location [facility]    Staff Present Rosebud Poles, RN, Quentin Ore, MS, ACSM-CEP, Exercise Physiologist;Lisa Ysidro Evert, RN    Virtual Visit No    Medication changes reported     No    Fall or balance concerns reported    No    Tobacco Cessation No Change    Warm-up and Cool-down Performed as group-led instruction    Resistance Training Performed Yes    VAD Patient? No    PAD/SET Patient? No      Pain Assessment   Currently in Pain? No/denies    Multiple Pain Sites No             Capillary Blood Glucose: No results found for this or any previous visit (from the past 24 hour(s)).    Social History   Tobacco Use  Smoking Status Former   Packs/day: 0.00   Years: 30.00   Pack years: 0.00   Types: Cigarettes   Start date: 12/01/2003   Quit date: 01/06/2013   Years since quitting: 8.6  Smokeless Tobacco Never    Goals Met:  Proper associated with RPD/PD & O2 Sat Exercise tolerated well No report of concerns or symptoms today Strength training completed today  Goals Unmet:  Not Applicable  Comments: Service time is from 1015 to 1140.    Dr. Fransico Him is Medical Director for Cardiac Rehab at [facility].

## 2021-09-16 ENCOUNTER — Encounter (HOSPITAL_COMMUNITY)
Admission: RE | Admit: 2021-09-16 | Discharge: 2021-09-16 | Disposition: A | Discharge status: Home / Self Care | Payer: PPO | Source: Ambulatory Visit | Attending: Surgery | Admitting: Surgery

## 2021-09-16 ENCOUNTER — Other Ambulatory Visit: Payer: Self-pay

## 2021-09-16 VITALS — Wt 197.1 lb

## 2021-09-16 DIAGNOSIS — J438 Other emphysema: Secondary | ICD-10-CM

## 2021-09-16 NOTE — Progress Notes (Signed)
Pulmonary Individual Treatment Plan  Patient Details  Name: [name] [name] MRN: [id number] Date of Birth: [birth year] Referring Provider:   [name] Pulmonary Rehab Walk Test from 08/15/2021 in [place]  Referring Provider [name], MD       Initial Encounter Date:  Flowsheet Row Pulmonary Rehab Walk Test from 08/15/2021 in [place]  Date 08/15/21       Visit Diagnosis: Other emphysema ([place])  Patient's Home Medications on Admission:   Current Outpatient Medications:    albuterol (VENTOLIN HFA) 108 (90 Base) MCG/ACT inhaler, Inhale 2 puffs into the lungs every 4 (four) hours as needed for wheezing or shortness of breath., Disp: , Rfl:    cholecalciferol (VITAMIN D) 1000 units tablet, Take 1,000 Units by mouth daily., Disp: , Rfl:    losartan (COZAAR) 25 MG tablet, Take 25 mg by mouth daily., Disp: , Rfl:    Multiple Vitamin (MULTIVITAMIN) tablet, Take 1 tablet by mouth daily., Disp: , Rfl:    omeprazole (PRILOSEC) 20 MG capsule, Take 20 mg by mouth 2 (two) times daily., Disp: , Rfl:    simvastatin (ZOCOR) 40 MG tablet, Take 40 mg by mouth at bedtime., Disp: , Rfl:    SPIRIVA RESPIMAT 2.5 MCG/ACT AERS, Inhale 2.5 mg into the lungs in the morning., Disp: , Rfl:    SYNTHROID 100 MCG tablet, Take 100 mcg by mouth daily., Disp: , Rfl:   Past Medical History: Past Medical History:  Diagnosis Date   Arthritis    right knee, left hip   Carpal tunnel syndrome of left wrist 05/2017   COPD, severe (HCC)    SOB with ADLs per pt. - no O2   Cubital tunnel syndrome on left 05/2017   Dental crowns present    also dental implants   Esophageal reflux    Family history of adverse reaction to anesthesia    pt's mother has hx. of agitation and hallucinations post-op   History of asthma    as a child   Hypothyroidism (acquired)    Tachycardia    Holter monitor 06/08/2017 - 06/10/2017:  does not have results yet     Tobacco Use: Social History   Tobacco Use  Smoking Status Former   Packs/day: 0.00   Years: 30.00   Pack years: 0.00   Types: Cigarettes   Start date: 12/01/2003   Quit date: 01/06/2013   Years since quitting: 8.6  Smokeless Tobacco Never    Labs: Recent Review Flowsheet Data   There is no flowsheet data to display.     Capillary Blood Glucose: Lab Results  Component Value Date   GLUCAP 107 (H) 06/18/2017     Pulmonary Assessment Scores:  Pulmonary Assessment Scores     Row Name 08/15/21 1208         ADL UCSD   ADL Phase Entry     SOB Score total 48           CAT Score   CAT Score pre 15           mMRC Score   mMRC Score 0             UCSD: Self-administered rating of dyspnea associated with activities of daily living (ADLs) 6-point scale (0 = "not at all" to 5 = "maximal or unable to do because of breathlessness")  Scoring Scores range from 0 to 120.  Minimally important difference is 5 units  CAT: CAT  can identify the health impairment of COPD patients and is better correlated with disease progression.  CAT has a scoring range of zero to 40. The CAT score is classified into four groups of low (less than 10), medium (10 - 20), high (21-30) and very high (31-40) based on the impact level of disease on health status. A CAT score over 10 suggests significant symptoms.  A worsening CAT score could be explained by an exacerbation, poor medication adherence, poor inhaler technique, or progression of COPD or comorbid conditions.  CAT MCID is 2 points  mMRC: mMRC (Modified Medical Research Council) Dyspnea Scale is used to assess the degree of baseline functional disability in patients of respiratory disease due to dyspnea. No minimal important difference is established. A decrease in score of 1 point or greater is considered a positive change.   Pulmonary Function Assessment:   Exercise Target Goals: Exercise Program Goal: Individual exercise  prescription set using results from initial 6 min walk test and THRR while considering  patient's activity barriers and safety.   Exercise Prescription Goal: Initial exercise prescription builds to 30-45 minutes a day of aerobic activity, 2-3 days per week.  Home exercise guidelines will be given to patient during program as part of exercise prescription that the participant will acknowledge.  Activity Barriers & Risk Stratification:  Activity Barriers & Cardiac Risk Stratification - 08/15/21 1009       Activity Barriers & Cardiac Risk Stratification   Activity Barriers Other (comment);Arthritis;Deconditioning    Comments Sciatic and left hip arthritis    Cardiac Risk Stratification High             6 Minute Walk:  6 Minute Walk     Row Name 08/15/21 1100         6 Minute Walk   Phase Initial     Distance 1000 feet     Walk Time 6 minutes     # of Rest Breaks 2  Break 1: 2:30 -4:00 and 5:35 - 6:00     MPH 1.9     METS 3.3     RPE 14     Perceived Dyspnea  2     VO2 Peak 11.5     Symptoms Yes (comment)     Comments SOB, RPD = 2     Resting HR 70 bpm     Resting BP 136/80     Resting Oxygen Saturation  97 %     Exercise Oxygen Saturation  during 6 min walk 85 %  See interval oxygen readings     Max Ex. HR 133 bpm     Max Ex. BP 190/90     2 Minute Post BP 162/90  4 - 5 min BP 128/84           Interval HR   1 Minute HR 98     2 Minute HR 115     3 Minute HR 114     4 Minute HR 110     5 Minute HR 124     6 Minute HR 133     2 Minute Post HR 93     Interval Heart Rate? Yes           Interval Oxygen   Interval Oxygen? Yes     1 Minute Oxygen Saturation % 95 %     1 Minute Liters of Oxygen 0 L     2 Minute Oxygen Saturation % 86 %  2 Minute Liters of Oxygen 0 L     3 Minute Oxygen Saturation % 89 %     3 Minute Liters of Oxygen 0 L     4 Minute Oxygen Saturation % 88 %     4 Minute Liters of Oxygen 0 L     5 Minute Oxygen Saturation % 92 %     5  Minute Liters of Oxygen 0 L     6 Minute Oxygen Saturation % 85 %     6 Minute Liters of Oxygen 0 L     2 Minute Post Oxygen Saturation % 96 %     2 Minute Post Liters of Oxygen 0 L              Oxygen Initial Assessment:  Oxygen Initial Assessment - 08/15/21 1207       Intervention   Short Term Goals To learn and demonstrate proper pursed lip breathing techniques or other breathing techniques. ;To learn and understand importance of maintaining oxygen saturations>88%;To learn and demonstrate proper use of respiratory medications;To learn and understand importance of monitoring SPO2 with pulse oximeter and demonstrate accurate use of the pulse oximeter.;To learn and exhibit compliance with exercise, home and travel O2 prescription    Long  Term Goals Exhibits proper breathing techniques, such as pursed lip breathing or other method taught during program session;Maintenance of O2 saturations>88%;Exhibits compliance with exercise, home  and travel O2 prescription;Verbalizes importance of monitoring SPO2 with pulse oximeter and return demonstration             Oxygen Re-Evaluation:  Oxygen Re-Evaluation     Row Name 09/02/21 0800             Program Oxygen Prescription   Program Oxygen Prescription None               Home Oxygen   Home Oxygen Device None       Sleep Oxygen Prescription None       Home Exercise Oxygen Prescription None       Home Resting Oxygen Prescription None               Goals/Expected Outcomes   Short Term Goals To learn and demonstrate proper pursed lip breathing techniques or other breathing techniques. ;To learn and understand importance of maintaining oxygen saturations>88%;To learn and demonstrate proper use of respiratory medications;To learn and understand importance of monitoring SPO2 with pulse oximeter and demonstrate accurate use of the pulse oximeter.;To learn and exhibit compliance with exercise, home and travel O2 prescription       Long   Term Goals Exhibits proper breathing techniques, such as pursed lip breathing or other method taught during program session;Maintenance of O2 saturations>88%;Exhibits compliance with exercise, home  and travel O2 prescription;Verbalizes importance of monitoring SPO2 with pulse oximeter and return demonstration       Goals/Expected Outcomes Compliance and understanding of oxygen saturation monitoring and importance of breathing techniques to decrease shortness of breath.                Oxygen Discharge (Final Oxygen Re-Evaluation):  Oxygen Re-Evaluation - 09/02/21 0800       Program Oxygen Prescription   Program Oxygen Prescription None      Home Oxygen   Home Oxygen Device None    Sleep Oxygen Prescription None    Home Exercise Oxygen Prescription None    Home Resting Oxygen Prescription None      Goals/Expected Outcomes   Short  Term Goals To learn and demonstrate proper pursed lip breathing techniques or other breathing techniques. ;To learn and understand importance of maintaining oxygen saturations>88%;To learn and demonstrate proper use of respiratory medications;To learn and understand importance of monitoring SPO2 with pulse oximeter and demonstrate accurate use of the pulse oximeter.;To learn and exhibit compliance with exercise, home and travel O2 prescription    Long  Term Goals Exhibits proper breathing techniques, such as pursed lip breathing or other method taught during program session;Maintenance of O2 saturations>88%;Exhibits compliance with exercise, home  and travel O2 prescription;Verbalizes importance of monitoring SPO2 with pulse oximeter and return demonstration    Goals/Expected Outcomes Compliance and understanding of oxygen saturation monitoring and importance of breathing techniques to decrease shortness of breath.             Initial Exercise Prescription:  Initial Exercise Prescription - 08/15/21 1200       Date of Initial Exercise RX and Referring  Provider   Date 08/15/21    Referring Provider [name], MD      NuStep   Level 2    SPM 75    Minutes 15    METs 2      Track   Laps 10    Minutes 15    METs 2.16      Prescription Details   Frequency (times per week) 2    Duration Progress to 30 minutes of continuous aerobic without signs/symptoms of physical distress      Intensity   THRR 40-80% of Max Heartrate 61-122    Ratings of Perceived Exertion 11-13    Perceived Dyspnea 0-4      Progression   Progression Continue progressive overload as per policy without signs/symptoms or physical distress.      Resistance Training   Training Prescription Yes    Weight Yellow bands    Reps 10-15             Perform Capillary Blood Glucose checks as needed.  Exercise Prescription Changes:   Exercise Prescription Changes     Row Name 08/19/21 1200 09/02/21 1200 09/16/21 1100         Response to Exercise   Blood Pressure (Admit) 146/82 130/82 128/70     Blood Pressure (Exercise) 164/90 154/72 144/74     Blood Pressure (Exit) 134/80 136/80 116/72     Heart Rate (Admit) 75 bpm 72 bpm 75 bpm     Heart Rate (Exercise) 85 bpm 105 bpm 113 bpm     Heart Rate (Exit) 83 bpm 94 bpm 85 bpm     Oxygen Saturation (Admit) 96 % 96 % 97 %     Oxygen Saturation (Exercise) 93 % 96 % 86 %     Oxygen Saturation (Exit) 96 % -- 93 %     Rating of Perceived Exertion (Exercise) '11 14 12     ' Perceived Dyspnea (Exercise) '2 3 1     ' Duration Continue with 30 min of aerobic exercise without signs/symptoms of physical distress. Progress to 30 minutes of  aerobic without signs/symptoms of physical distress Progress to 30 minutes of  aerobic without signs/symptoms of physical distress     Intensity Other (comment)  40-80% of HRR THRR unchanged THRR unchanged           Progression   Progression Continue to progress workloads to maintain intensity without signs/symptoms of physical distress. Continue to progress workloads to maintain  intensity without signs/symptoms of physical distress. Continue to progress workloads  to maintain intensity without signs/symptoms of physical distress.           Resistance Training   Training Prescription Yes No No     Weight Yellow bands Yellow bands Yellow bands     Reps 10-15 10-15 10-15     Time 10 Minutes 10 Minutes 10 Minutes           Oxygen   Liters -- -- 2  placed on 2L O2 when walking on track today, she desaturated to 85-86 % on RA.           NuStep   Level '2 3 4     ' SPM 80 80 80     Minutes '30 15 15     ' METs 1.7 2.2 2.4           Track   Laps -- 9 11  was able to walk much further and with more ease on 2L of O2 today.     Minutes -- 15 15              Exercise Comments:   Exercise Comments     Row Name 08/15/21 1205 08/19/21 1509         Exercise Comments Pt with current history of Thoracic aortic aneurysm - BP [place] <180/90 [name] [name] completed her 1st day of exercise. She exercised 30 min on the Nustep at level 2 without complaints. Pt tolerated exercise fair as we are currently waiting for her to see her PCP and she is expecting to get a CT for her aortic aneurysm. She performed the warmup and cooldown standing without limitations. Will monitor and progress as able.               Exercise Goals and Review:   Exercise Goals     Row Name 08/15/21 1011 09/02/21 0754           Exercise Goals   Increase Physical Activity Yes Yes      Intervention Provide advice, education, support and counseling about physical activity/exercise needs.;Develop an individualized exercise prescription for aerobic and resistive training based on initial evaluation findings, risk stratification, comorbidities and participant's personal goals. Provide advice, education, support and counseling about physical activity/exercise needs.;Develop an individualized exercise prescription for aerobic and resistive training based on initial evaluation findings, risk stratification,  comorbidities and participant's personal goals.      Expected Outcomes Long Term: Exercising regularly at least 3-5 days a week.;Long Term: Add in home exercise to make exercise part of routine and to increase amount of physical activity.;Short Term: Attend rehab on a regular basis to increase amount of physical activity. Long Term: Exercising regularly at least 3-5 days a week.;Long Term: Add in home exercise to make exercise part of routine and to increase amount of physical activity.;Short Term: Attend rehab on a regular basis to increase amount of physical activity.      Increase Strength and Stamina Yes Yes      Intervention Provide advice, education, support and counseling about physical activity/exercise needs.;Develop an individualized exercise prescription for aerobic and resistive training based on initial evaluation findings, risk stratification, comorbidities and participant's personal goals. Provide advice, education, support and counseling about physical activity/exercise needs.;Develop an individualized exercise prescription for aerobic and resistive training based on initial evaluation findings, risk stratification, comorbidities and participant's personal goals.      Expected Outcomes Long Term: Improve cardiorespiratory fitness, muscular endurance and strength as measured by increased METs and functional capacity (6MWT);Short Term: Perform  resistance training exercises routinely during rehab and add in resistance training at home;Short Term: Increase workloads from initial exercise prescription for resistance, speed, and METs. Long Term: Improve cardiorespiratory fitness, muscular endurance and strength as measured by increased METs and functional capacity (6MWT);Short Term: Perform resistance training exercises routinely during rehab and add in resistance training at home;Short Term: Increase workloads from initial exercise prescription for resistance, speed, and METs.      Able to understand  and use rate of perceived exertion (RPE) scale Yes Yes      Intervention Provide education and explanation on how to use RPE scale Provide education and explanation on how to use RPE scale      Able to understand and use Dyspnea scale Yes Yes      Intervention Provide education and explanation on how to use Dyspnea scale Provide education and explanation on how to use Dyspnea scale      Expected Outcomes Short Term: Able to use Dyspnea scale daily in rehab to express subjective sense of shortness of breath during exertion;Long Term: Able to use Dyspnea scale to guide intensity level when exercising independently Short Term: Able to use Dyspnea scale daily in rehab to express subjective sense of shortness of breath during exertion;Long Term: Able to use Dyspnea scale to guide intensity level when exercising independently      Knowledge and understanding of Target Heart Rate Range (THRR) Yes Yes      Intervention Provide education and explanation of THRR including how the numbers were predicted and where they are located for reference Provide education and explanation of THRR including how the numbers were predicted and where they are located for reference      Expected Outcomes Short Term: Able to state/look up THRR;Long Term: Able to use THRR to govern intensity when exercising independently;Short Term: Able to use daily as guideline for intensity in rehab Short Term: Able to state/look up THRR;Long Term: Able to use THRR to govern intensity when exercising independently;Short Term: Able to use daily as guideline for intensity in rehab      Understanding of Exercise Prescription Yes Yes      Intervention Provide education, explanation, and written materials on patient's individual exercise prescription Provide education, explanation, and written materials on patient's individual exercise prescription      Expected Outcomes Short Term: Able to explain program exercise prescription;Long Term: Able to explain  home exercise prescription to exercise independently Short Term: Able to explain program exercise prescription;Long Term: Able to explain home exercise prescription to exercise independently               Exercise Goals Re-Evaluation :  Exercise Goals Re-Evaluation     Row Name 09/02/21 0754             Exercise Goal Re-Evaluation   Exercise Goals Review Increase Physical Activity;Able to understand and use Dyspnea scale;Understanding of Exercise Prescription;Increase Strength and Stamina;Knowledge and understanding of Target Heart Rate Range (THRR);Able to understand and use rate of perceived exertion (RPE) scale       Comments [name] [name] has completed 4 exercise sessions. She exercises for 15 min on the Nustep and track. She has progressed from exercising only on the Nustep since her BP has stabilized. [name] [name] averages 2.2 METs at level 3 on the Nustep and 1.81 METs on the track. She has to stop and take seated rest breaks on the track because she is deconditioned. [name] [name] performs the warmup and cooldown standing without limitations. She is motivated  to exercise and improve her functional capacity. Will continue to monitor and progress as able.       Expected Outcomes Through exercise at rehab and home, the pt will decrease shortness of breath with daily activities and feel confident in carrying out an exercise regimen at home.                Discharge Exercise Prescription (Final Exercise Prescription Changes):  Exercise Prescription Changes - 09/16/21 1100       Response to Exercise   Blood Pressure (Admit) 128/70    Blood Pressure (Exercise) 144/74    Blood Pressure (Exit) 116/72    Heart Rate (Admit) 75 bpm    Heart Rate (Exercise) 113 bpm    Heart Rate (Exit) 85 bpm    Oxygen Saturation (Admit) 97 %    Oxygen Saturation (Exercise) 86 %    Oxygen Saturation (Exit) 93 %    Rating of Perceived Exertion (Exercise) 12    Perceived Dyspnea (Exercise) 1    Duration Progress to 30  minutes of  aerobic without signs/symptoms of physical distress    Intensity THRR unchanged      Progression   Progression Continue to progress workloads to maintain intensity without signs/symptoms of physical distress.      Resistance Training   Training Prescription No    Weight Yellow bands    Reps 10-15    Time 10 Minutes      Oxygen   Liters 2   placed on 2L O2 when walking on track today, she desaturated to 85-86 % on RA.     NuStep   Level 4    SPM 80    Minutes 15    METs 2.4      Track   Laps 11   was able to walk much further and with more ease on 2L of O2 today.   Minutes 15             Nutrition:  Target Goals: Understanding of nutrition guidelines, daily intake of sodium <1530m, cholesterol <2063m calories 30% from fat and 7% or less from saturated fats, daily to have 5 or more servings of fruits and vegetables.  Biometrics:  Pre Biometrics - 08/15/21 1100       Pre Biometrics   Grip Strength 26 kg              Nutrition Therapy Plan and Nutrition Goals:  Nutrition Therapy & Goals - 08/19/21 1134       Nutrition Therapy   Diet General Healthful    Drug/Food Interactions Statins/Certain Fruits      Personal Nutrition Goals   Nutrition Goal Pt to build a healthy plate including vegetables, fruits, whole grains, and low-fat dairy products in a heart healthy meal plan.      Intervention Plan   Intervention Prescribe, educate and counsel regarding individualized specific dietary modifications aiming towards targeted core components such as weight, hypertension, lipid management, diabetes, heart failure and other comorbidities.;Nutrition handout(s) given to patient.    Expected Outcomes Long Term Goal: Adherence to prescribed nutrition plan.;Short Term Goal: Understand basic principles of dietary content, such as calories, fat, sodium, cholesterol and nutrients.             Nutrition Assessments:  MEDIFICTS Score Key: ?70 Need to make  dietary changes  40-70 Heart Healthy Diet ? 40 Therapeutic Level Cholesterol Diet  Flowsheet Row PULMONARY REHAB OTHER RESPIRATORY from 08/19/2021 in [facility]  Picture Your Plate Total Score on Admission 66      Picture Your Plate Scores: <66 Unhealthy dietary pattern with much room for improvement. 41-50 Dietary pattern unlikely to meet recommendations for good health and room for improvement. 51-60 More healthful dietary pattern, with some room for improvement.  >60 Healthy dietary pattern, although there may be some specific behaviors that could be improved.    Nutrition Goals Re-Evaluation:  Nutrition Goals Re-Evaluation     [place] Name 08/19/21 1134             Goals   Current Weight 196 lb (88.9 kg)                Nutrition Goals Discharge (Final Nutrition Goals Re-Evaluation):  Nutrition Goals Re-Evaluation - 08/19/21 1134       Goals   Current Weight 196 lb (88.9 kg)             Psychosocial: Target Goals: Acknowledge presence or absence of significant depression and/or stress, maximize coping skills, provide positive support system. Participant is able to verbalize types and ability to use techniques and skills needed for reducing stress and depression.  Initial Review & Psychosocial Screening:  Initial Psych Review & Screening - 08/15/21 1209       Screening Interventions   Expected Outcomes Short Term goal: Identification and review with participant of any Quality of Life or Depression concerns found by scoring the questionnaire.;Long Term goal: The participant improves quality of Life and PHQ9 Scores as seen by post scores and/or verbalization of changes             Quality of Life Scores:  Scores of 19 and below usually indicate a poorer quality of life in these areas.  A difference of  2-3 points is a clinically meaningful difference.  A difference of 2-3 points in the total score of the Quality of Life Index  has been associated with significant improvement in overall quality of life, self-image, physical symptoms, and general health in studies assessing change in quality of life.  PHQ-9: Recent Review Flowsheet Data     Depression screen [facility] 2/9 08/15/2021   Decreased Interest 0   Down, Depressed, Hopeless 0   PHQ - 2 Score 0   Altered sleeping 0   Tired, decreased energy 0   Change in appetite 0   Feeling bad or failure about yourself  0   Trouble concentrating 0   Moving slowly or fidgety/restless 0   Suicidal thoughts 0   PHQ-9 Score 0   Difficult doing work/chores Not difficult at all      Interpretation of Total Score  Total Score Depression Severity:  1-4 = Minimal depression, 5-9 = Mild depression, 10-14 = Moderate depression, 15-19 = Moderately severe depression, 20-27 = Severe depression   Psychosocial Evaluation and Intervention:   Psychosocial Re-Evaluation:  Psychosocial Re-Evaluation     [place] Name 09/01/21 1201             Psychosocial Re-Evaluation   Current issues with None Identified       Comments No barriers or psychosocial concerns identified at this time.  She does not need a AAA aneurysm repair yet.  It is being monitored yearly by Dr. Cyndia Bent.  She is very happy about this.       Expected Outcomes For [name] [name] to continue to have no barriers or psychosocial concerns while participating in pulmonary rehab.       Interventions Encouraged to attend Pulmonary Rehabilitation  for the exercise       Continue Psychosocial Services  No Follow up required                Psychosocial Discharge (Final Psychosocial Re-Evaluation):  Psychosocial Re-Evaluation - 09/01/21 1201       Psychosocial Re-Evaluation   Current issues with None Identified    Comments No barriers or psychosocial concerns identified at this time.  She does not need a AAA aneurysm repair yet.  It is being monitored yearly by Dr. Cyndia Bent.  She is very happy about this.    Expected Outcomes For  [name] [name] to continue to have no barriers or psychosocial concerns while participating in pulmonary rehab.    Interventions Encouraged to attend Pulmonary Rehabilitation for the exercise    Continue Psychosocial Services  No Follow up required             Education: Education Goals: Education classes will be provided on a weekly basis, covering required topics. Participant will state understanding/return demonstration of topics presented.  Learning Barriers/Preferences:  Learning Barriers/Preferences - 08/15/21 1020       Learning Barriers/Preferences   Learning Barriers Sight    Learning Preferences Written Material;Computer/Internet;Individual Instruction             Education Topics: Risk Factor Reduction:  -Group instruction that is supported by a PowerPoint presentation. Instructor discusses the definition of a risk factor, different risk factors for pulmonary disease, and how the heart and lungs work together.     Nutrition for Pulmonary Patient:  -Group instruction provided by PowerPoint slides, verbal discussion, and written materials to support subject matter. The instructor gives an explanation and review of healthy diet recommendations, which includes a discussion on weight management, recommendations for fruit and vegetable consumption, as well as protein, fluid, caffeine, fiber, sodium, sugar, and alcohol. Tips for eating when patients are short of breath are discussed.   Pursed Lip Breathing:  -Group instruction that is supported by demonstration and informational handouts. Instructor discusses the benefits of pursed lip and diaphragmatic breathing and detailed demonstration on how to preform both.   Flowsheet Row PULMONARY REHAB OTHER RESPIRATORY from 08/21/2021 in [place]  Date 08/21/21  Educator Donnetta Simpers  Instruction Review Code 1- Verbalizes Understanding       Oxygen Safety:  -Group instruction provided by PowerPoint,  verbal discussion, and written material to support subject matter. There is an overview of "What is Oxygen" and "Why do we need it".  Instructor also reviews how to create a safe environment for oxygen use, the importance of using oxygen as prescribed, and the risks of noncompliance. There is a brief discussion on traveling with oxygen and resources the patient may utilize.   Oxygen Equipment:  -Group instruction provided by [facility] utilizing handouts, written materials, and equipment demonstrations.   Signs and Symptoms:  -Group instruction provided by written material and verbal discussion to support subject matter. Warning signs and symptoms of infection, stroke, and heart attack are reviewed and when to call the physician/911 reinforced. Tips for preventing the spread of infection discussed.   Advanced Directives:  -Group instruction provided by verbal instruction and written material to support subject matter. Instructor reviews Advanced Directive laws and proper instruction for filling out document.   Pulmonary Video:  -Group video education that reviews the importance of medication and oxygen compliance, exercise, good nutrition, pulmonary hygiene, and pursed lip and diaphragmatic breathing for the pulmonary patient.   Exercise for the  Pulmonary Patient:  -Group instruction that is supported by a PowerPoint presentation. Instructor discusses benefits of exercise, core components of exercise, frequency, duration, and intensity of an exercise routine, importance of utilizing pulse oximetry during exercise, safety while exercising, and options of places to exercise outside of rehab.   Flowsheet Row PULMONARY REHAB OTHER RESPIRATORY from 09/04/2021 in [place]  Date 09/04/21  Educator handout       Pulmonary Medications:  -Verbally interactive group education provided by instructor with focus on inhaled medications and proper  administration.   Anatomy and Physiology of the Respiratory System and Intimacy:  -Group instruction provided by PowerPoint, verbal discussion, and written material to support subject matter. Instructor reviews respiratory cycle and anatomical components of the respiratory system and their functions. Instructor also reviews differences in obstructive and restrictive respiratory diseases with examples of each. Intimacy, Sex, and Sexuality differences are reviewed with a discussion on how relationships can change when diagnosed with pulmonary disease. Common sexual concerns are reviewed.   MD DAY -A group question and answer session with a medical doctor that allows participants to ask questions that relate to their pulmonary disease state.   OTHER EDUCATION -Group or individual verbal, written, or video instructions that support the educational goals of the pulmonary rehab program. [place] from 09/04/2021 in [place]  Date 08/28/21  Educator Deanne Coffer your numbers]  Instruction Review Code 1- Verbalizes Understanding       Holiday Eating Survival Tips:  -Group instruction provided by PowerPoint slides, verbal discussion, and written materials to support subject matter. The instructor gives patients tips, tricks, and techniques to help them not only survive but enjoy the holidays despite the onslaught of food that accompanies the holidays.   Knowledge Questionnaire Score:  Knowledge Questionnaire Score - 08/15/21 1027       Knowledge Questionnaire Score   Pre Score 17/18 (P)              Core Components/Risk Factors/Patient Goals at Admission:  Personal Goals and Risk Factors at Admission - 08/15/21 1209       Core Components/Risk Factors/Patient Goals on Admission   Intervention Provide education and demonstration as needed of appropriate use of medications, inhalers, and oxygen therapy.     Expected Outcomes Long Term: Maintain appropriate use of medications, inhalers, and oxygen therapy.;Short Term: Achieves understanding of medications use. Understands that oxygen is a medication prescribed by physician. Demonstrates appropriate use of inhaler and oxygen therapy.    Expected Outcomes Long Term: Maintenance of blood pressure at goal levels.;Short Term: Continued assessment and intervention until BP is < 140/26m HG in hypertensive participants. < 130/830mHG in hypertensive participants with diabetes, heart failure or chronic kidney disease.             Core Components/Risk Factors/Patient Goals Review:   Goals and Risk Factor Review     Row Name 09/01/21 1204             Core Components/Risk Factors/Patient Goals Review   Personal Goals Review Increase knowledge of respiratory medications and ability to use respiratory devices properly.;Improve shortness of breath with ADL's;Develop more efficient breathing techniques such as purse lipped breathing and diaphragmatic breathing and practicing self-pacing with activity.       Review RoLilithas attended 2 weeks of pulmonary rehab and her blood pressure has improved since starting the program.  She has been subscribed a second BP lowering  medication which has helped.       Expected Outcomes See admission goals.                Core Components/Risk Factors/Patient Goals at Discharge (Final Review):   Goals and Risk Factor Review - 09/01/21 1204       Core Components/Risk Factors/Patient Goals Review   Personal Goals Review Increase knowledge of respiratory medications and ability to use respiratory devices properly.;Improve shortness of breath with ADL's;Develop more efficient breathing techniques such as purse lipped breathing and diaphragmatic breathing and practicing self-pacing with activity.    Review Aleira has attended 2 weeks of pulmonary rehab and her blood pressure has improved since starting the program.  She has  been subscribed a second BP lowering medication which has helped.    Expected Outcomes See admission goals.             ITP Comments:   Comments: ITP REVIEW Pt is making expected progress toward Pulmonary Rehab goals after completing 9 sessions. Recommend continued exercise, life style modification, education, and utilization of breathing techniques to increase stamina and strength, while also decreasing shortness of breath with exertion.

## 2021-09-16 NOTE — Progress Notes (Signed)
Daily Session Note  Patient Details  Name: [name] [name] MRN: [id number] Date of Birth: Mar 08, 1956 Referring Provider:   [name] Pulmonary Rehab Walk Test from 08/15/2021 in [place]  Referring Provider [name], MD       Encounter Date: 09/16/2021  Check In:  Session Check In - 09/16/21 1117       Check-In   Supervising physician immediately available to respond to emergencies [facility] immediately available    Physician(s) Dr. Verlon Au    Location [facility]    Staff Present Rosebud Poles, RN, Quentin Ore, MS, ACSM-CEP, Exercise Physiologist;Lisa Ysidro Evert, RN    Virtual Visit No    Medication changes reported     No    Fall or balance concerns reported    No    Tobacco Cessation No Change    Warm-up and Cool-down Performed as group-led instruction    Resistance Training Performed Yes    VAD Patient? No    PAD/SET Patient? No      Pain Assessment   Currently in Pain? No/denies    Multiple Pain Sites No             Capillary Blood Glucose: No results found for this or any previous visit (from the past 24 hour(s)).   Exercise Prescription Changes - 09/16/21 1100       Response to Exercise   Blood Pressure (Admit) 128/70    Blood Pressure (Exercise) 144/74    Blood Pressure (Exit) 116/72    Heart Rate (Admit) 75 bpm    Heart Rate (Exercise) 113 bpm    Heart Rate (Exit) 85 bpm    Oxygen Saturation (Admit) 97 %    Oxygen Saturation (Exercise) 86 %    Oxygen Saturation (Exit) 93 %    Rating of Perceived Exertion (Exercise) 12    Perceived Dyspnea (Exercise) 1    Duration Progress to 30 minutes of  aerobic without signs/symptoms of physical distress    Intensity THRR unchanged      Progression   Progression Continue to progress workloads to maintain intensity without signs/symptoms of physical distress.      Resistance Training   Training Prescription No    Weight Yellow bands    Reps  10-15    Time 10 Minutes      Oxygen   Liters 2   placed on 2L O2 when walking on track today, she desaturated to 85-86 % on RA.     NuStep   Level 4    SPM 80    Minutes 15    METs 2.4      Track   Laps 11   was able to walk much further and with more ease on 2L of O2 today.   Minutes 15             Social History   Tobacco Use  Smoking Status Former   Packs/day: 0.00   Years: 30.00   Pack years: 0.00   Types: Cigarettes   Start date: 12/01/2003   Quit date: 01/06/2013   Years since quitting: 8.6  Smokeless Tobacco Never    Goals Met:  Independence with exercise equipment Improved SOB with ADL's Using PLB without cueing & demonstrates good technique Exercise tolerated well No report of concerns or symptoms today Strength training completed today  Goals Unmet:  Not Applicable  Comments: Service time is from 1018 to 1130.    Dr. Fransico Him is  Medical Director for Cardiac Rehab at [facility].

## 2021-09-16 NOTE — Progress Notes (Signed)
[name] [name] required supplemental oxygen of 2L when walking on the track for exercise today. She dropped to 85-86% on RA after walking 400 ft. on the track.  She was placed on 1L and desaturated to 87%, was increased to 2L and was able to maintain her saturations @ 91-93% with less rest breaks and more ease.  Going forward she will use supplemental oxygen @ 2L when walking for exercise.

## 2021-09-18 ENCOUNTER — Other Ambulatory Visit: Payer: Self-pay

## 2021-09-18 ENCOUNTER — Encounter (HOSPITAL_COMMUNITY)
Admission: RE | Admit: 2021-09-18 | Discharge: 2021-09-18 | Disposition: A | Discharge status: Home / Self Care | Payer: PPO | Source: Ambulatory Visit | Attending: Surgery | Admitting: Surgery

## 2021-09-18 DIAGNOSIS — J438 Other emphysema: Secondary | ICD-10-CM | POA: Diagnosis not present

## 2021-09-18 NOTE — Progress Notes (Signed)
Daily Session Note  Patient Details  Name: [name] [name] MRN: [id number] Date of Birth: 1956-06-12 Referring Provider:   April [name] Pulmonary Rehab Walk Test from 08/15/2021 in [place]  Referring Provider [name] Raid, MD       Encounter Date: 09/18/2021  Check In:  Session Check In - 09/18/21 1139       Check-In   Supervising physician immediately available to respond to emergencies [facility] immediately available    Physician(s) Dr. [place] [name]    [facility]    Staff Present [name] Poles, RN, BSN;[name] [name], [name] Fears, MS, ACSM-CEP, Exercise Physiologist    Virtual Visit No    Medication changes reported     No    Tobacco Cessation No Change    Warm-up and Cool-down Performed as group-led instruction    Resistance Training Performed Yes    VAD Patient? No    PAD/SET Patient? No      Pain Assessment   Currently in Pain? No/denies    Multiple Pain Sites No             Capillary Blood Glucose: No results found for this or any previous visit (from the past 24 hour(s)).    Social History   Tobacco Use  Smoking Status Former   Packs/day: 0.00   Years: 30.00   Pack years: 0.00   Types: Cigarettes   Start date: 12/01/2003   Quit date: 01/06/2013   Years since quitting: 8.7  Smokeless Tobacco Never    Goals Met:  Proper associated with RPD/PD & O2 Sat Independence with exercise equipment Exercise tolerated well No report of concerns or symptoms today Strength training completed today  Goals Unmet:  Not Applicable  Comments: Service time is from 1015 to 1130.    Dr. Rodman [name] is the pulmonary rehab medical director @ [facility].

## 2021-09-19 DIAGNOSIS — I10 Essential (primary) hypertension: Secondary | ICD-10-CM | POA: Diagnosis not present

## 2021-09-19 DIAGNOSIS — J431 Panlobular emphysema: Secondary | ICD-10-CM | POA: Diagnosis not present

## 2021-09-23 ENCOUNTER — Other Ambulatory Visit: Payer: Self-pay

## 2021-09-23 ENCOUNTER — Encounter (HOSPITAL_COMMUNITY)
Admission: RE | Admit: 2021-09-23 | Discharge: 2021-09-23 | Disposition: A | Discharge status: Home / Self Care | Payer: PPO | Source: Ambulatory Visit | Attending: Surgery | Admitting: Surgery

## 2021-09-23 DIAGNOSIS — J438 Other emphysema: Secondary | ICD-10-CM | POA: Diagnosis not present

## 2021-09-23 NOTE — Progress Notes (Signed)
Daily Session Note  Patient Details  Name: [name] [name] MRN: [id number] Date of Birth: 02/12/1956 Referring Provider:   [name] Pulmonary Rehab Walk Test from 08/15/2021 in [place]  Referring Provider [name], MD       Encounter Date: 09/23/2021  Check In:  Session Check In - 09/23/21 1110       Check-In   Supervising physician immediately available to respond to emergencies [facility] immediately available    Physician(s) Dr. Karleen [place]    [facility]    Staff Present Rosebud Poles, RN, Quentin Ore, MS, ACSM-CEP, Exercise Physiologist;Staphany Ditton Ysidro Evert, RN    Virtual Visit No    Medication changes reported     No    Fall or balance concerns reported    No    Tobacco Cessation No Change    Warm-up and Cool-down Performed as group-led instruction    Resistance Training Performed Yes    VAD Patient? No    PAD/SET Patient? No      Pain Assessment   Currently in Pain? No/denies    Multiple Pain Sites No             Capillary Blood Glucose: No results found for this or any previous visit (from the past 24 hour(s)).    Social History   Tobacco Use  Smoking Status Former   Packs/day: 0.00   Years: 30.00   Pack years: 0.00   Types: Cigarettes   Start date: 12/01/2003   Quit date: 01/06/2013   Years since quitting: 8.7  Smokeless Tobacco Never    Goals Met:  Exercise tolerated well No report of concerns or symptoms today Strength training completed today  Goals Unmet:  Not Applicable  Comments: Service time is from 1015 to [place]    Dr. Rodman Pickle is Medical Director for Pulmonary Rehab at [facility].

## 2021-09-25 ENCOUNTER — Encounter (HOSPITAL_COMMUNITY)
Admission: RE | Admit: 2021-09-25 | Discharge: 2021-09-25 | Disposition: A | Discharge status: Home / Self Care | Payer: PPO | Source: Ambulatory Visit | Attending: Surgery | Admitting: Surgery

## 2021-09-25 ENCOUNTER — Other Ambulatory Visit: Payer: Self-pay

## 2021-09-25 DIAGNOSIS — J438 Other emphysema: Secondary | ICD-10-CM

## 2021-09-25 NOTE — Progress Notes (Signed)
Daily Session Note  Patient Details  Name: [name] [name] MRN: [id number] Date of Birth: 08/27/1956 Referring Provider:   [name] Pulmonary Rehab Walk Test from 08/15/2021 in [place]  Referring Provider [name], MD       Encounter Date: 09/25/2021  Check In:  Session Check In - 09/25/21 1100       Check-In   Supervising physician immediately available to respond to emergencies [facility] immediately available    Physician(s) Dr. Verlon Au    [facility]    Staff Present Rosebud Poles, RN, Quentin Ore, MS, ACSM-CEP, Exercise Physiologist;Nicolette Gieske Ysidro Evert, RN    Virtual Visit No    Medication changes reported     No    Fall or balance concerns reported    No    Tobacco Cessation No Change    Warm-up and Cool-down Performed as group-led instruction    Resistance Training Performed Yes    VAD Patient? No    PAD/SET Patient? No      Pain Assessment   Currently in Pain? No/denies    Multiple Pain Sites No             Capillary Blood Glucose: No results found for this or any previous visit (from the past 24 hour(s)).    Social History   Tobacco Use  Smoking Status Former   Packs/day: 0.00   Years: 30.00   Pack years: 0.00   Types: Cigarettes   Start date: 12/01/2003   Quit date: 01/06/2013   Years since quitting: 8.7  Smokeless Tobacco Never    Goals Met:  Exercise tolerated well No report of concerns or symptoms today Strength training completed today  Goals Unmet:  Not Applicable  Comments: Service time is from 1015 to [place]    Dr. Rodman Pickle is Medical Director for Pulmonary Rehab at [facility].

## 2021-09-30 ENCOUNTER — Encounter (HOSPITAL_COMMUNITY)
Admission: RE | Admit: 2021-09-30 | Discharge: 2021-09-30 | Disposition: A | Discharge status: Home / Self Care | Payer: PPO | Source: Ambulatory Visit | Attending: Surgery | Admitting: Surgery

## 2021-09-30 ENCOUNTER — Other Ambulatory Visit: Payer: Self-pay

## 2021-09-30 DIAGNOSIS — J438 Other emphysema: Secondary | ICD-10-CM | POA: Insufficient documentation

## 2021-09-30 NOTE — Progress Notes (Signed)
Daily Session Note  Patient Details  Name: [name] [name] MRN: [id number] Date of Birth: [birth year] Referring Provider:   [name] Pulmonary Rehab Walk Test from 08/15/2021 in [place]  Referring Provider [name], MD       Encounter Date: 09/30/2021  Check In:  Session Check In - 09/30/21 1057       Check-In   Supervising physician immediately available to respond to emergencies [facility] immediately available    Physician(s) Dr. Dwyane Dee    [facility]    Staff Present Rosebud Poles, RN, Quentin Ore, MS, ACSM-CEP, Exercise Physiologist;Lisa Ysidro Evert, RN    Virtual Visit No    Medication changes reported     No    Fall or balance concerns reported    No    Tobacco Cessation No Change    Warm-up and Cool-down Performed as group-led instruction    Resistance Training Performed Yes    VAD Patient? No    PAD/SET Patient? No      Pain Assessment   Currently in Pain? No/denies    Multiple Pain Sites No             Capillary Blood Glucose: No results found for this or any previous visit (from the past 24 hour(s)).   Exercise Prescription Changes - 09/30/21 1100       Response to Exercise   Blood Pressure (Admit) 124/64    Blood Pressure (Exercise) 120/70    Blood Pressure (Exit) 120/60    Heart Rate (Admit) 64 bpm    Heart Rate (Exercise) 85 bpm    Heart Rate (Exit) 69 bpm    Oxygen Saturation (Admit) 96 %    Oxygen Saturation (Exercise) 90 %    Oxygen Saturation (Exit) 95 %    Rating of Perceived Exertion (Exercise) 12    Perceived Dyspnea (Exercise) 2    Duration Progress to 30 minutes of  aerobic without signs/symptoms of physical distress    Intensity THRR unchanged      Progression   Progression Continue to progress workloads to maintain intensity without signs/symptoms of physical distress.      Resistance Training   Training Prescription Yes    Weight Yellow bands    Reps  10-15    Time 10 Minutes      Oxygen   Liters 2   2L on track, RA on NuStep     NuStep   Level 5    SPM 90    Minutes 15    METs 2.5      Track   Laps 11    Minutes 15    METs 2.28             Social History   Tobacco Use  Smoking Status Former   Packs/day: 0.00   Years: 30.00   Pack years: 0.00   Types: Cigarettes   Start date: 12/01/2003   Quit date: 01/06/2013   Years since quitting: 8.7  Smokeless Tobacco Never    Goals Met:  Proper associated with RPD/PD & O2 Sat Independence with exercise equipment Exercise tolerated well No report of concerns or symptoms today Strength training completed today  Goals Unmet:  Not Applicable  Comments: Service time is from 1015 to 1135.    Dr. Rodman Pickle is Medical Director for Pulmonary Rehab at [facility] [place].

## 2021-10-02 ENCOUNTER — Encounter (HOSPITAL_COMMUNITY)
Admission: RE | Admit: 2021-10-02 | Discharge: 2021-10-02 | Disposition: A | Discharge status: Home / Self Care | Payer: PPO | Source: Ambulatory Visit | Attending: Surgery | Admitting: Surgery

## 2021-10-02 ENCOUNTER — Other Ambulatory Visit: Payer: Self-pay

## 2021-10-02 DIAGNOSIS — J438 Other emphysema: Secondary | ICD-10-CM | POA: Diagnosis not present

## 2021-10-02 NOTE — Progress Notes (Signed)
Daily Session Note  Patient Details  Name: [name] [name] MRN: [id number] Date of Birth: [birth year] Referring Provider:   [name] Pulmonary Rehab Walk Test from 08/15/2021 in [place]  Referring Provider [name], MD       Encounter Date: 10/02/2021  Check In:  Session Check In - 10/02/21 1129       Check-In   Supervising physician immediately available to respond to emergencies [facility] immediately available    Physician(s) Dr. Frederic Jericho    [facility]    Staff Present Rosebud Poles, RN, Quentin Ore, MS, ACSM-CEP, Exercise Physiologist    Virtual Visit No    Medication changes reported     No    Fall or balance concerns reported    No    Tobacco Cessation No Change    Warm-up and Cool-down Performed as group-led instruction    Resistance Training Performed Yes    VAD Patient? No    PAD/SET Patient? No      Pain Assessment   Currently in Pain? No/denies    Multiple Pain Sites No             Capillary Blood Glucose: No results found for this or any previous visit (from the past 24 hour(s)).    Social History   Tobacco Use  Smoking Status Former   Packs/day: 0.00   Years: 30.00   Pack years: 0.00   Types: Cigarettes   Start date: 12/01/2003   Quit date: 01/06/2013   Years since quitting: 8.7  Smokeless Tobacco Never    Goals Met:  Proper associated with RPD/PD & O2 Sat Independence with exercise equipment Improved SOB with ADL's Exercise tolerated well No report of concerns or symptoms today Strength training completed today  Goals Unmet:  Not Applicable  Comments: Service time is from 1024 to 1125.    Dr. Rodman Pickle is Medical Director for Pulmonary Rehab at [facility].

## 2021-10-06 ENCOUNTER — Encounter: Payer: Self-pay | Admitting: Pulmonary Disease

## 2021-10-06 ENCOUNTER — Ambulatory Visit: Payer: PPO | Admitting: Pulmonary Disease

## 2021-10-06 ENCOUNTER — Other Ambulatory Visit: Payer: Self-pay

## 2021-10-06 VITALS — BP 136/68 | HR 74 | Temp 98.1°F | Ht 67.5 in | Wt 197.0 lb

## 2021-10-06 DIAGNOSIS — J449 Chronic obstructive pulmonary disease, unspecified: Secondary | ICD-10-CM

## 2021-10-06 NOTE — Patient Instructions (Signed)
Continue the Advair and Spiriva Continue with pulmonary rehab We will schedule PFTs and follow-up in 1 to 2 months.

## 2021-10-06 NOTE — Progress Notes (Signed)
Pulmonary Individual Treatment Plan  Patient Details  Name: [name] [name] MRN: [id number] Date of Birth: June 02, 1956 Referring Provider:   [name] Pulmonary Rehab Walk Test from 08/15/2021 in [facility]  Referring Provider [name], MD       Initial Encounter Date:  Flowsheet Row Pulmonary Rehab Walk Test from 08/15/2021 in [facility]  Date 08/15/21       Visit Diagnosis: Other emphysema ([place])  Patient's Home Medications on Admission:   Current Outpatient Medications:    ADVAIR DISKUS 100-50 MCG/ACT AEPB, Inhale 1 puff into the lungs 2 (two) times daily., Disp: , Rfl:    albuterol (VENTOLIN HFA) 108 (90 Base) MCG/ACT inhaler, Inhale 2 puffs into the lungs every 4 (four) hours as needed for wheezing or shortness of breath., Disp: , Rfl:    cholecalciferol (VITAMIN D) 1000 units tablet, Take 1,000 Units by mouth daily., Disp: , Rfl:    losartan (COZAAR) 25 MG tablet, Take 25 mg by mouth daily., Disp: , Rfl:    Multiple Vitamin (MULTIVITAMIN) tablet, Take 1 tablet by mouth daily., Disp: , Rfl:    omeprazole (PRILOSEC) 20 MG capsule, Take 20 mg by mouth 2 (two) times daily., Disp: , Rfl:    simvastatin (ZOCOR) 40 MG tablet, Take 40 mg by mouth at bedtime., Disp: , Rfl:    SPIRIVA RESPIMAT 2.5 MCG/ACT AERS, Inhale 2.5 mg into the lungs in the morning., Disp: , Rfl:    SYNTHROID 100 MCG tablet, Take 100 mcg by mouth daily., Disp: , Rfl:   Past Medical History: Past Medical History:  Diagnosis Date   Arthritis    right knee, left hip   Carpal tunnel syndrome of left wrist 05/2017   COPD, severe (HCC)    SOB with ADLs per pt. - no O2   Cubital tunnel syndrome on left 05/2017   Dental crowns present    also dental implants   Esophageal reflux    Family history of adverse reaction to anesthesia    pt's mother has hx. of agitation and hallucinations post-op   History of asthma    as a child   Hypothyroidism  (acquired)    Tachycardia    Holter monitor 06/08/2017 - 06/10/2017:  does not have results yet    Tobacco Use: Social History   Tobacco Use  Smoking Status Former   Packs/day: 0.00   Years: 30.00   Pack years: 0.00   Types: Cigarettes   Start date: 12/01/2003   Quit date: 01/06/2013   Years since quitting: 8.7  Smokeless Tobacco Never    Labs: Recent Review Flowsheet Data   There is no flowsheet data to display.     Capillary Blood Glucose: Lab Results  Component Value Date   GLUCAP 107 (H) 06/18/2017     Pulmonary Assessment Scores:  Pulmonary Assessment Scores     Row Name 08/15/21 1208         ADL UCSD   ADL Phase Entry     SOB Score total 48       CAT Score   CAT Score pre 15       mMRC Score   mMRC Score 0             UCSD: Self-administered rating of dyspnea associated with activities of daily living (ADLs) 6-point scale (0 = "not at all" to 5 = "maximal or unable to do because of breathlessness")  Scoring Scores range from  0 to 120.  Minimally important difference is 5 units  CAT: CAT can identify the health impairment of COPD patients and is better correlated with disease progression.  CAT has a scoring range of zero to 40. The CAT score is classified into four groups of low (less than 10), medium (10 - 20), high (21-30) and very high (31-40) based on the impact level of disease on health status. A CAT score over 10 suggests significant symptoms.  A worsening CAT score could be explained by an exacerbation, poor medication adherence, poor inhaler technique, or progression of COPD or comorbid conditions.  CAT MCID is 2 points  mMRC: mMRC (Modified Medical Research Council) Dyspnea Scale is used to assess the degree of baseline functional disability in patients of respiratory disease due to dyspnea. No minimal important difference is established. A decrease in score of 1 point or greater is considered a positive change.   Pulmonary Function  Assessment:   Exercise Target Goals: Exercise Program Goal: Individual exercise prescription set using results from initial 6 min walk test and THRR while considering  patient's activity barriers and safety.   Exercise Prescription Goal: Initial exercise prescription builds to 30-45 minutes a day of aerobic activity, 2-3 days per week.  Home exercise guidelines will be given to patient during program as part of exercise prescription that the participant will acknowledge.  Activity Barriers & Risk Stratification:  Activity Barriers & Cardiac Risk Stratification - 08/15/21 1009       Activity Barriers & Cardiac Risk Stratification   Activity Barriers Other (comment);Arthritis;Deconditioning    Comments Sciatic and left hip arthritis    Cardiac Risk Stratification High             6 Minute Walk:  6 Minute Walk     Row Name 08/15/21 1100         6 Minute Walk   Phase Initial     Distance 1000 feet     Walk Time 6 minutes     # of Rest Breaks 2  Break 1: 2:30 -4:00 and 5:35 - 6:00     MPH 1.9     METS 3.3     RPE 14     Perceived Dyspnea  2     VO2 Peak 11.5     Symptoms Yes (comment)     Comments SOB, RPD = 2     Resting HR 70 bpm     Resting BP 136/80     Resting Oxygen Saturation  97 %     Exercise Oxygen Saturation  during 6 min walk 85 %  See interval oxygen readings     Max Ex. HR 133 bpm     Max Ex. BP 190/90     2 Minute Post BP 162/90  4 - 5 min BP 128/84       Interval HR   1 Minute HR 98     2 Minute HR 115     3 Minute HR 114     4 Minute HR 110     5 Minute HR 124     6 Minute HR 133     2 Minute Post HR 93     Interval Heart Rate? Yes       Interval Oxygen   Interval Oxygen? Yes     1 Minute Oxygen Saturation % 95 %     1 Minute Liters of Oxygen 0 L     2 Minute Oxygen Saturation %  86 %     2 Minute Liters of Oxygen 0 L     3 Minute Oxygen Saturation % 89 %     3 Minute Liters of Oxygen 0 L     4 Minute Oxygen Saturation % 88 %     4  Minute Liters of Oxygen 0 L     5 Minute Oxygen Saturation % 92 %     5 Minute Liters of Oxygen 0 L     6 Minute Oxygen Saturation % 85 %     6 Minute Liters of Oxygen 0 L     2 Minute Post Oxygen Saturation % 96 %     2 Minute Post Liters of Oxygen 0 L              Oxygen Initial Assessment:  Oxygen Initial Assessment - 08/15/21 1207       Intervention   Short Term Goals To learn and demonstrate proper pursed lip breathing techniques or other breathing techniques. ;To learn and understand importance of maintaining oxygen saturations>88%;To learn and demonstrate proper use of respiratory medications;To learn and understand importance of monitoring SPO2 with pulse oximeter and demonstrate accurate use of the pulse oximeter.;To learn and exhibit compliance with exercise, home and travel O2 prescription    Long  Term Goals Exhibits proper breathing techniques, such as pursed lip breathing or other method taught during program session;Maintenance of O2 saturations>88%;Exhibits compliance with exercise, home  and travel O2 prescription;Verbalizes importance of monitoring SPO2 with pulse oximeter and return demonstration             Oxygen Re-Evaluation:  Oxygen Re-Evaluation     Row Name 09/02/21 0800 09/29/21 0824           Program Oxygen Prescription   Program Oxygen Prescription None Continuous      Liters per minute -- 2      Comments -- Is on O2 for track walking        Home Oxygen   Home Oxygen Device None None      Sleep Oxygen Prescription None None      Home Exercise Oxygen Prescription None None      Home Resting Oxygen Prescription None None        Goals/Expected Outcomes   Short Term Goals To learn and demonstrate proper pursed lip breathing techniques or other breathing techniques. ;To learn and understand importance of maintaining oxygen saturations>88%;To learn and demonstrate proper use of respiratory medications;To learn and understand importance of  monitoring SPO2 with pulse oximeter and demonstrate accurate use of the pulse oximeter.;To learn and exhibit compliance with exercise, home and travel O2 prescription To learn and demonstrate proper pursed lip breathing techniques or other breathing techniques. ;To learn and understand importance of maintaining oxygen saturations>88%;To learn and demonstrate proper use of respiratory medications;To learn and understand importance of monitoring SPO2 with pulse oximeter and demonstrate accurate use of the pulse oximeter.;To learn and exhibit compliance with exercise, home and travel O2 prescription      Long  Term Goals Exhibits proper breathing techniques, such as pursed lip breathing or other method taught during program session;Maintenance of O2 saturations>88%;Exhibits compliance with exercise, home  and travel O2 prescription;Verbalizes importance of monitoring SPO2 with pulse oximeter and return demonstration Exhibits proper breathing techniques, such as pursed lip breathing or other method taught during program session;Maintenance of O2 saturations>88%;Exhibits compliance with exercise, home  and travel O2 prescription;Verbalizes importance of monitoring SPO2 with pulse oximeter and return  demonstration      Goals/Expected Outcomes Compliance and understanding of oxygen saturation monitoring and importance of breathing techniques to decrease shortness of breath. Compliance and understanding of oxygen saturation monitoring and importance of breathing techniques to decrease shortness of breath.               Oxygen Discharge (Final Oxygen Re-Evaluation):  Oxygen Re-Evaluation - 09/29/21 0824       Program Oxygen Prescription   Program Oxygen Prescription Continuous    Liters per minute 2    Comments Is on O2 for track walking      Home Oxygen   Home Oxygen Device None    Sleep Oxygen Prescription None    Home Exercise Oxygen Prescription None    Home Resting Oxygen Prescription None       Goals/Expected Outcomes   Short Term Goals To learn and demonstrate proper pursed lip breathing techniques or other breathing techniques. ;To learn and understand importance of maintaining oxygen saturations>88%;To learn and demonstrate proper use of respiratory medications;To learn and understand importance of monitoring SPO2 with pulse oximeter and demonstrate accurate use of the pulse oximeter.;To learn and exhibit compliance with exercise, home and travel O2 prescription    Long  Term Goals Exhibits proper breathing techniques, such as pursed lip breathing or other method taught during program session;Maintenance of O2 saturations>88%;Exhibits compliance with exercise, home  and travel O2 prescription;Verbalizes importance of monitoring SPO2 with pulse oximeter and return demonstration    Goals/Expected Outcomes Compliance and understanding of oxygen saturation monitoring and importance of breathing techniques to decrease shortness of breath.             Initial Exercise Prescription:  Initial Exercise Prescription - 08/15/21 1200       Date of Initial Exercise RX and Referring Provider   Date 08/15/21    Referring Provider [name], MD      NuStep   Level 2    SPM 75    Minutes 15    METs 2      Track   Laps 10    Minutes 15    METs 2.16      Prescription Details   Frequency (times per week) 2    Duration Progress to 30 minutes of continuous aerobic without signs/symptoms of physical distress      Intensity   THRR 40-80% of Max Heartrate 61-122    Ratings of Perceived Exertion 11-13    Perceived Dyspnea 0-4      Progression   Progression Continue progressive overload as per policy without signs/symptoms or physical distress.      Resistance Training   Training Prescription Yes    Weight Yellow bands    Reps 10-15             Perform Capillary Blood Glucose checks as needed.  Exercise Prescription Changes:   Exercise Prescription Changes     Row Name  08/19/21 1200 09/02/21 1200 09/16/21 1100 09/30/21 1100       Response to Exercise   Blood Pressure (Admit) 146/82 130/82 128/70 124/64    Blood Pressure (Exercise) 164/90 154/72 144/74 120/70    Blood Pressure (Exit) 134/80 136/80 116/72 120/60    Heart Rate (Admit) 75 bpm 72 bpm 75 bpm 64 bpm    Heart Rate (Exercise) 85 bpm 105 bpm 113 bpm 85 bpm    Heart Rate (Exit) 83 bpm 94 bpm 85 bpm 69 bpm    Oxygen Saturation (Admit) 96 % 96 %  97 % 96 %    Oxygen Saturation (Exercise) 93 % 96 % 86 % 90 %    Oxygen Saturation (Exit) 96 % -- 93 % 95 %    Rating of Perceived Exertion (Exercise) '11 14 12 12    ' Perceived Dyspnea (Exercise) '2 3 1 2    ' Duration Continue with 30 min of aerobic exercise without signs/symptoms of physical distress. Progress to 30 minutes of  aerobic without signs/symptoms of physical distress Progress to 30 minutes of  aerobic without signs/symptoms of physical distress Progress to 30 minutes of  aerobic without signs/symptoms of physical distress    Intensity Other (comment)  40-80% of HRR THRR unchanged THRR unchanged THRR unchanged      Progression   Progression Continue to progress workloads to maintain intensity without signs/symptoms of physical distress. Continue to progress workloads to maintain intensity without signs/symptoms of physical distress. Continue to progress workloads to maintain intensity without signs/symptoms of physical distress. Continue to progress workloads to maintain intensity without signs/symptoms of physical distress.      Resistance Training   Training Prescription Yes No No Yes    Weight Yellow bands Yellow bands Yellow bands Yellow bands    Reps 10-15 10-15 10-15 10-15    Time 10 Minutes 10 Minutes 10 Minutes 10 Minutes      Oxygen   Liters -- -- 2  placed on 2L O2 when walking on track today, she desaturated to 85-86 % on RA. 2  2L on track, RA on NuStep      NuStep   Level '2 3 4 5    ' SPM 80 80 80 90    Minutes '30 15 15 15    ' METs  1.7 2.2 2.4 2.5      Track   Laps -- 9 11  was able to walk much further and with more ease on 2L of O2 today. 11    Minutes -- '15 15 15    ' METs -- -- -- 2.28             Exercise Comments:   Exercise Comments     Row Name 08/15/21 1205 08/19/21 1509         Exercise Comments Pt with current history of Thoracic aortic aneurysm - BP [place] <180/90 Candence completed her 1st day of exercise. She exercised 30 min on the Nustep at level 2 without complaints. Pt tolerated exercise fair as we are currently waiting for her to see her PCP and she is expecting to get a CT for her aortic aneurysm. She performed the warmup and cooldown standing without limitations. Will monitor and progress as able.               Exercise Goals and Review:   Exercise Goals     Row Name 08/15/21 1011 09/02/21 0754 09/29/21 0818         Exercise Goals   Increase Physical Activity Yes Yes Yes     Intervention Provide advice, education, support and counseling about physical activity/exercise needs.;Develop an individualized exercise prescription for aerobic and resistive training based on initial evaluation findings, risk stratification, comorbidities and participant's personal goals. Provide advice, education, support and counseling about physical activity/exercise needs.;Develop an individualized exercise prescription for aerobic and resistive training based on initial evaluation findings, risk stratification, comorbidities and participant's personal goals. Provide advice, education, support and counseling about physical activity/exercise needs.;Develop an individualized exercise prescription for aerobic and resistive training based on initial evaluation findings, risk  stratification, comorbidities and participant's personal goals.     Expected Outcomes Long Term: Exercising regularly at least 3-5 days a week.;Long Term: Add in home exercise to make exercise part of routine and to increase amount of  physical activity.;Short Term: Attend rehab on a regular basis to increase amount of physical activity. Long Term: Exercising regularly at least 3-5 days a week.;Long Term: Add in home exercise to make exercise part of routine and to increase amount of physical activity.;Short Term: Attend rehab on a regular basis to increase amount of physical activity. Long Term: Exercising regularly at least 3-5 days a week.;Long Term: Add in home exercise to make exercise part of routine and to increase amount of physical activity.;Short Term: Attend rehab on a regular basis to increase amount of physical activity.     Increase Strength and Stamina Yes Yes Yes     Intervention Provide advice, education, support and counseling about physical activity/exercise needs.;Develop an individualized exercise prescription for aerobic and resistive training based on initial evaluation findings, risk stratification, comorbidities and participant's personal goals. Provide advice, education, support and counseling about physical activity/exercise needs.;Develop an individualized exercise prescription for aerobic and resistive training based on initial evaluation findings, risk stratification, comorbidities and participant's personal goals. Provide advice, education, support and counseling about physical activity/exercise needs.;Develop an individualized exercise prescription for aerobic and resistive training based on initial evaluation findings, risk stratification, comorbidities and participant's personal goals.     Expected Outcomes Long Term: Improve cardiorespiratory fitness, muscular endurance and strength as measured by increased METs and functional capacity (6MWT);Short Term: Perform resistance training exercises routinely during rehab and add in resistance training at home;Short Term: Increase workloads from initial exercise prescription for resistance, speed, and METs. Long Term: Improve cardiorespiratory fitness, muscular  endurance and strength as measured by increased METs and functional capacity (6MWT);Short Term: Perform resistance training exercises routinely during rehab and add in resistance training at home;Short Term: Increase workloads from initial exercise prescription for resistance, speed, and METs. Long Term: Improve cardiorespiratory fitness, muscular endurance and strength as measured by increased METs and functional capacity (6MWT);Short Term: Perform resistance training exercises routinely during rehab and add in resistance training at home;Short Term: Increase workloads from initial exercise prescription for resistance, speed, and METs.     Able to understand and use rate of perceived exertion (RPE) scale Yes Yes Yes     Intervention Provide education and explanation on how to use RPE scale Provide education and explanation on how to use RPE scale Provide education and explanation on how to use RPE scale     Expected Outcomes -- -- Short Term: Able to use RPE daily in rehab to express subjective intensity level;Long Term:  Able to use RPE to guide intensity level when exercising independently     Able to understand and use Dyspnea scale Yes Yes Yes     Intervention Provide education and explanation on how to use Dyspnea scale Provide education and explanation on how to use Dyspnea scale Provide education and explanation on how to use Dyspnea scale     Expected Outcomes Short Term: Able to use Dyspnea scale daily in rehab to express subjective sense of shortness of breath during exertion;Long Term: Able to use Dyspnea scale to guide intensity level when exercising independently Short Term: Able to use Dyspnea scale daily in rehab to express subjective sense of shortness of breath during exertion;Long Term: Able to use Dyspnea scale to guide intensity level when exercising independently Short Term: Able to  use Dyspnea scale daily in rehab to express subjective sense of shortness of breath during exertion;Long  Term: Able to use Dyspnea scale to guide intensity level when exercising independently     Knowledge and understanding of Target Heart Rate Range (THRR) Yes Yes Yes     Intervention Provide education and explanation of THRR including how the numbers were predicted and where they are located for reference Provide education and explanation of THRR including how the numbers were predicted and where they are located for reference Provide education and explanation of THRR including how the numbers were predicted and where they are located for reference     Expected Outcomes Short Term: Able to state/look up THRR;Long Term: Able to use THRR to govern intensity when exercising independently;Short Term: Able to use daily as guideline for intensity in rehab Short Term: Able to state/look up THRR;Long Term: Able to use THRR to govern intensity when exercising independently;Short Term: Able to use daily as guideline for intensity in rehab Short Term: Able to state/look up THRR;Long Term: Able to use THRR to govern intensity when exercising independently;Short Term: Able to use daily as guideline for intensity in rehab     Understanding of Exercise Prescription Yes Yes Yes     Intervention Provide education, explanation, and written materials on patient's individual exercise prescription Provide education, explanation, and written materials on patient's individual exercise prescription Provide education, explanation, and written materials on patient's individual exercise prescription     Expected Outcomes Short Term: Able to explain program exercise prescription;Long Term: Able to explain home exercise prescription to exercise independently Short Term: Able to explain program exercise prescription;Long Term: Able to explain home exercise prescription to exercise independently Short Term: Able to explain program exercise prescription;Long Term: Able to explain home exercise prescription to exercise independently               Exercise Goals Re-Evaluation :  Exercise Goals Re-Evaluation     Row Name 09/02/21 0754 09/29/21 0819           Exercise Goal Re-Evaluation   Exercise Goals Review Increase Physical Activity;Able to understand and use Dyspnea scale;Understanding of Exercise Prescription;Increase Strength and Stamina;Knowledge and understanding of Target Heart Rate Range (THRR);Able to understand and use rate of perceived exertion (RPE) scale Increase Physical Activity;Able to understand and use Dyspnea scale;Understanding of Exercise Prescription;Increase Strength and Stamina;Knowledge and understanding of Target Heart Rate Range (THRR);Able to understand and use rate of perceived exertion (RPE) scale      Comments Tanny has completed 4 exercise sessions. She exercises for 15 min on the Nustep and track. She has progressed from exercising only on the Nustep since her BP has stabilized. Bonnye averages 2.2 METs at level 3 on the Nustep and 1.81 METs on the track. She has to stop and take seated rest breaks on the track because she is deconditioned. Alton performs the warmup and cooldown standing without limitations. She is motivated to exercise and improve her functional capacity. Will continue to monitor and progress as able. Nyesha has completed 12 exercise sessions. She exercises for 15 min on the Nustep and track. Sawsan averages 2.4 METs at level 5 on the Nustep and 2.28 METs on the track. Cheyenna has progressed for both exercise modes and tolerates progressions well. She has been using oxygen on the track as this causes her to take less rest breaks. Mirta has mentioned to me that she notices a difference when she is exercising with oxygen on the track.  Hiba performs the warmup and cooldown standing without limitations. She enjoys Pulmonary Rehab as she jokes with me. Will continue to monitor and progress as able.      Expected Outcomes Through exercise at rehab and home, the pt will decrease shortness of  breath with daily activities and feel confident in carrying out an exercise regimen at home. Through exercise at rehab and home, the pt will decrease shortness of breath with daily activities and feel confident in carrying out an exercise regimen at home.               Discharge Exercise Prescription (Final Exercise Prescription Changes):  Exercise Prescription Changes - 09/30/21 1100       Response to Exercise   Blood Pressure (Admit) 124/64    Blood Pressure (Exercise) 120/70    Blood Pressure (Exit) 120/60    Heart Rate (Admit) 64 bpm    Heart Rate (Exercise) 85 bpm    Heart Rate (Exit) 69 bpm    Oxygen Saturation (Admit) 96 %    Oxygen Saturation (Exercise) 90 %    Oxygen Saturation (Exit) 95 %    Rating of Perceived Exertion (Exercise) 12    Perceived Dyspnea (Exercise) 2    Duration Progress to 30 minutes of  aerobic without signs/symptoms of physical distress    Intensity THRR unchanged      Progression   Progression Continue to progress workloads to maintain intensity without signs/symptoms of physical distress.      Resistance Training   Training Prescription Yes    Weight Yellow bands    Reps 10-15    Time 10 Minutes      Oxygen   Liters 2   2L on track, RA on NuStep     NuStep   Level 5    SPM 90    Minutes 15    METs 2.5      Track   Laps 11    Minutes 15    METs 2.28             Nutrition:  Target Goals: Understanding of nutrition guidelines, daily intake of sodium <15104m, cholesterol <2054m calories 30% from fat and 7% or less from saturated fats, daily to have 5 or more servings of fruits and vegetables.  Biometrics:  Pre Biometrics - 08/15/21 1100       Pre Biometrics   Grip Strength 26 kg              Nutrition Therapy Plan and Nutrition Goals:  Nutrition Therapy & Goals - 08/19/21 1134       Nutrition Therapy   Diet General Healthful    Drug/Food Interactions Statins/Certain Fruits      Personal Nutrition Goals    Nutrition Goal Pt to build a healthy plate including vegetables, fruits, whole grains, and low-fat dairy products in a heart healthy meal plan.      Intervention Plan   Intervention Prescribe, educate and counsel regarding individualized specific dietary modifications aiming towards targeted core components such as weight, hypertension, lipid management, diabetes, heart failure and other comorbidities.;Nutrition handout(s) given to patient.    Expected Outcomes Long Term Goal: Adherence to prescribed nutrition plan.;Short Term Goal: Understand basic principles of dietary content, such as calories, fat, sodium, cholesterol and nutrients.             Nutrition Assessments:  MEDIFICTS Score Key: ?70 Need to make dietary changes  40-70 Heart Healthy Diet ? 40 Therapeutic Level Cholesterol Diet  Flowsheet Row PULMONARY REHAB OTHER RESPIRATORY from 08/19/2021 in [place]  Picture Your Plate Total Score on Admission 66      Picture Your Plate Scores: <11 Unhealthy dietary pattern with much room for improvement. 41-50 Dietary pattern unlikely to meet recommendations for good health and room for improvement. 51-60 More healthful dietary pattern, with some room for improvement.  >60 Healthy dietary pattern, although there may be some specific behaviors that could be improved.    Nutrition Goals Re-Evaluation:  Nutrition Goals Re-Evaluation     [place] Name 08/19/21 1134             Goals   Current Weight 196 lb (88.9 kg)                Nutrition Goals Discharge (Final Nutrition Goals Re-Evaluation):  Nutrition Goals Re-Evaluation - 08/19/21 1134       Goals   Current Weight 196 lb (88.9 kg)             Psychosocial: Target Goals: Acknowledge presence or absence of significant depression and/or stress, maximize coping skills, provide positive support system. Participant is able to verbalize types and ability to use techniques and skills  needed for reducing stress and depression.  Initial Review & Psychosocial Screening:  Initial Psych Review & Screening - 08/15/21 1209       Screening Interventions   Expected Outcomes Short Term goal: Identification and review with participant of any Quality of Life or Depression concerns found by scoring the questionnaire.;Long Term goal: The participant improves quality of Life and PHQ9 Scores as seen by post scores and/or verbalization of changes             Quality of Life Scores:  Scores of 19 and below usually indicate a poorer quality of life in these areas.  A difference of  2-3 points is a clinically meaningful difference.  A difference of 2-3 points in the total score of the Quality of Life Index has been associated with significant improvement in overall quality of life, self-image, physical symptoms, and general health in studies assessing change in quality of life.  PHQ-9: Recent Review Flowsheet Data     Depression screen [facility] 2/9 08/15/2021   Decreased Interest 0   Down, Depressed, Hopeless 0   PHQ - 2 Score 0   Altered sleeping 0   Tired, decreased energy 0   Change in appetite 0   Feeling bad or failure about yourself  0   Trouble concentrating 0   Moving slowly or fidgety/restless 0   Suicidal thoughts 0   PHQ-9 Score 0   Difficult doing work/chores Not difficult at all      Interpretation of Total Score  Total Score Depression Severity:  1-4 = Minimal depression, 5-9 = Mild depression, 10-14 = Moderate depression, 15-19 = Moderately severe depression, 20-27 = Severe depression   Psychosocial Evaluation and Intervention:   Psychosocial Re-Evaluation:  Psychosocial Re-Evaluation     [place] Name 09/01/21 1201 09/30/21 1403           Psychosocial Re-Evaluation   Current issues with None Identified None Identified      Comments No barriers or psychosocial concerns identified at this time.  She does not need a AAA aneurysm repair yet.  It is being  monitored yearly by Dr. Cyndia Bent.  She is very happy about this. No psychosocial concerns identified at this time.  Deja laughs, has a positive attitude, and seems to enjoy  the social aspect of exercising in pulmonary rehab.      Expected Outcomes For Cheyanne to continue to have no barriers or psychosocial concerns while participating in pulmonary rehab. That Galadriel continues to have no psychosocial barriers or concerns while participating in pulmonary rehab.      Interventions Encouraged to attend Pulmonary Rehabilitation for the exercise Encouraged to attend Pulmonary Rehabilitation for the exercise      Continue Psychosocial Services  No Follow up required No Follow up required               Psychosocial Discharge (Final Psychosocial Re-Evaluation):  Psychosocial Re-Evaluation - 09/30/21 1403       Psychosocial Re-Evaluation   Current issues with None Identified    Comments No psychosocial concerns identified at this time.  Dameka laughs, has a positive attitude, and seems to enjoy the social aspect of exercising in pulmonary rehab.    Expected Outcomes That Olean continues to have no psychosocial barriers or concerns while participating in pulmonary rehab.    Interventions Encouraged to attend Pulmonary Rehabilitation for the exercise    Continue Psychosocial Services  No Follow up required             Education: Education Goals: Education classes will be provided on a weekly basis, covering required topics. Participant will state understanding/return demonstration of topics presented.  Learning Barriers/Preferences:  Learning Barriers/Preferences - 08/15/21 1020       Learning Barriers/Preferences   Learning Barriers Sight    Learning Preferences Written Material;Computer/Internet;Individual Instruction             Education Topics: Risk Factor Reduction:  -Group instruction that is supported by a PowerPoint presentation. Instructor discusses the definition of a risk  factor, different risk factors for pulmonary disease, and how the heart and lungs work together.     Nutrition for Pulmonary Patient:  -Group instruction provided by PowerPoint slides, verbal discussion, and written materials to support subject matter. The instructor gives an explanation and review of healthy diet recommendations, which includes a discussion on weight management, recommendations for fruit and vegetable consumption, as well as protein, fluid, caffeine, fiber, sodium, sugar, and alcohol. Tips for eating when patients are short of breath are discussed.   Pursed Lip Breathing:  -Group instruction that is supported by demonstration and informational handouts. Instructor discusses the benefits of pursed lip and diaphragmatic breathing and detailed demonstration on how to preform both.   Flowsheet Row PULMONARY REHAB OTHER RESPIRATORY from 08/21/2021 in [place]  Date 08/21/21  Educator Donnetta Simpers  Instruction Review Code 1- Verbalizes Understanding       Oxygen Safety:  -Group instruction provided by PowerPoint, verbal discussion, and written material to support subject matter. There is an overview of "What is Oxygen" and "Why do we need it".  Instructor also reviews how to create a safe environment for oxygen use, the importance of using oxygen as prescribed, and the risks of noncompliance. There is a brief discussion on traveling with oxygen and resources the patient may utilize.   Oxygen Equipment:  -Group instruction provided by [facility] Staff utilizing handouts, written materials, and equipment demonstrations.   Signs and Symptoms:  -Group instruction provided by written material and verbal discussion to support subject matter. Warning signs and symptoms of infection, stroke, and heart attack are reviewed and when to call the physician/911 reinforced. Tips for preventing the spread of infection discussed.   Advanced Directives:  -Group  instruction provided by verbal  instruction and written material to support subject matter. Instructor reviews Advanced Directive laws and proper instruction for filling out document.   Pulmonary Video:  -Group video education that reviews the importance of medication and oxygen compliance, exercise, good nutrition, pulmonary hygiene, and pursed lip and diaphragmatic breathing for the pulmonary patient.   Exercise for the Pulmonary Patient:  -Group instruction that is supported by a PowerPoint presentation. Instructor discusses benefits of exercise, core components of exercise, frequency, duration, and intensity of an exercise routine, importance of utilizing pulse oximetry during exercise, safety while exercising, and options of places to exercise outside of rehab.   Flowsheet Row PULMONARY REHAB OTHER RESPIRATORY from 10/02/2021 in [place]  Date 09/04/21  Educator handout       Pulmonary Medications:  -Verbally interactive group education provided by instructor with focus on inhaled medications and proper administration.   Anatomy and Physiology of the Respiratory System and Intimacy:  -Group instruction provided by PowerPoint, verbal discussion, and written material to support subject matter. Instructor reviews respiratory cycle and anatomical components of the respiratory system and their functions. Instructor also reviews differences in obstructive and restrictive respiratory diseases with examples of each. Intimacy, Sex, and Sexuality differences are reviewed with a discussion on how relationships can change when diagnosed with pulmonary disease. Common sexual concerns are reviewed. Flowsheet Row PULMONARY REHAB OTHER RESPIRATORY from 10/02/2021 in [place]  Date 10/02/21  Educator Donnetta Simpers  Instruction Review Code 1- Rosezena Sensor Understanding       MD DAY -A group question and answer session with a medical doctor  that allows participants to ask questions that relate to their pulmonary disease state.   OTHER EDUCATION -Group or individual verbal, written, or video instructions that support the educational goals of the pulmonary rehab program. [place] from 10/02/2021 in [place]  Date 09/11/21  Educator handout  [Met levels]  Instruction Review Code 1- Verbalizes Understanding       Holiday Eating Survival Tips:  -Group instruction provided by PowerPoint slides, verbal discussion, and written materials to support subject matter. The instructor gives patients tips, tricks, and techniques to help them not only survive but enjoy the holidays despite the onslaught of food that accompanies the holidays.   Knowledge Questionnaire Score:  Knowledge Questionnaire Score - 08/15/21 1027       Knowledge Questionnaire Score   Pre Score 17/18 (P)              Core Components/Risk Factors/Patient Goals at Admission:  Personal Goals and Risk Factors at Admission - 08/15/21 1209       Core Components/Risk Factors/Patient Goals on Admission   Intervention Provide education and demonstration as needed of appropriate use of medications, inhalers, and oxygen therapy.    Expected Outcomes Long Term: Maintain appropriate use of medications, inhalers, and oxygen therapy.;Short Term: Achieves understanding of medications use. Understands that oxygen is a medication prescribed by physician. Demonstrates appropriate use of inhaler and oxygen therapy.    Expected Outcomes Long Term: Maintenance of blood pressure at goal levels.;Short Term: Continued assessment and intervention until BP is < 140/35m HG in hypertensive participants. < 130/874mHG in hypertensive participants with diabetes, heart failure or chronic kidney disease.             Core Components/Risk Factors/Patient Goals Review:   Goals and Risk Factor Review     Row Name  09/01/21 1204 09/30/21 1405  Core Components/Risk Factors/Patient Goals Review   Personal Goals Review Increase knowledge of respiratory medications and ability to use respiratory devices properly.;Improve shortness of breath with ADL's;Develop more efficient breathing techniques such as purse lipped breathing and diaphragmatic breathing and practicing self-pacing with activity. Increase knowledge of respiratory medications and ability to use respiratory devices properly.;Improve shortness of breath with ADL's;Develop more efficient breathing techniques such as purse lipped breathing and diaphragmatic breathing and practicing self-pacing with activity.      Review Malavika has attended 2 weeks of pulmonary rehab and her blood pressure has improved since starting the program.  She has been subscribed a second BP lowering medication which has helped. Corley is requiring 2L/min of supplemental oxygen while walking on the track for 15 minutes.  She does not need it when exercising on seated exercise equipment.  She has become independent when setting the equipment she uses in class.  She does not want to use oxygen at home exercising until she is evaluated by a pulmonologist, who she has been referred.  She is progressing in her exercise.      Expected Outcomes See admission goals. See admission goals.               Core Components/Risk Factors/Patient Goals at Discharge (Final Review):   Goals and Risk Factor Review - 09/30/21 1405       Core Components/Risk Factors/Patient Goals Review   Personal Goals Review Increase knowledge of respiratory medications and ability to use respiratory devices properly.;Improve shortness of breath with ADL's;Develop more efficient breathing techniques such as purse lipped breathing and diaphragmatic breathing and practicing self-pacing with activity.    Review Kieana is requiring 2L/min of supplemental oxygen while walking on the track for 15 minutes.  She  does not need it when exercising on seated exercise equipment.  She has become independent when setting the equipment she uses in class.  She does not want to use oxygen at home exercising until she is evaluated by a pulmonologist, who she has been referred.  She is progressing in her exercise.    Expected Outcomes See admission goals.             ITP Comments:   Comments: ITP REVIEW Pt is making expected progress toward pulmonary rehab goals after completing 14 sessions. Recommend continued exercise, life style modification, education, and utilization of breathing techniques to increase stamina and strength and decrease shortness of breath with exertion.

## 2021-10-06 NOTE — Progress Notes (Signed)
[name] [name]    [id number]    [birth year]  Primary Care Physician:Stoneking, Christiane Ha, MD  Referring Physician: Lajean Manes, MD [address] [place],  [place] [address]  Chief complaint:  Consult for COPD  HPI: 65 year old with history of COPD.  Previously followed by Dr. Melvyn Novas.  Has not been seen PCCM since 2013 Maintained on Spiriva.  Advair was started in June 2021 Complains of chronic dyspnea on exertion.  Occasional cough, no fevers or chills Other medical issues include allergies with postnasal drip, GERD for which she is on Prilosec twice daily.  She has occasional hoarseness of voice Evaluated by Dr. Valetta Mole ENT in April 2021 with laryngoscope showing vocal cord edema consistent with LPR  History notable for thoracic aortic aneurysm which was noted during follow-up for lung nodules.  The aneurysm is slowly increasing in size.  She has been evaluated by cardiothoracic surgery with continued surveillance recommended.  Lung nodules have subsequently resolved on follow-up CT scans  She has been referred to pulmonary rehab and is completing that at [facility] Notes desats to 85% on exertion and has been getting supplemental oxygen  Pets: No pets Occupation: Retired Customer service manager Exposures: No mold, hot tub, Customer service manager.  No feather pillows or comforter Smoking history: 30-pack-year smoker.  Quit in 2007 Travel history: No significant travel history Relevant family history: No family history of lung disease   Outpatient Encounter Medications as of 10/06/2021  Medication Sig   ADVAIR DISKUS 100-50 MCG/ACT AEPB Inhale 1 puff into the lungs 2 (two) times daily.   albuterol (VENTOLIN HFA) 108 (90 Base) MCG/ACT inhaler Inhale 2 puffs into the lungs every 4 (four) hours as needed for wheezing or shortness of breath.   cholecalciferol (VITAMIN D) 1000 units tablet Take 1,000 Units by mouth daily.   losartan (COZAAR) 25 MG tablet Take 25 mg by mouth daily.   Multiple Vitamin  (MULTIVITAMIN) tablet Take 1 tablet by mouth daily.   omeprazole (PRILOSEC) 20 MG capsule Take 20 mg by mouth 2 (two) times daily.   simvastatin (ZOCOR) 40 MG tablet Take 40 mg by mouth at bedtime.   SPIRIVA RESPIMAT 2.5 MCG/ACT AERS Inhale 2.5 mg into the lungs in the morning.   SYNTHROID 100 MCG tablet Take 100 mcg by mouth daily.   No facility-administered encounter medications on file as of 10/06/2021.    Allergies as of 10/06/2021 - Review Complete 10/06/2021  Allergen Reaction Noted   Peanut-containing drug products Shortness Of Breath 06/10/2017   Other Other (See Comments) 03/20/2021   Codeine Other (See Comments) 09/15/2012    Past Medical History:  Diagnosis Date   Arthritis    right knee, left hip   Carpal tunnel syndrome of left wrist 05/2017   COPD, severe (HCC)    SOB with ADLs per pt. - no O2   Cubital tunnel syndrome on left 05/2017   Dental crowns present    also dental implants   Esophageal reflux    Family history of adverse reaction to anesthesia    pt's mother has hx. of agitation and hallucinations post-op   History of asthma    as a child   Hypothyroidism (acquired)    Tachycardia    Holter monitor 06/08/2017 - 06/10/2017:  does not have results yet    Past Surgical History:  Procedure Laterality Date   BREAST LUMPECTOMY Left    CARPAL TUNNEL RELEASE Left 06/17/2017   Procedure: CARPAL TUNNEL RELEASE;  Surgeon: Daryll Brod, MD;  Location: [place];  Service: Orthopedics;  Laterality: Left;   CATARACT EXTRACTION W/ INTRAOCULAR LENS IMPLANT Left    MASS EXCISION Left 03/26/2005   thumb   MULTIPLE TOOTH EXTRACTIONS     THYROIDECTOMY, PARTIAL     TONSILLECTOMY     ULNAR NERVE TRANSPOSITION Left 06/17/2017   Procedure: DECOMPRESSION  ULNAR NERVE LEFT ELBOW;  Surgeon: Daryll Brod, MD;  Location: [place];  Service: Orthopedics;  Laterality: Left;    Family History  Problem Relation Age of Onset   Colon cancer Mother     Breast cancer Maternal Aunt    Allergies Brother     Social History   Socioeconomic History   Marital status: Married    Spouse name: Not on file   Number of children: Not on file   Years of education: Not on file   Highest education level: Not on file  Occupational History   Occupation: Government social research officer for [employer]    Employer: [employer]  Tobacco Use   Smoking status: Former    Packs/day: 0.00    Years: 30.00    Pack years: 0.00    Types: Cigarettes    Start date: 12/01/2003    Quit date: 01/06/2013    Years since quitting: 8.7   Smokeless tobacco: Never  Vaping Use   Vaping Use: Never used  Substance and Sexual Activity   Alcohol use: Yes    Comment: 2 beers per week   Drug use: No   Sexual activity: Yes  Other Topics Concern   Not on file  Social History Narrative   Not on file   Social Determinants of Health   Financial Resource Strain: Not on file  Food Insecurity: Not on file  Transportation Needs: Not on file  Physical Activity: Not on file  Stress: Not on file  Social Connections: Not on file  Intimate Partner Violence: Not on file    Review of systems: Review of Systems  Constitutional: Negative for fever and chills.  HENT: Negative.   Eyes: Negative for blurred vision.  Respiratory: as per HPI  Cardiovascular: Negative for chest pain and palpitations.  Gastrointestinal: Negative for vomiting, diarrhea, blood per rectum. Genitourinary: Negative for dysuria, urgency, frequency and hematuria.  Musculoskeletal: Negative for myalgias, back pain and joint pain.  Skin: Negative for itching and rash.  Neurological: Negative for dizziness, tremors, focal weakness, seizures and loss of consciousness.  Endo/Heme/Allergies: Negative for environmental allergies.  Psychiatric/Behavioral: Negative for depression, suicidal ideas and hallucinations.  All other systems reviewed and are negative.  Physical Exam: Blood pressure 136/68, pulse 74, temperature  98.1 F (36.7 C), temperature source Oral, height 5' 7.5" (1.715 m), weight 197 lb (89.4 kg), SpO2 98 %. Gen:      No acute distress HEENT:  EOMI, sclera anicteric Neck:     No masses; no thyromegaly Lungs:    Clear to auscultation bilaterally; normal respiratory effort CV:         Regular rate and rhythm; no murmurs Abd:      + bowel sounds; soft, non-tender; no palpable masses, no distension Ext:    No edema; adequate peripheral perfusion Skin:      Warm and dry; no rash Neuro: alert and oriented x 3 Psych: normal mood and affect  Data Reviewed: Imaging: CT chest 08/21/2021-stable thoracic aortic aneurysm measuring 4.7 cm, mild coronary artery calcification, severe emphysema.  I have reviewed the images personally.  PFTs:  08/16/2012 FVC 3.71 [96%], FEV1 1.76 [50%], F/F 48, TLC 7.09 [125%], DLCO 17.75 [59%] Severe obstruction with hyperinflation, air trapping.  Moderate diffusion impairment  Labs: CBC 05/20/2021-WBC 8.5, eos 7%, absolute eosinophil count 595  Assessment:  Severe COPD Here to reestablish care She may have asthmatic component of COPD given elevated peripheral eosinophils in the past She had recent labs at primary care and will get these for review  Repeat PFTs Continue Advair, Spiriva.  Transition to Trelegy once she starts on her new insurance in 2023 She is finishing pulmonary rehab  Mild desats noted to 85% on exertion at pulmonary rehab.  If she recovers spontaneously then we can avoid long-term oxygen [employer] Med. 2016 Oct 27;[id number].)  Besides she would like to avoid starting on oxygen right away and wants to hold off for now.  Hoarseness of voice She has intermittent hoarseness of voice due to postnasal drip, GERD Reminded to rinse mouth after every use Continue Prilosec twice daily Consider steroid nasal spray and Singulair if her symptoms continue  Plan/Recommendations: Continue current and inhalers Pulmonary rehab PFTs  Marshell Garfinkel  MD [facility] 10/06/2021, 9:30 AM  CC: Lajean Manes, MD

## 2021-10-07 ENCOUNTER — Encounter (HOSPITAL_COMMUNITY)
Admission: RE | Admit: 2021-10-07 | Discharge: 2021-10-07 | Disposition: A | Discharge status: Home / Self Care | Payer: PPO | Source: Ambulatory Visit | Attending: Surgery | Admitting: Surgery

## 2021-10-07 DIAGNOSIS — J438 Other emphysema: Secondary | ICD-10-CM | POA: Diagnosis not present

## 2021-10-07 NOTE — Progress Notes (Signed)
Daily Session Note  Patient Details  Name: [name] [name] MRN: [id number] Date of Birth: 11-27-1956 Referring Provider:   [name] Pulmonary Rehab Walk Test from 08/15/2021 in [place]  Referring Provider [name], MD       Encounter Date: 10/07/2021  Check In:  Session Check In - 10/07/21 1118       Check-In   Supervising physician immediately available to respond to emergencies [facility] immediately available    Physician(s) Dr. Pietro Cassis    Location [facility]    Staff Present Rosebud Poles, RN, Quentin Ore, MS, ACSM-CEP, Exercise Physiologist;Lisa Ysidro Evert, RN    Virtual Visit No    Medication changes reported     No    Fall or balance concerns reported    No    Tobacco Cessation No Change    Warm-up and Cool-down Performed as group-led instruction    Resistance Training Performed Yes    VAD Patient? No    PAD/SET Patient? No      Pain Assessment   Currently in Pain? No/denies    Multiple Pain Sites No             Capillary Blood Glucose: No results found for this or any previous visit (from the past 24 hour(s)).    Social History   Tobacco Use  Smoking Status Former   Packs/day: 0.00   Years: 30.00   Pack years: 0.00   Types: Cigarettes   Start date: 12/01/2003   Quit date: 01/06/2013   Years since quitting: 8.7  Smokeless Tobacco Never    Goals Met:  Proper associated with RPD/PD & O2 Sat Independence with exercise equipment Exercise tolerated well No report of concerns or symptoms today Strength training completed today  Goals Unmet:  Not Applicable  Comments: Service time is from 1017 to 1129.    Dr. Rodman Pickle is Medical Director for Pulmonary Rehab at [facility].

## 2021-10-09 ENCOUNTER — Other Ambulatory Visit: Payer: Self-pay

## 2021-10-09 ENCOUNTER — Encounter (HOSPITAL_COMMUNITY)
Admission: RE | Admit: 2021-10-09 | Discharge: 2021-10-09 | Disposition: A | Discharge status: Home / Self Care | Payer: PPO | Source: Ambulatory Visit | Attending: Surgery | Admitting: Surgery

## 2021-10-09 DIAGNOSIS — J438 Other emphysema: Secondary | ICD-10-CM | POA: Diagnosis not present

## 2021-10-09 NOTE — Progress Notes (Signed)
Daily Session Note  Patient Details  Name: WYNELL HALBERG MRN: [id number] Date of Birth: [birth year] Referring Provider:   [name] Pulmonary Rehab Walk Test from 08/15/2021 in [place]  Referring Provider [name], MD       Encounter Date: 10/09/2021  Check In:  Session Check In - 10/09/21 1121       Check-In   Supervising physician immediately available to respond to emergencies [facility] immediately available    Physician(s) Dr. Broadus John    Staff Present Rosebud Poles, RN, Quentin Ore, MS, ACSM-CEP, Exercise Physiologist;Lisa Ysidro Evert, RN    Virtual Visit No    Medication changes reported     No    Fall or balance concerns reported    No    Tobacco Cessation No Change    Warm-up and Cool-down Performed as group-led instruction    Resistance Training Performed Yes    VAD Patient? No    PAD/SET Patient? No      Pain Assessment   Currently in Pain? No/denies    Multiple Pain Sites No             Capillary Blood Glucose: No results found for this or any previous visit (from the past 24 hour(s)).    Social History   Tobacco Use  Smoking Status Former   Packs/day: 0.00   Years: 30.00   Pack years: 0.00   Types: Cigarettes   Start date: 12/01/2003   Quit date: 01/06/2013   Years since quitting: 8.7  Smokeless Tobacco Never    Goals Met:  Proper associated with RPD/PD & O2 Sat Independence with exercise equipment Exercise tolerated well No report of concerns or symptoms today Strength training completed today  Goals Unmet:  Not Applicable  Comments: Service time is from 1017 to 1132.    Dr. Rodman Pickle is Medical Director for Pulmonary Rehab at [facility].

## 2021-10-10 NOTE — Progress Notes (Signed)
Cardiology Office Note:    Date:  10/13/2021   ID:  [name] [name], DOB [birth year], MRN [id number]  PCP:  Lajean Manes, MD  Cardiologist:  Early Osmond, MD  Electrophysiologist:  None   Referring MD: Elgie Collard, PA-C   Chief Complaint/Reason for Referral: Ascending thoracic aortic aneurysm  History of Present Illness:    PROBLEM LIST: 1.  Thoracic aortic ascending aneurysm around 4.6 to 4.7 cm upon echocardiography and CT scanning; every 6 month surveillance required 2.  Multivessel coronary artery calcifications 3.  COPD 4.  Hyperlipidemia 6.  Hypertension   [name] [name] is a 64 y.o. female with the indicated history here for recommendations regarding thoracic ascending aortic aneurysm.  She is recently seen by the cardiothoracic surgery division.  She is doing well on her aneurysm size was stable both on serial CT measurements and by recent echocardiogram.  She participates in pulmonary rehabilitation.  She has had no worrisome signs or symptoms.  In particular she denies any exertional angina, presyncope, syncope, atypical chest pain, hoarseness, signs or symptoms of stroke, paroxysmal nocturnal dyspnea, or severe bleeding.  She has some mild dependent edema worse in her right lower extremity.  She does get dyspneic during pulmonary rehabilitation and require supplemental oxygen at times.  He is otherwise well without significant complaints.  Past Medical History:  Diagnosis Date   Arthritis    right knee, left hip   Carpal tunnel syndrome of left wrist 05/2017   COPD, severe (HCC)    SOB with ADLs per pt. - no O2   Cubital tunnel syndrome on left 05/2017   Dental crowns present    also dental implants   Esophageal reflux    Family history of adverse reaction to anesthesia    pt's mother has hx. of agitation and hallucinations post-op   History of asthma    as a child   Hypothyroidism (acquired)    Tachycardia    Holter monitor 06/08/2017 - 06/10/2017:  does  not have results yet    Past Surgical History:  Procedure Laterality Date   BREAST LUMPECTOMY Left    CARPAL TUNNEL RELEASE Left 06/17/2017   Procedure: CARPAL TUNNEL RELEASE;  Surgeon: Daryll Brod, MD;  Location: [place];  Service: Orthopedics;  Laterality: Left;   CATARACT EXTRACTION W/ INTRAOCULAR LENS IMPLANT Left    MASS EXCISION Left 03/26/2005   thumb   MULTIPLE TOOTH EXTRACTIONS     THYROIDECTOMY, PARTIAL     TONSILLECTOMY     ULNAR NERVE TRANSPOSITION Left 06/17/2017   Procedure: DECOMPRESSION  ULNAR NERVE LEFT ELBOW;  Surgeon: Daryll Brod, MD;  Location: [place];  Service: Orthopedics;  Laterality: Left;    Current Medications: Current Meds  Medication Sig   ADVAIR DISKUS 100-50 MCG/ACT AEPB Inhale 1 puff into the lungs 2 (two) times daily.   albuterol (VENTOLIN HFA) 108 (90 Base) MCG/ACT inhaler Inhale 2 puffs into the lungs every 4 (four) hours as needed for wheezing or shortness of breath.   amLODipine (NORVASC) 2.5 MG tablet Take 2.5 mg by mouth daily.   aspirin EC 81 MG tablet Take 81 mg by mouth daily. Swallow whole.   cholecalciferol (VITAMIN D) 1000 units tablet Take 1,000 Units by mouth daily.   losartan (COZAAR) 25 MG tablet Take 25 mg by mouth daily.   Multiple Vitamin (MULTIVITAMIN) tablet Take 1 tablet by mouth daily.   omeprazole (PRILOSEC) 20 MG capsule Take 20 mg by mouth 2 (  two) times daily.   simvastatin (ZOCOR) 40 MG tablet Take 40 mg by mouth at bedtime.   SPIRIVA RESPIMAT 2.5 MCG/ACT AERS Inhale 2.5 mg into the lungs in the morning.   SYNTHROID 100 MCG tablet Take 100 mcg by mouth daily.     Allergies:    Allergies  Allergen Reactions   Peanut-Containing Drug Products Shortness Of Breath    [place] NUTS   Other Other (See Comments)   Codeine Other (See Comments)    UNKNOWN    Social History   Tobacco Use   Smoking status: Former    Packs/day: 0.00    Years: 30.00    Pack years: 0.00    Types: Cigarettes     Start date: 12/01/2003    Quit date: 01/06/2013    Years since quitting: 8.7   Smokeless tobacco: Never  Vaping Use   Vaping Use: Never used  Substance Use Topics   Alcohol use: Yes    Comment: 2 beers per week   Drug use: No     Family History: Family History  Problem Relation Age of Onset   Colon cancer Mother    Breast cancer Maternal Aunt    Allergies Brother      ROS:   Please see the history of present illness.    All other systems reviewed and are negative.  EKGs/Labs/Other Studies Reviewed:    The following studies were reviewed today:  EKG:    EKG from June 2022 demonstrates sinus rhythm with PACs and nonspecific ST and T wave changes.  Imaging studies that I have independently reviewed today:   TTE 08/2021 1. Left ventricular ejection fraction, by estimation, is 60 to 65%. The  left ventricle has normal function. The left ventricle has no regional  wall motion abnormalities. Left ventricular diastolic parameters are  consistent with Grade I diastolic  dysfunction (impaired relaxation).   2. Right ventricular systolic function is normal. The right ventricular  size is normal.   3. The mitral valve is normal in structure. No evidence of mitral valve  regurgitation. No evidence of mitral stenosis.   4. The aortic valve is normal in structure. There is mild calcification  of the aortic valve. Aortic valve regurgitation is trivial. No aortic  stenosis is present.   5. Aortic dilatation noted. There is moderate dilatation of the ascending  aorta, measuring 45 mm.   6. The inferior vena cava is normal in size with greater than 50%  respiratory variability, suggesting right atrial pressure of 3 mmHg.  CT w/o contrast 4/22 IMPRESSION: 1. Persistent thoracic aortic aneurysm currently measuring approximately 4.7 cm in diameter (previously measuring approximately 4.6 cm). Ascending thoracic aortic aneurysm. Recommend semi-annual imaging followup by CTA or MRA  and referral to cardiothoracic surgery if not already obtained. This recommendation follows 2010 ACCF/AHA/AATS/ACR/ASA/SCA/SCAI/SIR/STS/SVM Guidelines for the Diagnosis and Management of Patients With Thoracic Aortic Disease. Circulation. 2010; 121: L798-X211. Aortic aneurysm NOS (ICD10-I71.9) 2. Coronary artery disease. 3. Severe emphysematous changes without evidence for concerning pulmonary nodule.  Recent Labs: 05/20/2021: B Natriuretic Peptide 77.9; BUN 11; Creatinine, Ser 0.74; Hemoglobin 12.6; Platelets 316; Potassium 3.5; Sodium 137  Recent Lipid Panel No results found for: CHOL, TRIG, HDL, CHOLHDL, VLDL, LDLCALC, LDLDIRECT  Physical Exam:    VS:  BP 140/82   Pulse 81   Ht 5' 7.5" (1.715 m)   Wt 190 lb (86.2 kg) Comment: pt reported, refused to weigh  SpO2 96%   BMI 29.32 kg/m  Wt Readings from Last 5 Encounters:  10/13/21 190 lb (86.2 kg)  10/06/21 197 lb (89.4 kg)  09/16/21 197 lb 1.5 oz (89.4 kg)  09/02/21 197 lb 8.5 oz (89.6 kg)  08/21/21 199 lb (90.3 kg)    GENERAL:  No apparent distress, AOx3 HEENT:  No carotid bruits, +2 carotid impulses, no scleral icterus CAR: RRR no murmurs, gallops, rubs, or thrills RES:  Clear to auscultation bilaterally ABD:  Soft, nontender, nondistended, positive bowel sounds x 4 VASC:  Absent R radial pulse, +2 left radial pulse, +2 carotid pulses, palpable pedal pulses NEURO:  CN 2-12 grossly intact; motor and sensory grossly intact PSYCH:  No active depression or anxiety EXT:  No edema, ecchymosis, or cyanosis  ASSESSMENT:    1. Aneurysm of ascending aorta without rupture   2. Coronary artery calcification   3. Hyperlipidemia, unspecified hyperlipidemia type   4. Hypertension, unspecified type   5. Chronic obstructive pulmonary disease, unspecified COPD type ([place])    PLAN:    Aneurysm of ascending aorta without rupture The patient is echocardiogram agrees with her last CT scan demonstrating an ascending thoracic aortic  aneurysm of around 4.6 to 4.7 cm.  She has no history of connective tissue disease or familial aortopathy.  She needs imaging either by echocardiography or CT scanning every 6 months for surveillance.  Follow up in 6 months and will order CT for that time.  Coronary artery calcification Continue ASA, statin, but no BB due to COPD.  CT showed multivessel coronary calcification but no signs or symptoms of angina.  She is in pulmonary rehab.  When she reaches the threshold of 5.5 cm for elective aneurysm repair she will need a preprocedural cardiac catheterization.   Hyperlipidemia, unspecified hyperlipidemia type Followed by PCP, goal LDL is < 70.  Hypertension, unspecified type BP mildly above goal but at home, her readings are well controlled.  Continue current regimen.  If needed we can increase her losartan.  She is not on a beta-blocker due to pulmonary disease.  Chronic obstructive pulmonary disease, unspecified COPD type ([place]) She participates in pulmonary rehab and she is being followed by the pulmonary division.  Shared Decision Making/Informed Consent:       Medication Adjustments/Labs and Tests Ordered: Current medicines are reviewed at length with the patient today.  Concerns regarding medicines are outlined above.   Orders Placed This Encounter  Procedures   CT ANGIO CHEST AORTA W/CM & OR WO/CM     No orders of the defined types were placed in this encounter.   Patient Instructions  Medication Instructions:  No changes *If you need a refill on your cardiac medications before your next appointment, please call your pharmacy*   Lab Work: Bmet - in May prior to next CT scan  If you have labs (blood work) drawn today and your tests are completely normal, you will receive your results only by: [place] (if you have MyChart) OR A paper copy in the mail If you have any lab test that is abnormal or we need to change your treatment, we will call you to review the  results.   Testing/Procedures: DUE IN MAY 2023, BEFORE NEXT VISIT WITH DR. Ali Lowe Non-Cardiac CT Angiography (CTA), is a special type of CT scan that uses a computer to produce multi-dimensional views of major blood vessels throughout the body. In CT angiography, a contrast material is injected through an IV to help visualize the blood vessels    Follow-Up: At [facility],  you and your health needs are our priority.  As part of our continuing mission to provide you with exceptional heart care, we have created designated Provider Care Teams.  These Care Teams include your primary Cardiologist (physician) and Advanced Practice Providers (APPs -  Physician Assistants and Nurse Practitioners) who all work together to provide you with the care you need, when you need it.  Your next appointment:   6 month(s)  The format for your next appointment:   In Person  Provider:   Early Osmond, MD     Other Instructions

## 2021-10-13 ENCOUNTER — Ambulatory Visit: Payer: PPO | Admitting: Internal Medicine

## 2021-10-13 ENCOUNTER — Encounter: Payer: Self-pay | Admitting: Internal Medicine

## 2021-10-13 ENCOUNTER — Other Ambulatory Visit: Payer: Self-pay

## 2021-10-13 VITALS — BP 140/82 | HR 81 | Ht 67.5 in | Wt 190.0 lb

## 2021-10-13 DIAGNOSIS — I10 Essential (primary) hypertension: Secondary | ICD-10-CM | POA: Diagnosis not present

## 2021-10-13 DIAGNOSIS — I2510 Atherosclerotic heart disease of native coronary artery without angina pectoris: Secondary | ICD-10-CM

## 2021-10-13 DIAGNOSIS — J449 Chronic obstructive pulmonary disease, unspecified: Secondary | ICD-10-CM

## 2021-10-13 DIAGNOSIS — I2584 Coronary atherosclerosis due to calcified coronary lesion: Secondary | ICD-10-CM | POA: Diagnosis not present

## 2021-10-13 DIAGNOSIS — E785 Hyperlipidemia, unspecified: Secondary | ICD-10-CM

## 2021-10-13 DIAGNOSIS — I7121 Aneurysm of the ascending aorta, without rupture: Secondary | ICD-10-CM

## 2021-10-13 NOTE — Patient Instructions (Addendum)
Medication Instructions:  No changes *If you need a refill on your cardiac medications before your next appointment, please call your pharmacy*   Lab Work: Bmet - in May prior to next CT scan  If you have labs (blood work) drawn today and your tests are completely normal, you will receive your results only by: [place] (if you have MyChart) OR A paper copy in the mail If you have any lab test that is abnormal or we need to change your treatment, we will call you to review the results.   Testing/Procedures: DUE IN MAY 2023, BEFORE NEXT VISIT WITH DR. Ali Lowe  Non-Cardiac CT Angiography (CTA), is a special type of CT scan that uses a computer to produce multi-dimensional views of major blood vessels throughout the body. In CT angiography, a contrast material is injected through an IV to help visualize the blood vessels    Follow-Up: At [facility], you and your health needs are our priority.  As part of our continuing mission to provide you with exceptional heart care, we have created designated Provider Care Teams.  These Care Teams include your primary Cardiologist (physician) and Advanced Practice Providers (APPs -  Physician Assistants and Nurse Practitioners) who all work together to provide you with the care you need, when you need it.  Your next appointment:   6 month(s)  The format for your next appointment:   In Person  Provider:   Early Osmond, MD     Other Instructions

## 2021-10-14 ENCOUNTER — Encounter (HOSPITAL_COMMUNITY)
Admission: RE | Admit: 2021-10-14 | Discharge: 2021-10-14 | Disposition: A | Discharge status: Home / Self Care | Payer: PPO | Source: Ambulatory Visit | Attending: Surgery | Admitting: Surgery

## 2021-10-14 VITALS — Wt 196.9 lb

## 2021-10-14 DIAGNOSIS — J438 Other emphysema: Secondary | ICD-10-CM

## 2021-10-14 NOTE — Progress Notes (Signed)
Daily Session Note  Patient Details  Name: [name] [name] MRN: [id number] Date of Birth: [birth year] Referring Provider:   [name] Pulmonary Rehab Walk Test from 08/15/2021 in [place]  Referring Provider [name], MD       Encounter Date: 10/14/2021  Check In:  Session Check In - 10/14/21 1124       Check-In   Supervising physician immediately available to respond to emergencies [facility] immediately available    Physician(s) Dr. Broadus John    Staff Present Rosebud Poles, RN, Quentin Ore, MS, ACSM-CEP, Exercise Physiologist;Nastasha Reising Ysidro Evert, RN    Virtual Visit No    Medication changes reported     No    Fall or balance concerns reported    No    Tobacco Cessation No Change    Warm-up and Cool-down Performed as group-led instruction    Resistance Training Performed Yes    VAD Patient? No    PAD/SET Patient? No      Pain Assessment   Currently in Pain? No/denies             Capillary Blood Glucose: No results found for this or any previous visit (from the past 24 hour(s)).   Exercise Prescription Changes - 10/14/21 1100       Response to Exercise   Blood Pressure (Admit) 120/74    Blood Pressure (Exercise) 130/80    Blood Pressure (Exit) 124/60    Heart Rate (Admit) 64 bpm    Heart Rate (Exercise) 110 bpm    Heart Rate (Exit) 60 bpm    Oxygen Saturation (Admit) 97 %    Oxygen Saturation (Exercise) 90 %    Oxygen Saturation (Exit) 94 %    Rating of Perceived Exertion (Exercise) 11    Perceived Dyspnea (Exercise) 2    Duration Continue with 30 min of aerobic exercise without signs/symptoms of physical distress.    Intensity THRR unchanged      Progression   Progression Continue to progress workloads to maintain intensity without signs/symptoms of physical distress.      Resistance Training   Training Prescription Yes    Weight Blue bands    Reps 10-15    Time 10 Minutes      Oxygen   Liters 2       NuStep   Level 6    SPM 90    Minutes 15    METs 2.8      Track   Laps 12    Minutes 15             Social History   Tobacco Use  Smoking Status Former   Packs/day: 0.00   Years: 30.00   Pack years: 0.00   Types: Cigarettes   Start date: 12/01/2003   Quit date: 01/06/2013   Years since quitting: 8.7  Smokeless Tobacco Never    Goals Met:  Exercise tolerated well No report of concerns or symptoms today Strength training completed today  Goals Unmet:  Not Applicable  Comments: Service time is from 1021 to [place]    Dr. Rodman Pickle is Medical Director for Pulmonary Rehab at [facility].

## 2021-10-16 ENCOUNTER — Other Ambulatory Visit: Payer: Self-pay

## 2021-10-16 ENCOUNTER — Encounter (HOSPITAL_COMMUNITY)
Admission: RE | Admit: 2021-10-16 | Discharge: 2021-10-16 | Disposition: A | Discharge status: Home / Self Care | Payer: PPO | Source: Ambulatory Visit | Attending: Surgery | Admitting: Surgery

## 2021-10-16 VITALS — Wt 197.5 lb

## 2021-10-16 DIAGNOSIS — J438 Other emphysema: Secondary | ICD-10-CM

## 2021-10-16 NOTE — Progress Notes (Signed)
Discharge Progress Report  Patient Details  Name: [name] [name] MRN: [id number] Date of Birth: [birth year] Referring Provider:   [name] Walk Test from 08/15/2021 in [place]  Referring Provider [name], MD        Number of Visits: 18  Reason for Discharge:  Patient has met program and personal goals.  Smoking History:  Social History   Tobacco Use  Smoking Status Former   Packs/day: 0.00   Years: 30.00   Pack years: 0.00   Types: Cigarettes   Start date: 12/01/2003   Quit date: 01/06/2013   Years since quitting: 8.7  Smokeless Tobacco Never    Diagnosis:  Other emphysema ([place])  ADL UCSD:  Pulmonary Assessment Scores     Row Name 08/15/21 1208 10/09/21 1451       ADL UCSD   ADL Phase Entry Exit    SOB Score total 48 29      CAT Score   CAT Score pre 15 8      mMRC Score   mMRC Score 0 --             Initial Exercise Prescription:  Initial Exercise Prescription - 08/15/21 1200       Date of Initial Exercise RX and Referring Provider   Date 08/15/21    Referring Provider [name], MD      NuStep   Level 2    SPM 75    Minutes 15    METs 2      Track   Laps 10    Minutes 15    METs 2.16      Prescription Details   Frequency (times per week) 2    Duration Progress to 30 minutes of continuous aerobic without signs/symptoms of physical distress      Intensity   THRR 40-80% of Max Heartrate 61-122    Ratings of Perceived Exertion 11-13    Perceived Dyspnea 0-4      Progression   Progression Continue progressive overload as per policy without signs/symptoms or physical distress.      Resistance Training   Training Prescription Yes    Weight Yellow bands    Reps 10-15             Discharge Exercise Prescription (Final Exercise Prescription Changes):  Exercise Prescription Changes - 10/14/21 1100       Response to Exercise   Blood Pressure (Admit) 120/74    Blood  Pressure (Exercise) 130/80    Blood Pressure (Exit) 124/60    Heart Rate (Admit) 64 bpm    Heart Rate (Exercise) 110 bpm    Heart Rate (Exit) 60 bpm    Oxygen Saturation (Admit) 97 %    Oxygen Saturation (Exercise) 90 %    Oxygen Saturation (Exit) 94 %    Rating of Perceived Exertion (Exercise) 11    Perceived Dyspnea (Exercise) 2    Duration Continue with 30 min of aerobic exercise without signs/symptoms of physical distress.    Intensity THRR unchanged      Progression   Progression Continue to progress workloads to maintain intensity without signs/symptoms of physical distress.      Resistance Training   Training Prescription Yes    Weight Blue bands    Reps 10-15    Time 10 Minutes      Oxygen   Liters 2      NuStep   Level 6    SPM  90    Minutes 15    METs 2.8      Track   Laps 12    Minutes 15             Functional Capacity:  6 Minute Walk     Row Name 08/15/21 1100 10/16/21 1219       6 Minute Walk   Phase Initial Discharge    Distance 1000 feet 1200 feet    Distance % Change -- 20 %    Distance Feet Change -- 200 ft    Walk Time 6 minutes 6 minutes    # of Rest Breaks 2  Break 1: 2:30 -4:00 and 5:35 - 6:00 1    MPH 1.9 2.77    METS 3.3 2.82    RPE 14 11    Perceived Dyspnea  2 1.5    VO2 Peak 11.5 9.88    Symptoms Yes (comment) No    Comments SOB, RPD = 2 --    Resting HR 70 bpm 76 bpm    Resting BP 136/80 130/80    Resting Oxygen Saturation  97 % 96 %    Exercise Oxygen Saturation  during 6 min walk 85 %  See interval oxygen readings 89 %    Max Ex. HR 133 bpm 103 bpm    Max Ex. BP 190/90 140/84    2 Minute Post BP 162/90  4 - 5 min BP 128/84 140/84      Interval HR   1 Minute HR 98 93    2 Minute HR 115 98    3 Minute HR 114 103    4 Minute HR 110 101    5 Minute HR 124 103    6 Minute HR 133 94    2 Minute Post HR 93 72    Interval Heart Rate? Yes Yes      Interval Oxygen   Interval Oxygen? Yes Yes    Baseline Oxygen  Saturation % -- 96 %    1 Minute Oxygen Saturation % 95 % 97 %    1 Minute Liters of Oxygen 0 L 2 L    2 Minute Oxygen Saturation % 86 % 92 %    2 Minute Liters of Oxygen 0 L 2 L    3 Minute Oxygen Saturation % 89 % 89 %    3 Minute Liters of Oxygen 0 L 2 L    4 Minute Oxygen Saturation % 88 % 92 %    4 Minute Liters of Oxygen 0 L 2 L    5 Minute Oxygen Saturation % 92 % 93 %    5 Minute Liters of Oxygen 0 L 2 L    6 Minute Oxygen Saturation % 85 % 92 %    6 Minute Liters of Oxygen 0 L 2 L    2 Minute Post Oxygen Saturation % 96 % 95 %    2 Minute Post Liters of Oxygen 0 L 0 L             Psychological, QOL, Others - Outcomes: PHQ 2/9: Depression screen [facility] 2/9 10/16/2021 08/15/2021  Decreased Interest 0 0  Down, Depressed, Hopeless 0 0  PHQ - 2 Score 0 0  Altered sleeping 0 0  Tired, decreased energy 0 0  Change in appetite 0 0  Feeling bad or failure about yourself  0 0  Trouble concentrating 0 0  Moving slowly or fidgety/restless 0 0  Suicidal  thoughts 0 0  PHQ-9 Score 0 0  Difficult doing work/chores Not difficult at all Not difficult at all    Quality of Life:   Personal Goals: Goals established at orientation with interventions provided to work toward goal.  Personal Goals and Risk Factors at Admission - 08/15/21 1209       Core Components/Risk Factors/Patient Goals on Admission   Intervention Provide education and demonstration as needed of appropriate use of medications, inhalers, and oxygen therapy.    Expected Outcomes Long Term: Maintain appropriate use of medications, inhalers, and oxygen therapy.;Short Term: Achieves understanding of medications use. Understands that oxygen is a medication prescribed by physician. Demonstrates appropriate use of inhaler and oxygen therapy.    Expected Outcomes Long Term: Maintenance of blood pressure at goal levels.;Short Term: Continued assessment and intervention until BP is < 140/59mm HG in hypertensive participants. <  130/64mm HG in hypertensive participants with diabetes, heart failure or chronic kidney disease.              Personal Goals Discharge:  Goals and Risk Factor Review     Row Name 09/01/21 1204 09/30/21 1405           Core Components/Risk Factors/Patient Goals Review   Personal Goals Review Increase knowledge of respiratory medications and ability to use respiratory devices properly.;Improve shortness of breath with ADL's;Develop more efficient breathing techniques such as purse lipped breathing and diaphragmatic breathing and practicing self-pacing with activity. Increase knowledge of respiratory medications and ability to use respiratory devices properly.;Improve shortness of breath with ADL's;Develop more efficient breathing techniques such as purse lipped breathing and diaphragmatic breathing and practicing self-pacing with activity.      Review [name] [name] has attended 2 weeks of pulmonary rehab and her blood pressure has improved since starting the program.  She has been subscribed a second BP lowering medication which has helped. [name] [name] is requiring 2L/min of supplemental oxygen while walking on the track for 15 minutes.  She does not need it when exercising on seated exercise equipment.  She has become independent when setting the equipment she uses in class.  She does not want to use oxygen at home exercising until she is evaluated by a pulmonologist, who she has been referred.  She is progressing in her exercise.      Expected Outcomes See admission goals. See admission goals.               Exercise Goals and Review:  Exercise Goals     Row Name 08/15/21 1011 09/02/21 0754 09/29/21 0818         Exercise Goals   Increase Physical Activity Yes Yes Yes     Intervention Provide advice, education, support and counseling about physical activity/exercise needs.;Develop an individualized exercise prescription for aerobic and resistive training based on initial evaluation findings, risk  stratification, comorbidities and participant's personal goals. Provide advice, education, support and counseling about physical activity/exercise needs.;Develop an individualized exercise prescription for aerobic and resistive training based on initial evaluation findings, risk stratification, comorbidities and participant's personal goals. Provide advice, education, support and counseling about physical activity/exercise needs.;Develop an individualized exercise prescription for aerobic and resistive training based on initial evaluation findings, risk stratification, comorbidities and participant's personal goals.     Expected Outcomes Long Term: Exercising regularly at least 3-5 days a week.;Long Term: Add in home exercise to make exercise part of routine and to increase amount of physical activity.;Short Term: Attend rehab on a regular basis to increase amount of physical  activity. Long Term: Exercising regularly at least 3-5 days a week.;Long Term: Add in home exercise to make exercise part of routine and to increase amount of physical activity.;Short Term: Attend rehab on a regular basis to increase amount of physical activity. Long Term: Exercising regularly at least 3-5 days a week.;Long Term: Add in home exercise to make exercise part of routine and to increase amount of physical activity.;Short Term: Attend rehab on a regular basis to increase amount of physical activity.     Increase Strength and Stamina Yes Yes Yes     Intervention Provide advice, education, support and counseling about physical activity/exercise needs.;Develop an individualized exercise prescription for aerobic and resistive training based on initial evaluation findings, risk stratification, comorbidities and participant's personal goals. Provide advice, education, support and counseling about physical activity/exercise needs.;Develop an individualized exercise prescription for aerobic and resistive training based on initial  evaluation findings, risk stratification, comorbidities and participant's personal goals. Provide advice, education, support and counseling about physical activity/exercise needs.;Develop an individualized exercise prescription for aerobic and resistive training based on initial evaluation findings, risk stratification, comorbidities and participant's personal goals.     Expected Outcomes Long Term: Improve cardiorespiratory fitness, muscular endurance and strength as measured by increased METs and functional capacity (6MWT);Short Term: Perform resistance training exercises routinely during rehab and add in resistance training at home;Short Term: Increase workloads from initial exercise prescription for resistance, speed, and METs. Long Term: Improve cardiorespiratory fitness, muscular endurance and strength as measured by increased METs and functional capacity (6MWT);Short Term: Perform resistance training exercises routinely during rehab and add in resistance training at home;Short Term: Increase workloads from initial exercise prescription for resistance, speed, and METs. Long Term: Improve cardiorespiratory fitness, muscular endurance and strength as measured by increased METs and functional capacity (6MWT);Short Term: Perform resistance training exercises routinely during rehab and add in resistance training at home;Short Term: Increase workloads from initial exercise prescription for resistance, speed, and METs.     Able to understand and use rate of perceived exertion (RPE) scale Yes Yes Yes     Intervention Provide education and explanation on how to use RPE scale Provide education and explanation on how to use RPE scale Provide education and explanation on how to use RPE scale     Expected Outcomes -- -- Short Term: Able to use RPE daily in rehab to express subjective intensity level;Long Term:  Able to use RPE to guide intensity level when exercising independently     Able to understand and use  Dyspnea scale Yes Yes Yes     Intervention Provide education and explanation on how to use Dyspnea scale Provide education and explanation on how to use Dyspnea scale Provide education and explanation on how to use Dyspnea scale     Expected Outcomes Short Term: Able to use Dyspnea scale daily in rehab to express subjective sense of shortness of breath during exertion;Long Term: Able to use Dyspnea scale to guide intensity level when exercising independently Short Term: Able to use Dyspnea scale daily in rehab to express subjective sense of shortness of breath during exertion;Long Term: Able to use Dyspnea scale to guide intensity level when exercising independently Short Term: Able to use Dyspnea scale daily in rehab to express subjective sense of shortness of breath during exertion;Long Term: Able to use Dyspnea scale to guide intensity level when exercising independently     Knowledge and understanding of Target Heart Rate Range (THRR) Yes Yes Yes     Intervention Provide education  and explanation of THRR including how the numbers were predicted and where they are located for reference Provide education and explanation of THRR including how the numbers were predicted and where they are located for reference Provide education and explanation of THRR including how the numbers were predicted and where they are located for reference     Expected Outcomes Short Term: Able to state/look up THRR;Long Term: Able to use THRR to govern intensity when exercising independently;Short Term: Able to use daily as guideline for intensity in rehab Short Term: Able to state/look up THRR;Long Term: Able to use THRR to govern intensity when exercising independently;Short Term: Able to use daily as guideline for intensity in rehab Short Term: Able to state/look up THRR;Long Term: Able to use THRR to govern intensity when exercising independently;Short Term: Able to use daily as guideline for intensity in rehab     Understanding  of Exercise Prescription Yes Yes Yes     Intervention Provide education, explanation, and written materials on patient's individual exercise prescription Provide education, explanation, and written materials on patient's individual exercise prescription Provide education, explanation, and written materials on patient's individual exercise prescription     Expected Outcomes Short Term: Able to explain program exercise prescription;Long Term: Able to explain home exercise prescription to exercise independently Short Term: Able to explain program exercise prescription;Long Term: Able to explain home exercise prescription to exercise independently Short Term: Able to explain program exercise prescription;Long Term: Able to explain home exercise prescription to exercise independently              Exercise Goals Re-Evaluation:  Exercise Goals Re-Evaluation     Row Name 09/02/21 0754 09/29/21 0819           Exercise Goal Re-Evaluation   Exercise Goals Review Increase Physical Activity;Able to understand and use Dyspnea scale;Understanding of Exercise Prescription;Increase Strength and Stamina;Knowledge and understanding of Target Heart Rate Range (THRR);Able to understand and use rate of perceived exertion (RPE) scale Increase Physical Activity;Able to understand and use Dyspnea scale;Understanding of Exercise Prescription;Increase Strength and Stamina;Knowledge and understanding of Target Heart Rate Range (THRR);Able to understand and use rate of perceived exertion (RPE) scale      Comments [name] [name] has completed 4 exercise sessions. She exercises for 15 min on the Nustep and track. She has progressed from exercising only on the Nustep since her BP has stabilized. [name] [name] averages 2.2 METs at level 3 on the Nustep and 1.81 METs on the track. She has to stop and take seated rest breaks on the track because she is deconditioned. [name] [name] performs the warmup and cooldown standing without limitations. She is  motivated to exercise and improve her functional capacity. Will continue to monitor and progress as able. [name] [name] has completed 12 exercise sessions. She exercises for 15 min on the Nustep and track. [name] [name] averages 2.4 METs at level 5 on the Nustep and 2.28 METs on the track. [name] [name] has progressed for both exercise modes and tolerates progressions well. She has been using oxygen on the track as this causes her to take less rest breaks. [name] [name] has mentioned to me that she notices a difference when she is exercising with oxygen on the track.  [name] [name] performs the warmup and cooldown standing without limitations. She enjoys Pulmonary Rehab as she jokes with me. Will continue to monitor and progress as able.      Expected Outcomes Through exercise at rehab and home, the pt will decrease shortness of breath with daily activities and feel confident  in carrying out an exercise regimen at home. Through exercise at rehab and home, the pt will decrease shortness of breath with daily activities and feel confident in carrying out an exercise regimen at home.               Nutrition & Weight - Outcomes:  Pre Biometrics - 08/15/21 1100       Pre Biometrics   Grip Strength 26 kg             Post Biometrics - 10/16/21 1507        Post  Biometrics   Weight 89.6 kg    BMI (Calculated) 30.46    Grip Strength 40 kg             Nutrition:  Nutrition Therapy & Goals - 08/19/21 1134       Nutrition Therapy   Diet General Healthful    Drug/Food Interactions Statins/Certain Fruits      Personal Nutrition Goals   Nutrition Goal Pt to build a healthy plate including vegetables, fruits, whole grains, and low-fat dairy products in a heart healthy meal plan.      Intervention Plan   Intervention Prescribe, educate and counsel regarding individualized specific dietary modifications aiming towards targeted core components such as weight, hypertension, lipid management, diabetes, heart failure and other  comorbidities.;Nutrition handout(s) given to patient.    Expected Outcomes Long Term Goal: Adherence to prescribed nutrition plan.;Short Term Goal: Understand basic principles of dietary content, such as calories, fat, sodium, cholesterol and nutrients.             Nutrition Discharge:   Education Questionnaire Score:  Knowledge Questionnaire Score - 10/09/21 1452       Knowledge Questionnaire Score   Post Score 17/18             Goals reviewed with patient; copy given to patient.

## 2021-11-11 ENCOUNTER — Other Ambulatory Visit: Payer: Self-pay | Admitting: Geriatric Medicine

## 2021-11-11 DIAGNOSIS — Z1231 Encounter for screening mammogram for malignant neoplasm of breast: Secondary | ICD-10-CM

## 2021-12-08 DIAGNOSIS — D3131 Benign neoplasm of right choroid: Secondary | ICD-10-CM | POA: Diagnosis not present

## 2021-12-08 DIAGNOSIS — Z961 Presence of intraocular lens: Secondary | ICD-10-CM | POA: Diagnosis not present

## 2021-12-08 DIAGNOSIS — H5202 Hypermetropia, left eye: Secondary | ICD-10-CM | POA: Diagnosis not present

## 2021-12-08 DIAGNOSIS — H35372 Puckering of macula, left eye: Secondary | ICD-10-CM | POA: Diagnosis not present

## 2021-12-08 DIAGNOSIS — H52223 Regular astigmatism, bilateral: Secondary | ICD-10-CM | POA: Diagnosis not present

## 2021-12-08 DIAGNOSIS — H524 Presbyopia: Secondary | ICD-10-CM | POA: Diagnosis not present

## 2021-12-12 ENCOUNTER — Encounter (INDEPENDENT_AMBULATORY_CARE_PROVIDER_SITE_OTHER): Payer: PPO | Admitting: Pulmonary Disease

## 2021-12-12 ENCOUNTER — Encounter: Payer: Self-pay | Admitting: Pulmonary Disease

## 2021-12-12 ENCOUNTER — Ambulatory Visit: Payer: PPO | Admitting: Pulmonary Disease

## 2021-12-12 ENCOUNTER — Other Ambulatory Visit: Payer: Self-pay

## 2021-12-12 VITALS — BP 124/80 | HR 67 | Temp 98.1°F | Ht 68.0 in | Wt 200.0 lb

## 2021-12-12 DIAGNOSIS — J449 Chronic obstructive pulmonary disease, unspecified: Secondary | ICD-10-CM

## 2021-12-12 DIAGNOSIS — J4540 Moderate persistent asthma, uncomplicated: Secondary | ICD-10-CM

## 2021-12-12 LAB — PULMONARY FUNCTION TEST
DL/VA % pred: 63 %
DL/VA: 2.55 ml/min/mmHg/L
DLCO cor % pred: 61 %
DLCO cor: 13.74 ml/min/mmHg
DLCO unc % pred: 61 %
DLCO unc: 13.74 ml/min/mmHg
FEF 25-75 Post: 0.67 L/sec
FEF 25-75 Pre: 0.57 L/sec
FEF2575-%Change-Post: 17 %
FEF2575-%Pred-Post: 29 %
FEF2575-%Pred-Pre: 25 %
FEV1-%Change-Post: 10 %
FEV1-%Pred-Post: 50 %
FEV1-%Pred-Pre: 45 %
FEV1-Post: 1.39 L
FEV1-Pre: 1.26 L
FEV1FVC-%Change-Post: 1 %
FEV1FVC-%Pred-Pre: 60 %
FEV6-%Change-Post: 8 %
FEV6-%Pred-Post: 82 %
FEV6-%Pred-Pre: 76 %
FEV6-Post: 2.87 L
FEV6-Pre: 2.65 L
FEV6FVC-%Change-Post: -2 %
FEV6FVC-%Pred-Post: 101 %
FEV6FVC-%Pred-Pre: 104 %
FVC-%Change-Post: 8 %
FVC-%Pred-Post: 81 %
FVC-%Pred-Pre: 75 %
FVC-Post: 2.94 L
FVC-Pre: 2.7 L
Post FEV1/FVC ratio: 47 %
Post FEV6/FVC ratio: 97 %
Pre FEV1/FVC ratio: 46 %
Pre FEV6/FVC Ratio: 100 %
RV % pred: 151 %
RV: 3.54 L
TLC % pred: 127 %
TLC: 7.23 L

## 2021-12-12 NOTE — Patient Instructions (Addendum)
I am glad you are doing well with regard to your breathing Continue the Trelegy inhaler Follow-up in 6 months with a walk test

## 2021-12-12 NOTE — Progress Notes (Signed)
[name] [name]    [id number]    Apr 22, 1956  Primary Care Physician:Stoneking, Christiane Ha, MD  Referring Physician: Lajean Manes, MD [address] [place],  [place] [address]  Chief complaint: Follow-up for COPD  HPI: 66 year old with history of COPD.  Previously followed by Dr. Melvyn Novas.  Has not been seen PCCM since 2013 Maintained on Spiriva.  Advair was started in June 2021 Complains of chronic dyspnea on exertion.  Occasional cough, no fevers or chills Other medical issues include allergies with postnasal drip, GERD for which she is on Prilosec twice daily.  She has occasional hoarseness of voice Evaluated by Dr. Valetta Mole ENT in April 2021 with laryngoscope showing vocal cord edema consistent with LPR  History notable for thoracic aortic aneurysm which was noted during follow-up for lung nodules.  The aneurysm is slowly increasing in size.  She has been evaluated by cardiothoracic surgery with continued surveillance recommended.  Lung nodules have subsequently resolved on follow-up CT scans  Finished pulmonary rehab but coronary in 2022 Notes desats to 85% on exertion and has been getting supplemental oxygen  Pets: No pets Occupation: Retired Customer service manager Exposures: No mold, hot tub, Customer service manager.  No feather pillows or comforter Smoking history: 30-pack-year smoker.  Quit in 2007 Travel history: No significant travel history Relevant family history: No family history of lung disease  Interim history:  Inhaler switched to Trelegy this month.  She feels that it is working better for her Continues surveillance CTs for thoracic aortic aneurysm  Has intermittent hoarseness of voice which is unchanged   Outpatient Encounter Medications as of 12/12/2021  Medication Sig   albuterol (VENTOLIN HFA) 108 (90 Base) MCG/ACT inhaler Inhale 2 puffs into the lungs every 4 (four) hours as needed for wheezing or shortness of breath.   amLODipine (NORVASC) 2.5 MG tablet Take 2.5 mg by  mouth daily.   aspirin EC 81 MG tablet Take 81 mg by mouth daily. Swallow whole.   cholecalciferol (VITAMIN D) 1000 units tablet Take 1,000 Units by mouth daily.   Fluticasone-Umeclidin-Vilant (TRELEGY ELLIPTA) 100-62.5-25 MCG/ACT AEPB    losartan (COZAAR) 25 MG tablet Take 25 mg by mouth daily.   Multiple Vitamin (MULTIVITAMIN) tablet Take 1 tablet by mouth daily.   omeprazole (PRILOSEC) 20 MG capsule Take 20 mg by mouth 2 (two) times daily.   simvastatin (ZOCOR) 40 MG tablet Take 40 mg by mouth at bedtime.   SYNTHROID 100 MCG tablet Take 100 mcg by mouth daily.   ADVAIR DISKUS 100-50 MCG/ACT AEPB Inhale 1 puff into the lungs 2 (two) times daily.   SPIRIVA RESPIMAT 2.5 MCG/ACT AERS Inhale 2.5 mg into the lungs in the morning.   No facility-administered encounter medications on file as of 12/12/2021.    Physical Exam: Blood pressure 124/80, pulse 67, temperature 98.1 F (36.7 C), temperature source Oral, height 5\' 8"  (1.727 m), weight 200 lb (90.7 kg), SpO2 96 %. Gen:      No acute distress HEENT:  EOMI, sclera anicteric Neck:     No masses; no thyromegaly Lungs:    Clear to auscultation bilaterally; normal respiratory effort CV:         Regular rate and rhythm; no murmurs Abd:      + bowel sounds; soft, non-tender; no palpable masses, no distension Ext:    No edema; adequate peripheral perfusion Skin:      Warm and dry; no rash Neuro: alert and oriented x 3 Psych: normal  mood and affect   Data Reviewed: Imaging: CT chest 08/21/2021-stable thoracic aortic aneurysm measuring 4.7 cm, mild coronary artery calcification, severe emphysema.   I have reviewed the images personally.  PFTs: 08/16/2012 FVC 3.71 [96%], FEV1 1.76 [50%], F/F 48, TLC 7.09 [125%], DLCO 17.75 [59%]   12/12/2021 FVC 2.94 [81%], FEV1 1.39 [50%], F/F 47, TLC 7.23 [127%], DLCO 13.74 [61%] Severe obstruction with hyperinflation, air trapping.  Moderate diffusion impairment  Labs: CBC 05/20/2021-WBC 8.5, eos 7%,  absolute eosinophil count 595  Assessment:  Severe COPD, asthma overlap syndrome May have asthmatic component of COPD given elevated peripheral eosinophils in the past.  She was transitioned to trelegy and is doing well with the new inhaler  She has finished rehab with mild desats noted to 85% on exertion at pulmonary rehab.  She would like to avoid starting on oxygen right away and wants to hold off for now.  Recheck qualifying work at return visit  Thoracic aortic aneurysm She is getting CT scans every 6 months.  No pulmonary nodules noted on CTs  Hoarseness of voice She has intermittent hoarseness of voice due to postnasal drip, GERD Reminded to rinse mouth after every use Continue Prilosec, elevate the head of the bed She will try over-the-counter Nasonex and antihistamines  Plan/Recommendations: Continue trelegy Qualifying walk at return visit check O2 levels on exertion at return visit  Marshell Garfinkel MD  Pulmonary and Critical Care 12/12/2021, 10:16 AM  CC: Lajean Manes, MD

## 2021-12-26 ENCOUNTER — Ambulatory Visit
Admission: RE | Admit: 2021-12-26 | Discharge: 2021-12-26 | Disposition: A | Discharge status: Home / Self Care | Payer: PPO | Source: Ambulatory Visit | Attending: Geriatric Medicine | Admitting: Geriatric Medicine

## 2021-12-26 DIAGNOSIS — Z1231 Encounter for screening mammogram for malignant neoplasm of breast: Secondary | ICD-10-CM

## 2022-01-05 DIAGNOSIS — L821 Other seborrheic keratosis: Secondary | ICD-10-CM | POA: Diagnosis not present

## 2022-01-05 DIAGNOSIS — L812 Freckles: Secondary | ICD-10-CM | POA: Diagnosis not present

## 2022-01-05 DIAGNOSIS — L720 Epidermal cyst: Secondary | ICD-10-CM | POA: Diagnosis not present

## 2022-01-05 DIAGNOSIS — L820 Inflamed seborrheic keratosis: Secondary | ICD-10-CM | POA: Diagnosis not present

## 2022-01-05 DIAGNOSIS — D1801 Hemangioma of skin and subcutaneous tissue: Secondary | ICD-10-CM | POA: Diagnosis not present

## 2022-01-05 DIAGNOSIS — L7211 Pilar cyst: Secondary | ICD-10-CM | POA: Diagnosis not present

## 2022-02-02 DIAGNOSIS — Z79899 Other long term (current) drug therapy: Secondary | ICD-10-CM | POA: Diagnosis not present

## 2022-02-02 DIAGNOSIS — E039 Hypothyroidism, unspecified: Secondary | ICD-10-CM | POA: Diagnosis not present

## 2022-02-02 DIAGNOSIS — K909 Intestinal malabsorption, unspecified: Secondary | ICD-10-CM | POA: Diagnosis not present

## 2022-02-02 DIAGNOSIS — I712 Thoracic aortic aneurysm, without rupture, unspecified: Secondary | ICD-10-CM | POA: Diagnosis not present

## 2022-02-02 DIAGNOSIS — I10 Essential (primary) hypertension: Secondary | ICD-10-CM | POA: Diagnosis not present

## 2022-02-02 DIAGNOSIS — J449 Chronic obstructive pulmonary disease, unspecified: Secondary | ICD-10-CM | POA: Diagnosis not present

## 2022-02-02 DIAGNOSIS — K219 Gastro-esophageal reflux disease without esophagitis: Secondary | ICD-10-CM | POA: Diagnosis not present

## 2022-02-02 DIAGNOSIS — I700 Atherosclerosis of aorta: Secondary | ICD-10-CM | POA: Diagnosis not present

## 2022-02-02 DIAGNOSIS — Z0000 Encounter for general adult medical examination without abnormal findings: Secondary | ICD-10-CM | POA: Diagnosis not present

## 2022-02-02 DIAGNOSIS — Z1389 Encounter for screening for other disorder: Secondary | ICD-10-CM | POA: Diagnosis not present

## 2022-02-02 DIAGNOSIS — E7800 Pure hypercholesterolemia, unspecified: Secondary | ICD-10-CM | POA: Diagnosis not present

## 2022-02-20 ENCOUNTER — Ambulatory Visit: Payer: PPO | Admitting: Thoracic Surgery (Cardiothoracic Vascular Surgery)

## 2022-03-02 ENCOUNTER — Ambulatory Visit (INDEPENDENT_AMBULATORY_CARE_PROVIDER_SITE_OTHER)
Admission: RE | Admit: 2022-03-02 | Discharge: 2022-03-02 | Disposition: A | Discharge status: Home / Self Care | Payer: PPO | Source: Ambulatory Visit | Attending: Internal Medicine | Admitting: Internal Medicine

## 2022-03-02 DIAGNOSIS — I7121 Aneurysm of the ascending aorta, without rupture: Secondary | ICD-10-CM

## 2022-03-02 MED ORDER — IOHEXOL 350 MG/ML SOLN
100.0000 mL | Freq: Once | INTRAVENOUS | Status: AC | PRN
  Administered 2022-03-02: 100 mL via INTRAVENOUS

## 2022-03-06 ENCOUNTER — Ambulatory Visit: Payer: PPO | Admitting: Thoracic Surgery (Cardiothoracic Vascular Surgery)

## 2022-03-11 ENCOUNTER — Other Ambulatory Visit: Payer: Self-pay | Admitting: *Deleted

## 2022-03-11 DIAGNOSIS — I7121 Aneurysm of the ascending aorta, without rupture: Secondary | ICD-10-CM

## 2022-03-11 NOTE — Progress Notes (Signed)
Repeat chest ct aorta in 6 months. ?

## 2022-03-16 ENCOUNTER — Other Ambulatory Visit: Payer: Self-pay | Admitting: *Deleted

## 2022-03-16 DIAGNOSIS — R911 Solitary pulmonary nodule: Secondary | ICD-10-CM

## 2022-03-19 ENCOUNTER — Ambulatory Visit: Payer: PPO | Admitting: Physician Assistant

## 2022-03-19 VITALS — BP 128/87 | HR 85 | Ht 67.5 in | Wt 199.0 lb

## 2022-03-19 DIAGNOSIS — I7121 Aneurysm of the ascending aorta, without rupture: Secondary | ICD-10-CM

## 2022-03-19 NOTE — Patient Instructions (Signed)
Patient is counseled regarding the importance of long term risk factor modification as they pertain to the presence of ischemic heart disease including avoiding the use of all tobacco products, dietary modifications and medical therapy for diabetes, cholesterol and lipid management, and regular exercise.   ? ?Make every effort to maintain a "heart-healthy" lifestyle with regular physical exercise and adherence to a low-fat, low-carbohydrate diet.  Continue to seek regular follow-up appointments with your primary care physician and/or cardiologist. ? ?AVOID FLUOROQUINOLONES AS THIS CAN INCREASE RISK OF DISSECTION ?

## 2022-03-19 NOTE — Progress Notes (Signed)
? ?   ?[address] ?      [place] [address] ?            [phone]   ? ?  ?[name] [name] ?[id number] ?07-Jan-1956 ? ?History of Present Illness: ? ?[name] [name] is a 66 yo female with history of HTN, Hypothyroidism, COPD, and Ascending Aortic Aneurysm.  She was last seen in September of last year at which time her aneurysm was stable at 4.7 cm.  She presents today for 6 month follow up.  Overall patient states she is doing okay.  She denies chest pain.  She has shortness of breath at baseline with her COPD.  She does note that she has some internal pain when she takes deep breaths like her ribs are hitting her chest wall.  She is a former smoker.  She also states she doesn't like that her results are readily available to view at this can be stressful when it is abnormal. ? ?Current Outpatient Medications on File Prior to Visit  ?Medication Sig Dispense Refill  ? ADVAIR DISKUS 100-50 MCG/ACT AEPB Inhale 1 puff into the lungs 2 (two) times daily.    ? albuterol (VENTOLIN HFA) 108 (90 Base) MCG/ACT inhaler Inhale 2 puffs into the lungs every 4 (four) hours as needed for wheezing or shortness of breath.    ? amLODipine (NORVASC) 2.5 MG tablet Take 2.5 mg by mouth daily.    ? aspirin EC 81 MG tablet Take 81 mg by mouth daily. Swallow whole.    ? cholecalciferol (VITAMIN D) 1000 units tablet Take 1,000 Units by mouth daily.    ? Fluticasone-Umeclidin-Vilant (TRELEGY ELLIPTA) 100-62.5-25 MCG/ACT AEPB     ? losartan (COZAAR) 25 MG tablet Take 25 mg by mouth daily.    ? Multiple Vitamin (MULTIVITAMIN) tablet Take 1 tablet by mouth daily.    ? omeprazole (PRILOSEC) 20 MG capsule Take 20 mg by mouth 2 (two) times daily.    ? simvastatin (ZOCOR) 40 MG tablet Take 40 mg by mouth at bedtime.    ? SPIRIVA RESPIMAT 2.5 MCG/ACT AERS Inhale 2.5 mg into the lungs in the morning.    ? SYNTHROID 100 MCG tablet Take 100 mcg by mouth daily.    ? ?No current facility-administered medications on file prior to visit.   ? ? ? ?BP 128/87   Pulse 85   Ht 5' 7.5" (1.715 m)   Wt 199 lb (90.3 kg)   SpO2 97% Comment: RA  BMI 30.71 kg/m?  ? ?Physical Exam ? ?Gen: NAD ?Neck: no Carotid bruit  ?Heart:RRR ?Lungs:CTA bilaterally ?VBT:YOMA non-tender non-distended  ?Ext: mild edema ?Neuro:grossly intact ? ?CTA Results: ? ?FINDINGS: ?Cardiovascular: Stable fusiform aneurysmal dilatation of the ?ascending thoracic aorta, maximal diameter remains 47 mm. Remainder ?of the aorta is normal in caliber. No significant interval change. ?No acute aortic process or dissection. No mediastinal hemorrhage or ?hematoma. ?  ?Central pulmonary arteries are normal in caliber and patent. No ?acute pulmonary embolus by CTA. ?  ?Normal heart size.  No pericardial effusion. ?  ?Central venous structures are patent.  No veno-occlusive process. ?  ?Mediastinum/Nodes: Status post partial right thyroidectomy. No left ?thyroid abnormality. Trachea and central airways are patent. ?Esophagus nondilated. No adenopathy. ?  ?Lungs/Pleura: Similar appearance of severe bullous emphysema most ?pronounced in the upper lobes. Biapical posterior upper lobe ?irregular bandlike opacities worse in the left, nonspecific for ?areas of progressive pleuroparenchymal scarring, areas of ?atelectasis/partial consolidation, or atypical ?infectious/inflammatory process. ?  ?  Lungs are hyperinflated. ?  ?Left lower lobe superior segment 12 mm nodule appears new compared ?to the prior study, image 59/5 along the peribronchovascular ?structures. ?  ?No pleural abnormality, effusion, or pneumothorax. ?  ?Upper Abdomen: No acute abnormality. ?  ?Musculoskeletal: Minor endplate degenerative changes. No acute ?osseous finding. No compression fracture. Sternum intact. ?  ?Review of the MIP images confirms the above findings. ?  ?IMPRESSION: ?Stable 47 mm ascending thoracic aortic aneurysm. Recommend ?semi-annual imaging followup by CTA or MRA and referral to ?cardiothoracic surgery if not  already obtained. This recommendation ?follows 2010 ACCF/AHA/AATS/ACR/ASA/SCA/SCAI/SIR/STS/SVM Guidelines ?for the Diagnosis and Management of Patients With Thoracic Aortic ?Disease. Circulation. 2010; 121: H734-K876. Aortic aneurysm NOS ?(ICD10-I71.9) ?  ?New 12 mm peribronchovascular nodule in the superior segment left ?lower lobe. Consider one of the following in 3 months for both ?low-risk and high-risk individuals: (a) repeat chest CT, (b) ?follow-up PET-CT, this recommendation follows the consensus ?statement: Guidelines for Management of Incidental Pulmonary Nodules ?Detected on CT Images: From the Fleischner Society 2017; Radiology ?2017; 811:572-620. ?  ?Increased biapical posterior bandlike and linear opacities, ?nonspecific for progressive apical scarring, partial apical lung ?collapse/consolidation, or infectious/inflammatory process. ?  ?Similar severe background emphysema ?  ?Aortic Atherosclerosis (ICD10-I70.0) and Emphysema (ICD10-J43.9). ?  ?Aortic aneurysm NOS (ICD10-I71.9). ?  ?  ?Electronically Signed ?  By: Jerilynn Mages.  Shick M.D. ?  On: 03/02/2022 10:27 ?  ? ? ? ?A/P: ? ?Ascending Aortic Aneurysm- measuring at 4.7 cm, which remains stable from previous imaging  ?New 12 mm Pulmonary Nodule-significant smoking history, patient is scheduled with Dr. Vaughan Browner in July with Super D CT scan ?Severe Bullous Emphysema ?Hypothyroidism ?HTN ? ?Risk Modification: ? ?Statin:  Yes ? ?Smoking cessation instruction/counseling given:   ?-Hasn't smoked in a long time ? ?Patient was counseled on importance of Blood Pressure Control.  Despite Medical intervention if the patient notices persistently elevated blood pressure readings.  They are instructed to contact their Primary Care Physician ? ?Please avoid use of Fluoroquinolones as this can potentially increase your risk of Aortic Rupture and/or Dissection ? ?Patient educated on signs and symptoms of Aortic Dissection, handout also provided in AVS ? ?Ellwood Handler,  PA-C ?03/19/22 ? ? ? ? ?

## 2022-03-20 ENCOUNTER — Ambulatory Visit: Payer: PPO | Admitting: Thoracic Surgery (Cardiothoracic Vascular Surgery)

## 2022-03-26 NOTE — Progress Notes (Signed)
?Cardiology Office Note:   ? ?Date:  04/01/2022  ? ?ID:  [name] [name], DOB June 04, 1956, MRN [id number] ? ?PCP:  Lajean Manes, MD  ? ?[facility] Prov[facility] ?Cardiologist:  Lenna Sciara, MD ?Referring MD: Lajean Manes, MD  ? ?Chief Complaint/Reason for Referral: Routine follow-up aortic aneurysm ? ?ASSESSMENT:   ? ?1. Aneurysm of ascending aorta without rupture (HCC)   ?2. Coronary artery calcification   ?3. Hyperlipidemia, unspecified hyperlipidemia type   ?4. Hypertension, unspecified type   ?5. Chronic obstructive pulmonary disease, unspecified COPD type ([place])   ?6. Aortic atherosclerosis ([place])   ? ? ?PLAN:   ? ?In order of problems listed above: ?1.  Ascending aortic aneurysm: Followed by cardiothoracic surgery and stable size.  Continue ongoing surveillance.  We discussed the fact that her father had a thoracic aneurysm of around 6.3 cm required repair in 2012.  Given her family history of thoracic aortic aneurysm this may lower the threshold for elective repair.  She is being followed by cardiothoracic surgery and I have asked her to mention this to them the next time she is seen in their office.  Follow-up in 6 months.  2.  Coronary artery calcification:  Continue aspirin and statin with strict blood pressure control. ?3.  Hyperlipidemia: We will check lipid panel, LFTs, and LP(a) today.  Goal LDL is less than 70. ?4.  Hypertension: Blood pressures well controlled on her current regimen. ?5.  COPD: This is followed by the pulmonary division. ?6.  Aortic atherosclerosis: Continue aspirin and statin with strict blood pressure control. ? ?Dispo:  Return in about 6 months (around 10/02/2022).  ? ?  ? ?Medication Adjustments/Labs and Tests Ordered: ?Current medicines are reviewed at length with the patient today.  Concerns regarding medicines are outlined above. ? ?The following changes have been made:  no change  ? ?Labs/tests ordered: ?No orders of the defined types were placed in this  encounter. ? ? ?Medication Changes: ?No orders of the defined types were placed in this encounter. ? ? ? ?Current medicines are reviewed at length with the patient today.  The patient does not have concerns regarding medicines. ? ? ?History of Present Illness:   ? ?FOCUSED PROBLEM LIST:   ?1.  Thoracic aortic ascending aneurysm around 4.6 to 4.7 cm upon echocardiography and CT scanning; every 6 month surveillance required with family history of thoracic aneurysm ?2.  Multivessel coronary artery calcifications ?3.  COPD with emphysema ?4.  Hyperlipidemia ?6.  Hypertension ? ?The patient is a 66 y.o. female with the indicated medical history here for routine follow up. ? ?November 2022: The patient was doing well and no changes were made to her medical regimen with plans for every 6 month CT evaluation for her aneurysm. ? ?Today: The patient is doing well.  She would like to exercise on a stationary bike.  She denies any chest pain, palpitations, Paraschos nocturnal dyspnea, orthopnea.  She has been completely compliant with her medications.  She was recently changed to rosuvastatin by her primary care provider.  She is tolerating this well. ?  ? ?Current Medications: ?Current Meds  ?Medication Sig  ? albuterol (VENTOLIN HFA) 108 (90 Base) MCG/ACT inhaler Inhale 2 puffs into the lungs every 4 (four) hours as needed for wheezing or shortness of breath.  ? amLODipine (NORVASC) 2.5 MG tablet Take 2.5 mg by mouth daily.  ? aspirin EC 81 MG tablet Take 81 mg by mouth daily. Swallow whole.  ? cholecalciferol (VITAMIN D) 1000 units  tablet Take 1,000 Units by mouth daily.  ? Fluticasone-Umeclidin-Vilant (TRELEGY ELLIPTA) 100-62.5-25 MCG/ACT AEPB   ? losartan (COZAAR) 25 MG tablet Take 25 mg by mouth daily.  ? Multiple Vitamin (MULTIVITAMIN) tablet Take 1 tablet by mouth daily.  ? omeprazole (PRILOSEC) 20 MG capsule Take 20 mg by mouth 2 (two) times daily.  ? rosuvastatin (CRESTOR) 10 MG tablet Take 10 mg by mouth at bedtime.   ? SYNTHROID 100 MCG tablet Take 100 mcg by mouth daily.  ?  ? ?Allergies:    ?Peanut-containing drug products, Other, and Codeine  ? ?Social History:   ?Social History  ? ?Tobacco Use  ? Smoking status: Former  ?  Packs/day: 0.00  ?  Years: 30.00  ?  Pack years: 0.00  ?  Types: Cigarettes  ?  Start date: 12/01/2003  ?  Quit date: 01/06/2013  ?  Years since quitting: 9.2  ? Smokeless tobacco: Never  ?Vaping Use  ? Vaping Use: Never used  ?Substance Use Topics  ? Alcohol use: Yes  ?  Comment: 2 beers per week  ? Drug use: No  ?  ? ?Family Hx: ?Family History  ?Problem Relation Age of Onset  ? Colon cancer Mother   ? Breast cancer Maternal Aunt   ? Allergies Brother   ?  ? ?Review of Systems:   ?Please see the history of present illness.    ?All other systems reviewed and are negative. ?  ? ? ?EKGs/Labs/Other Test Reviewed:   ? ?EKG:  EKG performed June 2022 that I personally reviewed demonstrates sinus rhythm with PACs and nonspecific ST and T wave changes. ? ?Prior CV studies: ? ?TTE October 2022 demonstrates ejection fraction of 60 to 65% with grade 1 diastolic dysfunction no significant valvular abnormalities and ascending aortic diameter of 45 mm ? ?CT April 2022 demonstrates thoracic aortic aneurysm measuring 4.7 cm with coronary artery disease and severe emphysematous changes ? ?CT September 2022 demonstrates stable thoracic aortic aneurysm measuring 4.7 cm ? ?CT April 2023 demonstrates a stable 4.7 cm thoracic aortic aneurysm ? ? ?Other studies Reviewed: ?Review of the additional studies/records demonstrates: None relevant ? ?Recent Labs: ?05/20/2021: B Natriuretic Peptide 77.9; BUN 11; Creatinine, Ser 0.74; Hemoglobin 12.6; Platelets 316; Potassium 3.5; Sodium 137  ? ?Recent Lipid Panel ?No results found for: CHOL, TRIG, HDL, LDLCALC, LDLDIRECT ? ?Risk Assessment/Calculations:   ? ? ?    ? ?Physical Exam:   ? ?VS:  BP 126/84   Pulse 69   Ht 5' 7.5" (1.715 m)   Wt 204 lb 3.2 oz (92.6 kg)   SpO2 96%   BMI 31.51  kg/m?    ?Wt Readings from Last 3 Encounters:  ?04/01/22 204 lb 3.2 oz (92.6 kg)  ?03/19/22 199 lb (90.3 kg)  ?12/12/21 200 lb (90.7 kg)  ?  ?GENERAL:  No apparent distress, AOx3 ?HEENT:  No carotid bruits, +2 carotid impulses, no scleral icterus ?CAR: RRR no murmurs, gallops, rubs, or thrills ?RES:  Clear to auscultation bilaterally ?ABD:  Soft, nontender, nondistended, positive bowel sounds x 4 ?VASC:  +2 radial pulses, +2 carotid pulses, palpable pedal pulses ?NEURO:  CN 2-12 grossly intact; motor and sensory grossly intact ?PSYCH:  No active depression or anxiety ?EXT:  No edema, ecchymosis, or cyanosis ? ?Signed, ?Early Osmond, MD  ?04/01/2022 10:06 AM    ?[place] ?[place], [place], [place]  [address] ?Phone: [phone]; Fax: [phone]  ? ?Note:  This  document was prepared using Systems analyst and may include unintentional dictation errors. ?

## 2022-04-01 ENCOUNTER — Encounter: Payer: Self-pay | Admitting: Internal Medicine

## 2022-04-01 ENCOUNTER — Ambulatory Visit: Payer: PPO | Admitting: Internal Medicine

## 2022-04-01 VITALS — BP 126/84 | HR 69 | Ht 67.5 in | Wt 204.2 lb

## 2022-04-01 DIAGNOSIS — I10 Essential (primary) hypertension: Secondary | ICD-10-CM

## 2022-04-01 DIAGNOSIS — I700 Atherosclerosis of aorta: Secondary | ICD-10-CM | POA: Diagnosis not present

## 2022-04-01 DIAGNOSIS — E785 Hyperlipidemia, unspecified: Secondary | ICD-10-CM

## 2022-04-01 DIAGNOSIS — J449 Chronic obstructive pulmonary disease, unspecified: Secondary | ICD-10-CM | POA: Diagnosis not present

## 2022-04-01 DIAGNOSIS — I2584 Coronary atherosclerosis due to calcified coronary lesion: Secondary | ICD-10-CM | POA: Diagnosis not present

## 2022-04-01 DIAGNOSIS — I2510 Atherosclerotic heart disease of native coronary artery without angina pectoris: Secondary | ICD-10-CM

## 2022-04-01 DIAGNOSIS — I7121 Aneurysm of the ascending aorta, without rupture: Secondary | ICD-10-CM

## 2022-04-01 NOTE — Patient Instructions (Signed)
Medication Instructions:  ?No changes today ?*If you need a refill on your cardiac medications before your next appointment, please call your pharmacy* ? ? ?Lab Work: ?Today: Lp(a), lipid panel, liver function ? ?If you have labs (blood work) drawn today and your tests are completely normal, you will receive your results only by: ?MyChart Message (if you have MyChart) OR ?A paper copy in the mail ?If you have any lab test that is abnormal or we need to change your treatment, we will call you to review the results. ? ? ?Testing/Procedures: ?none ? ? ?Follow-Up: ?At [facility], you and your health needs are our priority.  As part of our continuing mission to provide you with exceptional heart care, we have created designated Provider Care Teams.  These Care Teams include your primary Cardiologist (physician) and Advanced Practice Providers (APPs -  Physician Assistants and Nurse Practitioners) who all work together to provide you with the care you need, when you need it. ? ?We recommend signing up for the patient portal called "MyChart".  Sign up information is provided on this After Visit Summary.  MyChart is used to connect with patients for Virtual Visits (Telemedicine).  Patients are able to view lab/test results, encounter notes, upcoming appointments, etc.  Non-urgent messages can be sent to your provider as well.   ?To learn more about what you can do with MyChart, go to [email or web address].   ? ?Your next appointment:   ?6 month(s) ? ?The format for your next appointment:   ?In Person ? ?Provider:   ?Early Osmond, MD   ? ? ?Important Information About Sugar ? ? ? ? ?  ?

## 2022-04-02 ENCOUNTER — Telehealth: Payer: Self-pay | Admitting: Internal Medicine

## 2022-04-02 ENCOUNTER — Encounter: Payer: Self-pay | Admitting: Internal Medicine

## 2022-04-02 DIAGNOSIS — E785 Hyperlipidemia, unspecified: Secondary | ICD-10-CM

## 2022-04-02 LAB — HEPATIC FUNCTION PANEL
ALT: 23 IU/L (ref 0–32)
AST: 24 IU/L (ref 0–40)
Albumin: 4.4 g/dL (ref 3.8–4.8)
Alkaline Phosphatase: 104 IU/L (ref 44–121)
Bilirubin Total: 0.2 mg/dL (ref 0.0–1.2)
Bilirubin, Direct: <0.1 mg/dL (ref 0.00–0.40)
Total Protein: 6.8 g/dL (ref 6.0–8.5)

## 2022-04-02 LAB — LIPID PANEL
Chol/HDL Ratio: 3.4 ratio (ref 0.0–4.4)
Cholesterol, Total: 167 mg/dL (ref 100–199)
HDL: 49 mg/dL (ref >39)
LDL Chol Calc (NIH): 98 mg/dL (ref 0–99)
Triglycerides: 110 mg/dL (ref 0–149)
VLDL Cholesterol Cal: 20 mg/dL (ref 5–40)

## 2022-04-02 LAB — LIPOPROTEIN A (LPA): Lipoprotein (a): 326.1 nmol/L — ABNORMAL HIGH (ref <75.0)

## 2022-04-02 MED ORDER — ROSUVASTATIN CALCIUM 40 MG PO TABS: 40.0000 mg | ORAL_TABLET | Freq: Every day | ORAL | 3 refills | Status: DC

## 2022-04-02 NOTE — Telephone Encounter (Signed)
LDL not at goal and Lp(a) elevated.  Her LDL is 98, we need it to be as low as possible.  Please increase crestor to '40mg'$  and check lipid panel and LFTs in 2 months ? ?Pt aware of recommendations and agrees with plan ./cy. ?

## 2022-04-02 NOTE — Telephone Encounter (Signed)
Patient return call for her lab results.  ?

## 2022-04-06 ENCOUNTER — Other Ambulatory Visit: Payer: Self-pay | Admitting: Obstetrics and Gynecology

## 2022-04-06 DIAGNOSIS — Z0142 Encounter for cervical smear to confirm findings of recent normal smear following initial abnormal smear: Secondary | ICD-10-CM | POA: Diagnosis not present

## 2022-04-06 DIAGNOSIS — M899 Disorder of bone, unspecified: Secondary | ICD-10-CM | POA: Diagnosis not present

## 2022-04-06 DIAGNOSIS — Z6831 Body mass index (BMI) 31.0-31.9, adult: Secondary | ICD-10-CM | POA: Diagnosis not present

## 2022-04-06 DIAGNOSIS — Z01419 Encounter for gynecological examination (general) (routine) without abnormal findings: Secondary | ICD-10-CM | POA: Diagnosis not present

## 2022-04-06 DIAGNOSIS — E2839 Other primary ovarian failure: Secondary | ICD-10-CM

## 2022-04-06 DIAGNOSIS — Z01411 Encounter for gynecological examination (general) (routine) with abnormal findings: Secondary | ICD-10-CM | POA: Diagnosis not present

## 2022-04-06 DIAGNOSIS — Z124 Encounter for screening for malignant neoplasm of cervix: Secondary | ICD-10-CM | POA: Diagnosis not present

## 2022-05-13 DIAGNOSIS — R2232 Localized swelling, mass and lump, left upper limb: Secondary | ICD-10-CM | POA: Diagnosis not present

## 2022-05-14 ENCOUNTER — Other Ambulatory Visit: Payer: Self-pay | Admitting: Orthopedic Surgery

## 2022-05-14 DIAGNOSIS — R2232 Localized swelling, mass and lump, left upper limb: Secondary | ICD-10-CM

## 2022-05-18 DIAGNOSIS — E039 Hypothyroidism, unspecified: Secondary | ICD-10-CM | POA: Diagnosis not present

## 2022-05-18 DIAGNOSIS — K219 Gastro-esophageal reflux disease without esophagitis: Secondary | ICD-10-CM | POA: Diagnosis not present

## 2022-05-18 DIAGNOSIS — J449 Chronic obstructive pulmonary disease, unspecified: Secondary | ICD-10-CM | POA: Diagnosis not present

## 2022-05-18 DIAGNOSIS — E7800 Pure hypercholesterolemia, unspecified: Secondary | ICD-10-CM | POA: Diagnosis not present

## 2022-05-18 DIAGNOSIS — I10 Essential (primary) hypertension: Secondary | ICD-10-CM | POA: Diagnosis not present

## 2022-05-22 ENCOUNTER — Ambulatory Visit
Admission: RE | Admit: 2022-05-22 | Discharge: 2022-05-22 | Disposition: A | Discharge status: Home / Self Care | Payer: PPO | Source: Ambulatory Visit | Attending: Orthopedic Surgery | Admitting: Orthopedic Surgery

## 2022-05-22 DIAGNOSIS — R2232 Localized swelling, mass and lump, left upper limb: Secondary | ICD-10-CM

## 2022-05-22 DIAGNOSIS — M67472 Ganglion, left ankle and foot: Secondary | ICD-10-CM | POA: Diagnosis not present

## 2022-05-25 DIAGNOSIS — R2232 Localized swelling, mass and lump, left upper limb: Secondary | ICD-10-CM | POA: Diagnosis not present

## 2022-06-03 ENCOUNTER — Other Ambulatory Visit: Payer: PPO | Admitting: *Deleted

## 2022-06-03 DIAGNOSIS — E785 Hyperlipidemia, unspecified: Secondary | ICD-10-CM | POA: Diagnosis not present

## 2022-06-04 LAB — LIPID PANEL
Chol/HDL Ratio: 3.4 ratio (ref 0.0–4.4)
Cholesterol, Total: 130 mg/dL (ref 100–199)
HDL: 38 mg/dL — ABNORMAL LOW (ref >39)
LDL Chol Calc (NIH): 72 mg/dL (ref 0–99)
Triglycerides: 105 mg/dL (ref 0–149)
VLDL Cholesterol Cal: 20 mg/dL (ref 5–40)

## 2022-06-04 LAB — HEPATIC FUNCTION PANEL
ALT: 29 IU/L (ref 0–32)
AST: 26 IU/L (ref 0–40)
Albumin: 4.5 g/dL (ref 3.8–4.8)
Alkaline Phosphatase: 105 IU/L (ref 44–121)
Bilirubin Total: 0.3 mg/dL (ref 0.0–1.2)
Bilirubin, Direct: <0.1 mg/dL (ref 0.00–0.40)
Total Protein: 7 g/dL (ref 6.0–8.5)

## 2022-06-05 ENCOUNTER — Other Ambulatory Visit: Payer: Self-pay

## 2022-06-05 DIAGNOSIS — E785 Hyperlipidemia, unspecified: Secondary | ICD-10-CM

## 2022-06-05 MED ORDER — EZETIMIBE 10 MG PO TABS: 10.0000 mg | ORAL_TABLET | Freq: Every day | ORAL | 3 refills | Status: DC

## 2022-06-08 ENCOUNTER — Ambulatory Visit
Admission: RE | Admit: 2022-06-08 | Discharge: 2022-06-08 | Disposition: A | Discharge status: Home / Self Care | Payer: PPO | Source: Ambulatory Visit | Attending: Pulmonary Disease | Admitting: Pulmonary Disease

## 2022-06-08 DIAGNOSIS — R911 Solitary pulmonary nodule: Secondary | ICD-10-CM | POA: Diagnosis not present

## 2022-06-08 DIAGNOSIS — I712 Thoracic aortic aneurysm, without rupture, unspecified: Secondary | ICD-10-CM | POA: Diagnosis not present

## 2022-06-08 DIAGNOSIS — J439 Emphysema, unspecified: Secondary | ICD-10-CM | POA: Diagnosis not present

## 2022-06-11 ENCOUNTER — Other Ambulatory Visit: Payer: Self-pay | Admitting: *Deleted

## 2022-06-11 DIAGNOSIS — R911 Solitary pulmonary nodule: Secondary | ICD-10-CM

## 2022-06-11 MED ORDER — AMOXICILLIN-POT CLAVULANATE 875-125 MG PO TABS
1.0000 | ORAL_TABLET | Freq: Two times a day (BID) | ORAL | 0 refills | Status: DC
Start: 2022-06-11 — End: 2022-08-25

## 2022-06-11 NOTE — Progress Notes (Signed)
     [address]       [place] [address]             [phone]       Patient: Home Provider: Office Consent for Telemedicine visit obtained.  Today's visit was completed via a real-time telehealth (see specific modality noted below). The patient/authorized person provided oral consent at the time of the visit to engage in a telemedicine encounter with the present provider at [facility]. The patient/authorized person was informed of the potential benefits, limitations, and risks of telemedicine. The patient/authorized person expressed understanding that the laws that protect confidentiality also apply to telemedicine. The patient/authorized person acknowledged understanding that telemedicine does not provide emergency services and that he or she would need to call 911 or proceed to the nearest hospital for help if such a need arose.   Total time spent in the clinical discussion 10 minutes.  Telehealth Modality: Phone visit (audio only)  I had a telephone visit with [name] [name] Assessment / Plan:   66yo female with a 4.5 cm ascending aortic aneurysm.  Echocardiogram shows a tricuspid valve without evidence of regurgitation.  We discussed the natural history and and risk factors for growth of ascending aortic aneurysms.  We covered the importance of smoking cessation, tight blood pressure control, refraining from lifting heavy objects, and avoiding fluoroquinolones.  The patient is aware of signs and symptoms of aortic dissection and when to present to the emergency department.  We will continue surveillance and a repeat CT was ordered for 6 months.  She does have a family hx of aneurysm repair in her father in 2012.  Will recommend elective repair at 5cm, however may be prohibitive from a respiratory standpoint due to her severe COPD and bullous emphysema.  She is currently being treated by Dr. Charise Carwin.  Will coordinate surveillance CT Chests in the future.   Pulmonary nodules  followed by Dr. Charise Carwin.

## 2022-06-12 ENCOUNTER — Ambulatory Visit (INDEPENDENT_AMBULATORY_CARE_PROVIDER_SITE_OTHER): Payer: PPO | Admitting: Thoracic Surgery (Cardiothoracic Vascular Surgery)

## 2022-06-12 DIAGNOSIS — I7121 Aneurysm of the ascending aorta, without rupture: Secondary | ICD-10-CM | POA: Diagnosis not present

## 2022-06-15 ENCOUNTER — Ambulatory Visit: Payer: PPO | Admitting: Pulmonary Disease

## 2022-06-17 ENCOUNTER — Ambulatory Visit: Payer: PPO | Admitting: Pulmonary Disease

## 2022-06-18 ENCOUNTER — Ambulatory Visit: Payer: PPO | Admitting: Pulmonary Disease

## 2022-06-18 ENCOUNTER — Encounter: Payer: Self-pay | Admitting: Pulmonary Disease

## 2022-06-18 VITALS — BP 130/70 | HR 68 | Ht 67.0 in | Wt 200.0 lb

## 2022-06-18 DIAGNOSIS — R911 Solitary pulmonary nodule: Secondary | ICD-10-CM

## 2022-06-18 DIAGNOSIS — J449 Chronic obstructive pulmonary disease, unspecified: Secondary | ICD-10-CM | POA: Diagnosis not present

## 2022-06-18 NOTE — Progress Notes (Signed)
[name] [name]    [id number]    05-Dec-1955  Primary Care Physician:Stoneking, Christiane Ha, MD  Referring Physician: Lajean Manes, MD [address] [place],  [place] [address]  Chief complaint: Follow-up for COPD  HPI: 66 year old with history of COPD.  Previously followed by [name] [name].  Has not been seen PCCM since 2013 Maintained on Spiriva.  Advair was started in June 2021 Complains of chronic dyspnea on exertion.  Occasional cough, no fevers or chills Other medical issues include allergies with postnasal drip, GERD for which she is on Prilosec twice daily.  She has occasional hoarseness of voice Evaluated by Dr. Valetta [name] ENT in April 2021 with laryngoscope showing vocal cord edema consistent with LPR  History notable for thoracic aortic aneurysm which was noted during follow-up for lung nodules.  The aneurysm is slowly increasing in size.  She has been evaluated by cardiothoracic surgery with continued surveillance recommended.  Lung nodules have subsequently resolved on follow-up CT scans  Finished pulmonary rehab but coronary in 2022 Notes desats to 85% on exertion and has been getting supplemental oxygen  Pets: No pets Occupation: Retired Customer service manager Exposures: No mold, hot tub, Customer service manager.  No feather pillows or comforter Smoking history: 30-pack-year smoker.  Quit in 2007 Travel history: No significant travel history Relevant family history: No family history of lung disease  Interim history:  Continues on Trelegy for COPD.  States that breathing is doing well She had a CT scan recently that showed some waxing waning inflammatory changes and has been started on Augmentin Has some chronic cough with white mucus.   Outpatient Encounter Medications as of 06/18/2022  Medication Sig   albuterol (VENTOLIN HFA) 108 (90 Base) MCG/ACT inhaler Inhale 2 puffs into the lungs every 4 (four) hours as needed for wheezing or shortness of breath.   amLODipine (NORVASC) 2.5 MG  tablet Take 2.5 mg by mouth daily.   amoxicillin-clavulanate (AUGMENTIN) 875-125 MG tablet Take 1 tablet by mouth 2 (two) times daily.   aspirin EC 81 MG tablet Take 81 mg by mouth daily. Swallow whole.   cholecalciferol (VITAMIN D) 1000 units tablet Take 1,000 Units by mouth daily.   ezetimibe (ZETIA) 10 MG tablet Take 1 tablet (10 mg total) by mouth daily.   Fluticasone-Umeclidin-Vilant (TRELEGY ELLIPTA) 100-62.5-25 MCG/ACT AEPB    losartan (COZAAR) 25 MG tablet Take 25 mg by mouth daily.   Multiple Vitamin (MULTIVITAMIN) tablet Take 1 tablet by mouth daily.   omeprazole (PRILOSEC) 20 MG capsule Take 20 mg by mouth 2 (two) times daily.   rosuvastatin (CRESTOR) 40 MG tablet Take 1 tablet (40 mg total) by mouth daily.   SYNTHROID 100 MCG tablet Take 100 mcg by mouth daily.   No facility-administered encounter medications on file as of 06/18/2022.    Physical Exam: Blood pressure 130/70, pulse 68, height '5\' 7"'$  (1.702 m), weight 200 lb (90.7 kg), SpO2 98 %. Gen:      No acute distress HEENT:  EOMI, sclera anicteric Neck:     No masses; no thyromegaly Lungs:    Clear to auscultation bilaterally; normal respiratory effort CV:         Regular rate and rhythm; no murmurs Abd:      + bowel sounds; soft, non-tender; no palpable masses, no distension Ext:    No edema; adequate peripheral perfusion Skin:      Warm and dry; no rash Neuro: alert and oriented x 3 Psych: normal mood  and affect   Data Reviewed: Imaging: CT chest 08/21/2021-stable thoracic aortic aneurysm measuring 4.7 cm, mild coronary artery calcification, severe emphysema.    CT chest 06/08/2022-waxing and waning areas of nodularity bilaterally, 4.5 cm ascending aortic aneurysm. I have reviewed the images personally.  PFTs: 08/16/2012 FVC 3.71 [96%], FEV1 1.76 [50%], F/F 48, TLC 7.09 [125%], DLCO 17.75 [59%]   12/12/2021 FVC 2.94 [81%], FEV1 1.39 [50%], F/F 47, TLC 7.23 [127%], DLCO 13.74 [61%] Severe obstruction with  hyperinflation, air trapping.  Moderate diffusion impairment  Labs: CBC 05/20/2021-WBC 8.5, eos 7%, absolute eosinophil count 595  Assessment:  Severe COPD, asthma overlap syndrome May have asthmatic component of COPD given elevated peripheral eosinophils in the past.  Doing well on Trelegy.  No desats on exertion today Her symptoms are stable and we will continue current therapy if there are more frequent exacerbations and she may be a good candidate for biologic  Lung nodules Has some inflammatory nodules on CT scan.  Currently getting Augmentin Follow-up CT in 6 months  Thoracic aortic aneurysm Follow-up on CT chest  Cirrhosis Noted on CT scan.  She has a follow-up with her primary care tomorrow where she will discuss these findings.  Plan/Recommendations: Continue trelegy Follow-up CT in 3 months.  [name] Garfinkel MD [facility] 06/18/2022, 9:17 AM  CC: [name] Manes, MD

## 2022-06-18 NOTE — Patient Instructions (Signed)
We have started you on antibiotics and we will get a follow-up CT scan in 3 months Continue Trelegy inhaler Return to clinic in 6 months.

## 2022-06-19 DIAGNOSIS — Z79899 Other long term (current) drug therapy: Secondary | ICD-10-CM | POA: Diagnosis not present

## 2022-06-19 DIAGNOSIS — E7800 Pure hypercholesterolemia, unspecified: Secondary | ICD-10-CM | POA: Diagnosis not present

## 2022-06-19 DIAGNOSIS — J449 Chronic obstructive pulmonary disease, unspecified: Secondary | ICD-10-CM | POA: Diagnosis not present

## 2022-06-19 DIAGNOSIS — K7469 Other cirrhosis of liver: Secondary | ICD-10-CM | POA: Diagnosis not present

## 2022-06-29 DIAGNOSIS — K7469 Other cirrhosis of liver: Secondary | ICD-10-CM | POA: Diagnosis not present

## 2022-07-16 ENCOUNTER — Telehealth: Payer: Self-pay | Admitting: Internal Medicine

## 2022-07-16 NOTE — Telephone Encounter (Signed)
New Message:    Please call  Kaitlyn. She needs to go over patient's medicine and make sure there are no changes.

## 2022-07-17 NOTE — Telephone Encounter (Signed)
Spoke w [name] [name].  She was checking that no further changes to cholesterol medication have been made since those made in May.  Nothing further needed.

## 2022-08-10 ENCOUNTER — Ambulatory Visit: Payer: PPO | Attending: Internal Medicine

## 2022-08-10 DIAGNOSIS — E785 Hyperlipidemia, unspecified: Secondary | ICD-10-CM

## 2022-08-11 ENCOUNTER — Encounter: Payer: Self-pay | Admitting: Internal Medicine

## 2022-08-11 ENCOUNTER — Telehealth: Payer: Self-pay

## 2022-08-11 DIAGNOSIS — E785 Hyperlipidemia, unspecified: Secondary | ICD-10-CM

## 2022-08-11 DIAGNOSIS — E7841 Elevated Lipoprotein(a): Secondary | ICD-10-CM

## 2022-08-11 LAB — LIPID PANEL
Chol/HDL Ratio: 2.3 ratio (ref 0.0–4.4)
Cholesterol, Total: 108 mg/dL (ref 100–199)
HDL: 47 mg/dL (ref >39)
LDL Chol Calc (NIH): 47 mg/dL (ref 0–99)
Triglycerides: 66 mg/dL (ref 0–149)
VLDL Cholesterol Cal: 14 mg/dL (ref 5–40)

## 2022-08-11 NOTE — Telephone Encounter (Signed)
Referral already placed and pt aware she will be called to schedule.

## 2022-08-11 NOTE — Telephone Encounter (Signed)
Early Osmond, MD  08/11/2022  8:30 AM EDT Back to Top    Have patient see Dr. [name] regarding elevated LP(a)

## 2022-08-11 NOTE — Telephone Encounter (Signed)
error 

## 2022-08-11 NOTE — Telephone Encounter (Signed)
-----   Message from Early Osmond, MD sent at 08/11/2022  8:30 AM EDT ----- Have patient see Dr. [name] regarding elevated LP(a)

## 2022-08-22 ENCOUNTER — Encounter: Payer: Self-pay | Admitting: Pulmonary Disease

## 2022-08-24 NOTE — Progress Notes (Unsigned)
History of Present Illness [name] [name] is a 66 y.o. female former smoker ( 30 pack year smoking history, quit 2007) with history of COPD .  Previously followed by Dr. Melvyn Novas.  Had not been seen by [facility] since 2013 when she was referred back and was seen by Dr. Vaughan Browner 11/2021 at the request of her PCP.  .  Maintenance Trelegy since 11/2021   08/25/2022 Pt. Presents for an acute visit. She was last seen by Dr. Vaughan Browner on 12/12/2021 . At that time he was planning on doing a qualifying walk for oxygen at her 6 month follow up, as she had saturation drops into the 85% range. She was hoping to delay oxygen use. She did not have a 6 month follow up as was recommended by Dr. Vaughan Browner . Today patient states she has and worsening shortness of breath x 4 weeks. Per the patient her secretions are clear . But significant increase in mucus production especially in the morning. She denies any fever.She is hoarse. She is having to use her albuterol rescue twice daily. Shortness of breath with exertion is worse. She denies wheeze or cough. She did have her flu and Covid vaccine. She is planning on getting RSV in the next 2 weeks .  Pt. Has a follow up CT Chest scheduled for 09/14/2022 as follow up to pulmonary. We will treat for a flare and ensure her CT Chest is clear.  Test Results: Imaging: CT chest 08/21/2021-stable thoracic aortic aneurysm measuring 4.7 cm, mild coronary artery calcification, severe emphysema.      PFTs: 08/16/2012 FVC 3.71 [96%], FEV1 1.76 [50%], F/F 48, TLC 7.09 [125%], DLCO 17.75 [59%]     12/12/2021 FVC 2.94 [81%], FEV1 1.39 [50%], F/F 47, TLC 7.23 [127%], DLCO 13.74 [61%] Severe obstruction with hyperinflation, air trapping.  Moderate diffusion impairment   Labs: CBC 05/20/2021-WBC 8.5, eos 7%, absolute eosinophil count 595       Latest Ref Rng & Units 05/20/2021    1:12 PM  CBC  WBC 4.0 - 10.5 K/uL 8.5   Hemoglobin 12.0 - 15.0 g/dL 12.6   Hematocrit 36.0 - 46.0 % 38.5    Platelets 150 - 400 K/uL 316        Latest Ref Rng & Units 05/20/2021    1:12 PM  BMP  Glucose 70 - 99 mg/dL 95   BUN 8 - 23 mg/dL 11   Creatinine 0.44 - 1.00 mg/dL 0.74   Sodium 135 - 145 mmol/L 137   Potassium 3.5 - 5.1 mmol/L 3.5   Chloride 98 - 111 mmol/L 102   CO2 22 - 32 mmol/L 26   Calcium 8.9 - 10.3 mg/dL 8.9     BNP    Component Value Date/Time   BNP 77.9 05/20/2021 1312    ProBNP No results found for: "PROBNP"  PFT    Component Value Date/Time   FEV1PRE 1.26 12/12/2021 0840   FEV1POST 1.39 12/12/2021 0840   FVCPRE 2.70 12/12/2021 0840   FVCPOST 2.94 12/12/2021 0840   TLC 7.23 12/12/2021 0840   DLCOUNC 13.74 12/12/2021 0840   PREFEV1FVCRT 46 12/12/2021 0840   PSTFEV1FVCRT 47 12/12/2021 0840    No results found.   Past medical hx Past Medical History:  Diagnosis Date   Arthritis    right knee, left hip   Carpal tunnel syndrome of left wrist 05/2017   COPD, severe (HCC)    SOB with ADLs per pt. - no O2   Cubital tunnel  syndrome on left 05/2017   Dental crowns present    also dental implants   Esophageal reflux    Family history of adverse reaction to anesthesia    pt's mother has hx. of agitation and hallucinations post-op   History of asthma    as a child   Hypothyroidism (acquired)    Tachycardia    Holter monitor 06/08/2017 - 06/10/2017:  does not have results yet     Social History   Tobacco Use   Smoking status: Former    Packs/day: 0.00    Years: 30.00    Total pack years: 0.00    Types: Cigarettes    Start date: 12/01/2003    Quit date: 01/06/2013    Years since quitting: 9.6   Smokeless tobacco: Never  Vaping Use   Vaping Use: Never used  Substance Use Topics   Alcohol use: Yes    Comment: 2 beers per week   Drug use: No    [name] reports that she quit smoking about 9 years ago. Her smoking use included cigarettes. She started smoking about 18 years ago. She has never used smokeless tobacco. She reports current alcohol  use. She reports that she does not use drugs.  Tobacco Cessation: Former smoker with a 30 pack year smoking history , quit 2007.    Past surgical hx, Family hx, Social hx all reviewed.  Current Outpatient Medications on File Prior to Visit  Medication Sig   albuterol (VENTOLIN HFA) 108 (90 Base) MCG/ACT inhaler Inhale 2 puffs into the lungs every 4 (four) hours as needed for wheezing or shortness of breath.   amLODipine (NORVASC) 2.5 MG tablet Take 2.5 mg by mouth daily.   aspirin EC 81 MG tablet Take 81 mg by mouth daily. Swallow whole.   cholecalciferol (VITAMIN D) 1000 units tablet Take 1,000 Units by mouth daily.   ezetimibe (ZETIA) 10 MG tablet Take 1 tablet (10 mg total) by mouth daily.   Fluticasone-Umeclidin-Vilant (TRELEGY ELLIPTA) 100-62.5-25 MCG/ACT AEPB    losartan (COZAAR) 25 MG tablet Take 25 mg by mouth daily.   Multiple Vitamin (MULTIVITAMIN) tablet Take 1 tablet by mouth daily.   omeprazole (PRILOSEC) 20 MG capsule Take 20 mg by mouth 2 (two) times daily.   rosuvastatin (CRESTOR) 40 MG tablet Take 1 tablet (40 mg total) by mouth daily.   SYNTHROID 100 MCG tablet Take 100 mcg by mouth daily.   No current facility-administered medications on file prior to visit.     Allergies  Allergen Reactions   Peanut-Containing Drug Products Shortness Of Breath    [place] NUTS   Codeine Other (See Comments)    UNKNOWN   Other Other (See Comments)    Review Of Systems:  Constitutional:   No  weight loss, night sweats,  Fevers, chills, fatigue, or  lassitude.  HEENT:   No headaches,  Difficulty swallowing,  Tooth/dental problems, or  Sore throat,                No sneezing, itching, ear ache, nasal congestion, post nasal drip,   CV:  No chest pain,  Orthopnea, PND, swelling in lower extremities, anasarca, dizziness, palpitations, syncope.   GI  No heartburn, indigestion, abdominal pain, nausea, vomiting, diarrhea, change in bowel habits, loss of appetite, bloody stools.    Resp: + shortness of breath with exertion less at rest.  + excess mucus, + productive cough,  No non-productive cough,  No coughing up of blood.  No change in color  of mucus.  No wheezing.  No chest wall deformity  Skin: no rash or lesions.  GU: no dysuria, change in color of urine, no urgency or frequency.  No flank pain, no hematuria   MS:  No joint pain or swelling.  No decreased range of motion.  No back pain.  Psych:  No change in mood or affect. No depression or anxiety.  No memory loss.   Vital Signs BP 110/70 (BP Location: Left Arm, Cuff Size: Normal)   Pulse 83   Temp 97.7 F (36.5 C)   Ht '5\' 7"'$  (1.702 m)   Wt 197 lb (89.4 kg)   SpO2 96%   BMI 30.85 kg/m    Physical Exam:  General- No distress,  A&Ox3, pleasant ENT: No sinus tenderness, TM clear, pale nasal mucosa, no oral exudate,no post nasal drip, no LAN Cardiac: S1, S2, regular rate and rhythm, no murmur Chest: No wheeze/ rales/ dullness; no accessory muscle use, no nasal flaring, no sternal retractions Abd.: Soft Non-tender, ND, BS +, Body mass index is 30.85 kg/m.  Ext: No clubbing cyanosis, edema Neuro:  normal strength, MAE x 4, A&O x 3 Skin: No rashes, warm and dry, no lesions  Psych: normal mood and behavior   Assessment/Plan COPD Flare Plan  AAA  Plan Continue Q 6 month CT's to AAA  COPD Flare Plan We will treat you for a COPD Flare. We will send in Doxycycline 100 mg twice daily x 4 days. Sun Block if you are in the sun.  Prednisone taper; 10 mg tablets:  3 tabs x 2 days, 2 tabs x 2 days 1 tab x 2 days then stop.  Note your daily symptoms > remember "red flags" for COPD:  Increase in cough, increase in sputum production, increase in shortness of breath or activity intolerance. If you notice these symptoms, please call to be seen.    If you get no better , call and let us know.  Follow up in 1 week with Judson Roch NP. Continue Trelegy daily as you have been doing.  Rinse mouth after use We  will give you a sample today to get you through the donut hole you are experiencing. CT Scan 09/14/2022 at 11 am  Please contact office for sooner follow up if symptoms do not improve or worsen or seek emergency care   I spent 40 minutes dedicated to the care of this patient on the date of this encounter to include pre-visit review of records, face-to-face time with the patient discussing conditions above, post visit ordering of testing, clinical documentation with the electronic health record, making appropriate referrals as documented, and communicating necessary information to the patient's healthcare team.    Magdalen Spatz, NP 08/25/2022  12:57 PM

## 2022-08-24 NOTE — Telephone Encounter (Signed)
Called and spoke with pt who states she has had increased SOB and coughing up clear phlegm for the past 4 weeks. Denies any complaints of wheezing.  Pt has had some chest discomfort. Denies any complaints of fever.  States her cough is worse in the mornings as she has a lot of phlegm production. States that she is using her Trelegy inhaler as prescribed. Pt has been having to use her albuterol inhaler 1-2 times daily within the past 3 weeks.  Stated to pt that we should get her in for an appt and she verbalized understanding. Appt scheduled for pt with SG 9/26 at 9am.

## 2022-08-25 ENCOUNTER — Ambulatory Visit: Payer: PPO | Admitting: Acute Care

## 2022-08-25 ENCOUNTER — Encounter: Payer: Self-pay | Admitting: Acute Care

## 2022-08-25 VITALS — BP 110/70 | HR 83 | Temp 97.7°F | Ht 67.0 in | Wt 197.0 lb

## 2022-08-25 DIAGNOSIS — J441 Chronic obstructive pulmonary disease with (acute) exacerbation: Secondary | ICD-10-CM

## 2022-08-25 DIAGNOSIS — Z87891 Personal history of nicotine dependence: Secondary | ICD-10-CM

## 2022-08-25 MED ORDER — TRELEGY ELLIPTA 100-62.5-25 MCG/ACT IN AEPB: 1.0000 | INHALATION_SPRAY | Freq: Every day | RESPIRATORY_TRACT | 0 refills | Status: DC

## 2022-08-25 MED ORDER — PREDNISONE 10 MG PO TABS: ORAL_TABLET | ORAL | 0 refills | Status: DC

## 2022-08-25 MED ORDER — DOXYCYCLINE HYCLATE 100 MG PO TABS: 100.0000 mg | ORAL_TABLET | Freq: Two times a day (BID) | ORAL | 0 refills | Status: AC

## 2022-08-25 NOTE — Patient Instructions (Addendum)
It is good to see you today. We will treat you for a COPD Flare. We will send in Doxycycline 100 mg twice daily x 4 days. Sun Block if you are in the sun.  Prednisone taper; 10 mg tablets:  3 tabs x 2 days, 2 tabs x 2 days 1 tab x 2 days then stop.  Note your daily symptoms > remember "red flags" for COPD:  Increase in cough, increase in sputum production, increase in shortness of breath or activity intolerance. If you notice these symptoms, please call to be seen.    If you get no better , call and let us know.  Follow up in 1 week with Judson Roch NP. Continue Trelegy daily as you have been doing.  Rinse mouth after use We will give you a sample today to get you through the donut hole you are experiencing. CT Scan 09/14/2022 at 11 am  Please contact office for sooner follow up if symptoms do not improve or worsen or seek emergency care

## 2022-08-26 DIAGNOSIS — M8580 Other specified disorders of bone density and structure, unspecified site: Secondary | ICD-10-CM | POA: Diagnosis not present

## 2022-08-26 DIAGNOSIS — J449 Chronic obstructive pulmonary disease, unspecified: Secondary | ICD-10-CM | POA: Diagnosis not present

## 2022-08-26 DIAGNOSIS — K7469 Other cirrhosis of liver: Secondary | ICD-10-CM | POA: Diagnosis not present

## 2022-08-26 DIAGNOSIS — R911 Solitary pulmonary nodule: Secondary | ICD-10-CM | POA: Diagnosis not present

## 2022-08-26 DIAGNOSIS — I2584 Coronary atherosclerosis due to calcified coronary lesion: Secondary | ICD-10-CM | POA: Diagnosis not present

## 2022-08-26 DIAGNOSIS — E785 Hyperlipidemia, unspecified: Secondary | ICD-10-CM | POA: Diagnosis not present

## 2022-08-26 DIAGNOSIS — I700 Atherosclerosis of aorta: Secondary | ICD-10-CM | POA: Diagnosis not present

## 2022-08-26 DIAGNOSIS — E890 Postprocedural hypothyroidism: Secondary | ICD-10-CM | POA: Diagnosis not present

## 2022-08-26 DIAGNOSIS — I10 Essential (primary) hypertension: Secondary | ICD-10-CM | POA: Diagnosis not present

## 2022-09-01 ENCOUNTER — Encounter: Payer: Self-pay | Admitting: Acute Care

## 2022-09-01 ENCOUNTER — Ambulatory Visit: Payer: PPO | Admitting: Acute Care

## 2022-09-01 DIAGNOSIS — J441 Chronic obstructive pulmonary disease with (acute) exacerbation: Secondary | ICD-10-CM

## 2022-09-01 MED ORDER — PREDNISONE 10 MG PO TABS: ORAL_TABLET | ORAL | 0 refills | Status: DC

## 2022-09-01 NOTE — Patient Instructions (Addendum)
It is good to see you today We will re-treat with another prednisone taper Prednisone taper; 10 mg tablets: 4 tabs x 2 days, 3 tabs x 2 days, 2 tabs x 2 days 1 tab x 2 days then stop.  Note your daily symptoms > remember "red flags" for COPD:  Increase in cough, increase in sputum production, increase in shortness of breath or activity intolerance. If you notice these symptoms, please call to be seen.    If you get no better , call and let us know.  Continue Trelegy daily as you have been doing.  Rinse mouth after use Use rescue as needed for breakthrough shortness of breath or wheezing CT Chest as is scheduled 09/14/22. Follow up with Judson Roch NP in 1 month. Please contact office for sooner follow up if symptoms do not improve or worsen or seek emergency care

## 2022-09-01 NOTE — Progress Notes (Signed)
History of Present Illness [name] [name] is a 66 y.o. female  former smoker ( 30 pack year smoking history, quit 2007) with history of COPD .Previously followed by Dr. Melvyn Novas.  Had not been seen by [facility] since 2013 when she was referred back and was seen by Dr. Vaughan Browner 11/2021 at the request of her PCP.  Maintenance : Trelegy Rescue:  Albuterol as needed for shortness of breath or wheezing   09/01/2022 Pt. Returns for follow up after acute visit 08/25/2022 for COPD Flare. She was treated with Doxycycline BID x 7 days, and a prednisone taper. She states she feels better. She states her secretions have gone. She still has some shortness of breath. She states her shortness of breath did get better on the prednisone. She has a CT Chest 10/16 to evaluate for AAA. We will evaluate this once it is done.  She denies fever, chest pain, orthopnea or hemoptysis. She states she uses her rescue inhaler about 2 times a week. She is compliant with her Trelegy  Test Results: CT chest 08/21/2021-stable thoracic aortic aneurysm measuring 4.7 cm, mild coronary artery calcification, severe emphysema.       PFTs: 08/16/2012 FVC 3.71 [96%], FEV1 1.76 [50%], F/F 48, TLC 7.09 [125%], DLCO 17.75 [59%]     12/12/2021 FVC 2.94 [81%], FEV1 1.39 [50%], F/F 47, TLC 7.23 [127%], DLCO 13.74 [61%] Severe obstruction with hyperinflation, air trapping.  Moderate diffusion impairment   Labs: CBC 05/20/2021-WBC 8.5, eos 7%, absolute eosinophil count 595       Latest Ref Rng & Units 05/20/2021    1:12 PM  CBC  WBC 4.0 - 10.5 K/uL 8.5   Hemoglobin 12.0 - 15.0 g/dL 12.6   Hematocrit 36.0 - 46.0 % 38.5   Platelets 150 - 400 K/uL 316        Latest Ref Rng & Units 05/20/2021    1:12 PM  BMP  Glucose 70 - 99 mg/dL 95   BUN 8 - 23 mg/dL 11   Creatinine 0.44 - 1.00 mg/dL 0.74   Sodium 135 - 145 mmol/L 137   Potassium 3.5 - 5.1 mmol/L 3.5   Chloride 98 - 111 mmol/L 102   CO2 22 - 32 mmol/L 26   Calcium 8.9 - 10.3 mg/dL  8.9     BNP    Component Value Date/Time   BNP 77.9 05/20/2021 1312    ProBNP No results found for: "PROBNP"  PFT    Component Value Date/Time   FEV1PRE 1.26 12/12/2021 0840   FEV1POST 1.39 12/12/2021 0840   FVCPRE 2.70 12/12/2021 0840   FVCPOST 2.94 12/12/2021 0840   TLC 7.23 12/12/2021 0840   DLCOUNC 13.74 12/12/2021 0840   PREFEV1FVCRT 46 12/12/2021 0840   PSTFEV1FVCRT 47 12/12/2021 0840    No results found.   Past medical hx Past Medical History:  Diagnosis Date   Arthritis    right knee, left hip   Carpal tunnel syndrome of left wrist 05/2017   COPD, severe (HCC)    SOB with ADLs per pt. - no O2   Cubital tunnel syndrome on left 05/2017   Dental crowns present    also dental implants   Esophageal reflux    Family history of adverse reaction to anesthesia    pt's mother has hx. of agitation and hallucinations post-op   History of asthma    as a child   Hypothyroidism (acquired)    Tachycardia    Holter monitor 06/08/2017 - 06/10/2017:  does not have results yet     Social History   Tobacco Use   Smoking status: Former    Packs/day: 0.00    Years: 30.00    Total pack years: 0.00    Types: Cigarettes    Start date: 12/01/2003    Quit date: 01/06/2013    Years since quitting: 9.6   Smokeless tobacco: Never  Vaping Use   Vaping Use: Never used  Substance Use Topics   Alcohol use: Yes    Comment: 2 beers per week   Drug use: No    Ms.Lopresti reports that she quit smoking about 9 years ago. Her smoking use included cigarettes. She started smoking about 18 years ago. She has never used smokeless tobacco. She reports current alcohol use. She reports that she does not use drugs.  Tobacco Cessation: Former smoker , 30 pack year smoking history, Quit in 2007.    Past surgical hx, Family hx, Social hx all reviewed.  Current Outpatient Medications on File Prior to Visit  Medication Sig   albuterol (VENTOLIN HFA) 108 (90 Base) MCG/ACT inhaler Inhale 2  puffs into the lungs every 4 (four) hours as needed for wheezing or shortness of breath.   amLODipine (NORVASC) 2.5 MG tablet Take 2.5 mg by mouth daily.   aspirin EC 81 MG tablet Take 81 mg by mouth daily. Swallow whole.   cholecalciferol (VITAMIN D) 1000 units tablet Take 1,000 Units by mouth daily.   doxycycline (VIBRA-TABS) 100 MG tablet Take 1 tablet (100 mg total) by mouth 2 (two) times daily for 7 days.   ezetimibe (ZETIA) 10 MG tablet Take 1 tablet (10 mg total) by mouth daily.   Fluticasone-Umeclidin-Vilant (TRELEGY ELLIPTA) 100-62.5-25 MCG/ACT AEPB    Fluticasone-Umeclidin-Vilant (TRELEGY ELLIPTA) 100-62.5-25 MCG/ACT AEPB Inhale 1 puff into the lungs daily.   losartan (COZAAR) 25 MG tablet Take 25 mg by mouth daily.   Multiple Vitamin (MULTIVITAMIN) tablet Take 1 tablet by mouth daily.   omeprazole (PRILOSEC) 20 MG capsule Take 20 mg by mouth 2 (two) times daily.   rosuvastatin (CRESTOR) 40 MG tablet Take 1 tablet (40 mg total) by mouth daily.   SYNTHROID 100 MCG tablet Take 100 mcg by mouth daily.   No current facility-administered medications on file prior to visit.     Allergies  Allergen Reactions   Peanut-Containing Drug Products Shortness Of Breath    [place] NUTS   Codeine Other (See Comments)    UNKNOWN   Other Other (See Comments)    Review Of Systems:  Constitutional:   No  weight loss, night sweats,  Fevers, chills, fatigue, or  lassitude.  HEENT:   No headaches,  Difficulty swallowing,  Tooth/dental problems, or  Sore throat,                No sneezing, itching, ear ache, nasal congestion, post nasal drip,   CV:  No chest pain,  Orthopnea, PND, swelling in lower extremities, anasarca, dizziness, palpitations, syncope.   GI  No heartburn, indigestion, abdominal pain, nausea, vomiting, diarrhea, change in bowel habits, loss of appetite, bloody stools.   Resp: + shortness of breath with exertion less at rest.  No excess mucus, no productive cough,  No  non-productive cough,  No coughing up of blood.  No change in color of mucus.  No wheezing.  No chest wall deformity  Skin: no rash or lesions.  GU: no dysuria, change in color of urine, no urgency or frequency.  No flank pain,  no hematuria   MS:  No joint pain or swelling.  No decreased range of motion.  No back pain.  Psych:  No change in mood or affect. No depression or anxiety.  No memory loss.   Vital Signs BP 116/70 (BP Location: Left Arm, Patient Position: Sitting, Cuff Size: Normal)   Pulse (!) 58   Temp 97.8 F (36.6 C) (Oral)   Ht 5' 7.5" (1.715 m)   Wt 198 lb 6.4 oz (90 kg)   SpO2 99%   BMI 30.62 kg/m    Physical Exam:  General- No distress,  A&Ox3, Appropriate ENT: No sinus tenderness, TM clear, pale nasal mucosa, no oral exudate,no post nasal drip, no LAN Cardiac: S1, S2, regular rate and rhythm, no murmur Chest: No wheeze/ rales/ dullness; no accessory muscle use, no nasal flaring, no sternal retractions Abd.: Soft Non-tender, ND,. BS +, Body mass index is 30.62 kg/m.  Ext: No clubbing cyanosis, edema Neuro:  normal strength, MAE x 4, A&O x 3 Skin: No rashes, warm and dry, no lesions  Psych: normal mood and behavior   Assessment/Plan Slow to Resolve COPD exacerbation Plan We will re-treat with another prednisone taper Prednisone taper; 10 mg tablets: 4 tabs x 2 days, 3 tabs x 2 days, 2 tabs x 2 days 1 tab x 2 days then stop.  Note your daily symptoms > remember "red flags" for COPD:  Increase in cough, increase in sputum production, increase in shortness of breath or activity intolerance. If you notice these symptoms, please call to be seen.   Follow up with Judson Roch NP in 1 month.  If you get no better , call and let us know.  Continue Trelegy daily as you have been doing.  Rinse mouth after use Use rescue as needed.   AAA  Plan Continue Q 6 month CT's to AAA Due 09/14/2022  I spent 35 minutes dedicated to the care of this patient on the date of this  encounter to include pre-visit review of records, face-to-face time with the patient discussing conditions above, post visit ordering of testing, clinical documentation with the electronic health record, making appropriate referrals as documented, and communicating necessary information to the patient's healthcare team.     Magdalen Spatz, NP 09/01/2022  9:33 AM

## 2022-09-09 ENCOUNTER — Inpatient Hospital Stay: Admission: RE | Admit: 2022-09-09 | Payer: PPO | Source: Ambulatory Visit

## 2022-09-14 ENCOUNTER — Ambulatory Visit
Admission: RE | Admit: 2022-09-14 | Discharge: 2022-09-14 | Disposition: A | Discharge status: Home / Self Care | Payer: PPO | Source: Ambulatory Visit | Attending: Pulmonary Disease | Admitting: Pulmonary Disease

## 2022-09-14 DIAGNOSIS — R911 Solitary pulmonary nodule: Secondary | ICD-10-CM

## 2022-09-14 DIAGNOSIS — J439 Emphysema, unspecified: Secondary | ICD-10-CM | POA: Diagnosis not present

## 2022-09-14 DIAGNOSIS — I700 Atherosclerosis of aorta: Secondary | ICD-10-CM | POA: Diagnosis not present

## 2022-09-14 DIAGNOSIS — I7121 Aneurysm of the ascending aorta, without rupture: Secondary | ICD-10-CM | POA: Diagnosis not present

## 2022-09-18 ENCOUNTER — Ambulatory Visit (INDEPENDENT_AMBULATORY_CARE_PROVIDER_SITE_OTHER): Payer: PPO | Admitting: Thoracic Surgery (Cardiothoracic Vascular Surgery)

## 2022-09-18 DIAGNOSIS — I7121 Aneurysm of the ascending aorta, without rupture: Secondary | ICD-10-CM | POA: Diagnosis not present

## 2022-09-18 NOTE — Progress Notes (Signed)
[address]       [place] [address]             [phone]       Patient: Home Provider: Office Consent for Telemedicine visit obtained.  Today's visit was completed via a real-time telehealth (see specific modality noted below). The patient/authorized person provided oral consent at the time of the visit to engage in a telemedicine encounter with the present provider at [facility]. The patient/authorized person was informed of the potential benefits, limitations, and risks of telemedicine. The patient/authorized person expressed understanding that the laws that protect confidentiality also apply to telemedicine. The patient/authorized person acknowledged understanding that telemedicine does not provide emergency services and that he or she would need to call 911 or proceed to the nearest hospital for help if such a need arose.   Total time spent in the clinical discussion 10 minutes.  Telehealth Modality: Phone visit (audio only)  Recent Radiology Findings:   CT Super D Chest Wo Contrast  Result Date: 09/15/2022 CLINICAL DATA:  Lung nodule.  History of thoracic aortic aneurysm. EXAM: CT CHEST WITHOUT CONTRAST TECHNIQUE: Multidetector CT imaging of the chest was performed using thin slice collimation for electromagnetic bronchoscopy planning purposes, without intravenous contrast. RADIATION DOSE REDUCTION: This exam was performed according to the departmental dose-optimization program which includes automated exposure control, adjustment of the mA and/or kV according to patient size and/or use of iterative reconstruction technique. COMPARISON:  06/08/2022 FINDINGS: Cardiovascular: Ascending thoracic aortic aneurysm 4.6 cm in diameter on image 78 series 2, stable by my measurements. Coronary, aortic arch, and branch vessel atherosclerotic vascular disease. Mediastinum/Nodes: Unremarkable Lungs/Pleura: Severe emphysema. Mild mucous plugging medially in the right lower lobe,  image 124 series 8. Volume loss partially contributing to a 3.0 by 1.2 cm right upper lobe nodule on image 60 series 8, formerly this measured 4.2 by 2.3 cm by my measurements. A band of density extends cephalad from this nodule to a region of pleural thickening along the left upper lobe posteriorly. Bandlike scarring in the apicoposterior segment left upper lobe. Minimal lingular scarring. Stable 7 by 4 mm superior segment left lower lobe nodule on image 76 series 8. This measured substantially larger on 03/02/2022. New cavitation of a left upper lobe nodule measuring 1.0 by 0.6 cm on image 45 of series 8, this nodule previously was measured at 2.9 by 1.5 cm and is thus substantially reduced in size from prior. Upper Abdomen: Slight nodularity of the hepatic contour raising the possibility of cirrhosis. Abdominal aortic atherosclerotic vascular calcification. No splenomegaly. Musculoskeletal: Mild thoracic spondylosis and kyphosis. IMPRESSION: 1. Reduced size of several of the prior pulmonary nodules/masses, including reduced size of the right upper lobe nodule currently measuring 3.0 by 1.2 cm, previously 4.2 by 2.3 cm. New cavitation of a left upper lobe nodule measuring 1.0 by 0.6 cm, previously 2.9 by 1.5 cm. 2. Stable 7 by 4 mm superior segment left lower lobe nodule. 3. Overall given the waxing and waning appearance of the nodules, atypical infection is most likely. Consider surveillance imaging in 6 months time. 4. Ascending thoracic aortic aneurysm 4.6 cm in diameter. Recommend semi-annual imaging followup by CTA or MRA and referral to cardiothoracic surgery if not already obtained. This recommendation follows 2010 ACCF/AHA/AATS/ACR/ASA/SCA/SCAI/SIR/STS/SVM Guidelines for the Diagnosis and Management of Patients With Thoracic Aortic Disease. Circulation. 2010; 121: T614-E315. Aortic aneurysm NOS (ICD10-I71.9) 5. Suspected hepatic cirrhosis. 6. Aortic and coronary atherosclerosis. Aortic Atherosclerosis  (ICD10-I70.0). Electronically Signed  By: Van Clines M.D.   On: 09/15/2022 09:47    I had a telephone visit with Mrs. Sutherland  Assessment:  66 year old female with a 4.6 cm ascending aortic aneurysm.  Echocardiogram shows a trileaflet valve without evidence of regurgitation.  We discussed the natural history and and risk factors for growth of ascending aortic aneurysms.  We covered the importance of smoking cessation, tight blood pressure control, refraining from lifting heavy objects, and avoiding fluoroquinolones.  The patient is aware of signs and symptoms of aortic dissection and when to present to the emergency department.  We will continue surveillance and a repeat CT was ordered for 12 months.  She does have a family hx of aneurysm repair in her father in 2012.  Will recommend elective repair at 5cm, however may be prohibitive from a respiratory standpoint due to her severe COPD and bullous emphysema.  She is currently being treated by Dr. Charise Carwin.  Will coordinate surveillance CT Chests in the future.   Pulmonary nodules followed by Dr. Charise Carwin.  Jamacia Jester Bary Leriche

## 2022-09-21 ENCOUNTER — Encounter: Payer: Self-pay | Admitting: Pulmonary Disease

## 2022-09-21 MED ORDER — ZITHROMAX Z-PAK 250 MG PO TABS: ORAL_TABLET | ORAL | 0 refills | Status: DC

## 2022-09-21 NOTE — Telephone Encounter (Signed)
I have reviewed the CT scan.  Overall it looks improved.  However given persistence of the nodules I have sent in a prescription for Z-Pak.  Please order a follow-up CT without contrast in 6 months.

## 2022-09-25 ENCOUNTER — Encounter: Payer: Self-pay | Admitting: Pulmonary Disease

## 2022-09-25 ENCOUNTER — Encounter: Payer: Self-pay | Admitting: Internal Medicine

## 2022-09-26 NOTE — Telephone Encounter (Signed)
Okay to send in a prescription for generic azithromycin.  Prescribed 500 mg on day 1 and 250 mg on day 2-5.  If generic azithromycin is not available then prescribe doxycycline 100 mg twice daily for 7 days  There are no drug interactions between these antibiotics and the cholesterol medication she is on.

## 2022-09-28 MED ORDER — AZITHROMYCIN 250 MG PO TABS: 250.0000 mg | ORAL_TABLET | Freq: Every day | ORAL | 0 refills | Status: DC

## 2022-09-28 NOTE — Telephone Encounter (Signed)
Okay to send in a prescription for generic azithromycin.  Prescribed 500 mg on day 1 and 250 mg on day 2-5.  If generic azithromycin is not available then prescribe doxycycline 100 mg twice daily for 7 days   There are no drug interactions between these antibiotics and the cholesterol medication she is on.

## 2022-09-28 NOTE — Telephone Encounter (Signed)
Dr. Vaughan Browner please advise on the following My Chart message:   [facility] (supporting Marshell Garfinkel, MD) 3 days ago    Hi- I sent this message to the incorrect doctor, which is probably why there was no response. Insurance will not pay for the z-pak antibiotic, but will pay for the generic. Can the prescription for the generic be sent to [facility]? Plus is there any interaction between the antibiotic and other meds I take, like the high cholesterol statins? Thanks    Thank you

## 2022-09-28 NOTE — Telephone Encounter (Signed)
Azithromycin order sent into pharmacy and pt updated via My Chart. Nothing further needed at this time.

## 2022-09-29 ENCOUNTER — Ambulatory Visit
Admission: RE | Admit: 2022-09-29 | Discharge: 2022-09-29 | Disposition: A | Discharge status: Home / Self Care | Payer: PPO | Source: Ambulatory Visit | Attending: Obstetrics and Gynecology | Admitting: Obstetrics and Gynecology

## 2022-09-29 ENCOUNTER — Other Ambulatory Visit: Payer: Self-pay | Admitting: Endocrinology

## 2022-09-29 DIAGNOSIS — M8589 Other specified disorders of bone density and structure, multiple sites: Secondary | ICD-10-CM | POA: Diagnosis not present

## 2022-09-29 DIAGNOSIS — E2839 Other primary ovarian failure: Secondary | ICD-10-CM

## 2022-09-29 DIAGNOSIS — Z780 Asymptomatic menopausal state: Secondary | ICD-10-CM | POA: Diagnosis not present

## 2022-09-29 DIAGNOSIS — R932 Abnormal findings on diagnostic imaging of liver and biliary tract: Secondary | ICD-10-CM

## 2022-10-02 ENCOUNTER — Ambulatory Visit: Payer: PPO | Admitting: Acute Care

## 2022-10-09 ENCOUNTER — Other Ambulatory Visit: Payer: PPO

## 2022-10-13 ENCOUNTER — Ambulatory Visit: Payer: PPO | Admitting: Acute Care

## 2022-10-13 ENCOUNTER — Encounter: Payer: Self-pay | Admitting: Acute Care

## 2022-10-13 VITALS — BP 120/70 | HR 61 | Temp 98.3°F | Ht 67.0 in | Wt 194.6 lb

## 2022-10-13 DIAGNOSIS — J449 Chronic obstructive pulmonary disease, unspecified: Secondary | ICD-10-CM | POA: Diagnosis not present

## 2022-10-13 DIAGNOSIS — R918 Other nonspecific abnormal finding of lung field: Secondary | ICD-10-CM | POA: Diagnosis not present

## 2022-10-13 NOTE — Patient Instructions (Addendum)
It is good to see you today. Continue Trelegy 1 puff once daily  Rinse mouth after use. Continue Albuterol as needed for shortness of breath of wheezing.  Continue Mucinex to thin secretions , take with a full glass of water  Add flutter valve 4 blows at a time , several times a day for chest congestion  CT Chest in 6 months to follow up nodules.  If you change your mind about a biologic or about trying Breztri to see if this helps with hoarseness, let us know.  Follow up with Dr. Vaughan Browner 11/2022. Note your daily symptoms > remember "red flags" for COPD:  Increase in cough, increase in sputum production, increase in shortness of breath or activity intolerance. If you notice these symptoms, please call to be seen.    Please contact office for sooner follow up if symptoms do not improve or worsen or seek emergency care

## 2022-10-13 NOTE — Progress Notes (Signed)
History of Present Illness [name] [name] is a 66 y.o. female  f  former smoker ( 30 pack year smoking history, quit 2007) with history of COPD .Previously followed by Dr. Melvyn Novas.  Had not been seen by [facility] since 2013 when she was referred back and was seen by Dr. Vaughan Browner 11/2021 at the request of her PCP.   Maintenance : Trelegy Rescue:  Albuterol as needed for shortness of breath or wheezing   10/13/2022 Pt. Presents for follow up. She was last seen 09/01/2022 for follow up after an acute visit 08/25/2022 for a COPD Flare. She was treated with Doxycycline BID x 7 days, and a prednisone taper , and had resolution of symptoms, but continued dyspnea with exertion, so we did an additional prednisone taper which she completed. . She is here today for follow up.   Pt. States she has been doing well.Dr. Vaughan Browner had reviewed her CT Chest 09/14/2022 and send in additional  antibiotic, azithromycin for 6 days. She states she is coughing up scant secretions. She is using mucinex We will set her up with a flutter valve to help with clearance. No fever, she denies any wheezing. She does still have occasional shortness of breath. She has intentional weight loss due to activity. She is using an non sedating antihistamine daily.  She does have some post nasal drip. She has this always, she does not think this is seasonal.   She does have elevated eosinophils . We discussed a biologic, which she has deferred on for now.  She does think she may have hoarseness related to the powder formulation of Trelegy. I have asked her to let me know if she would like to try Breztri ( aerosol formulation ).  Follow up with Dr. Vaughan Browner in January 2024.   AAA is stable 4.7 cm 02/2022, 4.6 cm 08/2022. She has follow up with Dr. Kipp Brood.   Test Results: Super D CT Chest  09/14/2022 Reduced size of several of the prior pulmonary nodules/masses, including reduced size of the right upper lobe nodule currently measuring 3.0 by  1.2 cm, previously 4.2 by 2.3 cm. New cavitation of a left upper lobe nodule measuring 1.0 by 0.6 cm, previously 2.9 by 1.5 cm. 2. Stable 7 by 4 mm superior segment left lower lobe nodule. 3. Overall given the waxing and waning appearance of the nodules, atypical infection is most likely. Consider surveillance imaging in 6 months time. 4. Ascending thoracic aortic aneurysm 4.6 cm in diameter. Recommend semi-annual imaging followup by CTA or MRA and referral to cardiothoracic surgery if not already obtained   CT chest 08/21/2021-stable thoracic aortic aneurysm measuring 4.7 cm, mild coronary artery calcification, severe emphysema.       PFTs: 08/16/2012 FVC 3.71 [96%], FEV1 1.76 [50%], F/F 48, TLC 7.09 [125%], DLCO 17.75 [59%]  Labs: CBC 05/20/2021-WBC 8.5, eos 7%, absolute eosinophil count 595     Latest Ref Rng & Units 05/20/2021    1:12 PM  CBC  WBC 4.0 - 10.5 K/uL 8.5   Hemoglobin 12.0 - 15.0 g/dL 12.6   Hematocrit 36.0 - 46.0 % 38.5   Platelets 150 - 400 K/uL 316        Latest Ref Rng & Units 05/20/2021    1:12 PM  BMP  Glucose 70 - 99 mg/dL 95   BUN 8 - 23 mg/dL 11   Creatinine 0.44 - 1.00 mg/dL 0.74   Sodium 135 - 145 mmol/L 137   Potassium 3.5 - 5.1  mmol/L 3.5   Chloride 98 - 111 mmol/L 102   CO2 22 - 32 mmol/L 26   Calcium 8.9 - 10.3 mg/dL 8.9     BNP    Component Value Date/Time   BNP 77.9 05/20/2021 1312    ProBNP No results found for: "PROBNP"  PFT    Component Value Date/Time   FEV1PRE 1.26 12/12/2021 0840   FEV1POST 1.39 12/12/2021 0840   FVCPRE 2.70 12/12/2021 0840   FVCPOST 2.94 12/12/2021 0840   TLC 7.23 12/12/2021 0840   DLCOUNC 13.74 12/12/2021 0840   PREFEV1FVCRT 46 12/12/2021 0840   PSTFEV1FVCRT 47 12/12/2021 0840    DG BONE DENSITY (DXA)  Result Date: 09/29/2022 EXAM: DUAL X-RAY ABSORPTIOMETRY (DXA) FOR BONE MINERAL DENSITY IMPRESSION: Referring Physician:  Brien Few Your patient completed a bone mineral density test using GE  Lunar iDXA system (analysis version: 16). Technologist: sec PATIENT: Name: [name] [name], [name] [name] Patient ID: [id number] Birth Date: Apr 26, 1956 Height: 67.0 in. Sex: Female Measured: 09/29/2022 Weight: 195.0 lbs. Indications: Caucasian, Estrogen Deficient, Family Hist. (Parent hip fracture), Postmenopausal Fractures: NONE Treatments: Calcium (E943.0), Vitamin D (E933.5) ASSESSMENT: The BMD measured at Femur Neck Left is 0.810 g/cm2 with a T-score of -1.6. This patient is considered osteopenic/low bone mass according to [facility]) criteria. The quality of the exam is good. L4 was excluded due to degenerative changes. Site Region Measured Date Measured Age YA BMD Significant CHANGE T-score AP Spine L1-L3 09/29/2022 68.6 -1.5 1.002 g/cm2 * AP Spine L1-L3 06/16/2019 65.3 -1.9 0.955 g/cm2 * DualFemur Neck Left 09/29/2022 68.6 -1.6 0.810 g/cm2 * DualFemur Neck Left 06/16/2019 65.3 -1.3 0.859 g/cm2 DualFemur Total Mean 09/29/2022 68.6 -0.8 0.907 g/cm2 DualFemur Total Mean 06/16/2019 65.3 -0.7 0.916 g/cm2 Left Forearm Radius 33% 09/29/2022 68.6 -0.7 0.811 g/cm2 World Health Organization [facility]) criteria for post-menopausal, Caucasian Women: Normal       T-score at or above -1 SD Osteopenia   T-score between -1 and -2.5 SD Osteoporosis T-score at or below -2.5 SD RECOMMENDATION: 1. All patients should optimize calcium and vitamin D intake. 2. Consider FDA-approved medical therapies in postmenopausal women and men aged 70 years and older, based on the following: a. A hip or vertebral (clinical or morphometric) fracture. b. T-score = -2.5 at the femoral neck or spine after appropriate evaluation to exclude secondary causes. c. Low bone mass (T-score between -1.0 and -2.5 at the femoral neck or spine) and a 10-year probability of a hip fracture = 3% or a 10-year probability of a major osteoporosis-related fracture = 20% based on the [place]-adapted WHO algorithm. d. Clinician judgment and/or patient preferences may  indicate treatment for people with 10-year fracture probabilities above or below these levels. FOLLOW-UP: Patients with diagnosis of osteoporosis or at high risk for fracture should have regular bone mineral density tests.? Patients eligible for Medicare are allowed routine testing every 2 years.? The testing frequency can be increased to one year for patients who have rapidly progressing disease, are receiving or discontinuing medical therapy to restore bone mass, or have additional risk factors. I have reviewed this study and agree with the findings. [facility] FRAX* 10-year Probability of Fracture Based on femoral neck BMD: DualFemur (Left) Major Osteoporotic Fracture: 16.1% Hip Fracture:                2.4% Population:                  [place] (Caucasian) Risk Factors:  Family Hist. 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 ASSESSMENT: The probability of a major osteoporotic fracture is 16.1% within the next ten years. The probability of a hip fracture is 2.4% within the next ten years. Electronically Signed   By: Ammie Ferrier M.D.   On: 09/29/2022 08:19   CT Super D Chest Wo Contrast  Result Date: 09/15/2022 CLINICAL DATA:  Lung nodule.  History of thoracic aortic aneurysm. EXAM: CT CHEST WITHOUT CONTRAST TECHNIQUE: Multidetector CT imaging of the chest was performed using thin slice collimation for electromagnetic bronchoscopy planning purposes, without intravenous contrast. RADIATION DOSE REDUCTION: This exam was performed according to the departmental dose-optimization program which includes automated exposure control, adjustment of the mA and/or kV according to patient size and/or use of iterative reconstruction technique. COMPARISON:  06/08/2022 FINDINGS: Cardiovascular: Ascending thoracic aortic aneurysm 4.6 cm in diameter on image  78 series 2, stable by my measurements. Coronary, aortic arch, and branch vessel atherosclerotic vascular disease. Mediastinum/Nodes: Unremarkable Lungs/Pleura: Severe emphysema. Mild mucous plugging medially in the right lower lobe, image 124 series 8. Volume loss partially contributing to a 3.0 by 1.2 cm right upper lobe nodule on image 60 series 8, formerly this measured 4.2 by 2.3 cm by my measurements. A band of density extends cephalad from this nodule to a region of pleural thickening along the left upper lobe posteriorly. Bandlike scarring in the apicoposterior segment left upper lobe. Minimal lingular scarring. Stable 7 by 4 mm superior segment left lower lobe nodule on image 76 series 8. This measured substantially larger on 03/02/2022. New cavitation of a left upper lobe nodule measuring 1.0 by 0.6 cm on image 45 of series 8, this nodule previously was measured at 2.9 by 1.5 cm and is thus substantially reduced in size from prior. Upper Abdomen: Slight nodularity of the hepatic contour raising the possibility of cirrhosis. Abdominal aortic atherosclerotic vascular calcification. No splenomegaly. Musculoskeletal: Mild thoracic spondylosis and kyphosis. IMPRESSION: 1. Reduced size of several of the prior pulmonary nodules/masses, including reduced size of the right upper lobe nodule currently measuring 3.0 by 1.2 cm, previously 4.2 by 2.3 cm. New cavitation of a left upper lobe nodule measuring 1.0 by 0.6 cm, previously 2.9 by 1.5 cm. 2. Stable 7 by 4 mm superior segment left lower lobe nodule. 3. Overall given the waxing and waning appearance of the nodules, atypical infection is most likely. Consider surveillance imaging in 6 months time. 4. Ascending thoracic aortic aneurysm 4.6 cm in diameter. Recommend semi-annual imaging followup by CTA or MRA and referral to cardiothoracic surgery if not already obtained. This recommendation follows 2010 ACCF/AHA/AATS/ACR/ASA/SCA/SCAI/SIR/STS/SVM Guidelines for the  Diagnosis and Management of Patients With Thoracic Aortic Disease. Circulation. 2010; 121: F643-P295. Aortic aneurysm NOS (ICD10-I71.9) 5. Suspected hepatic cirrhosis. 6. Aortic and coronary atherosclerosis. Aortic Atherosclerosis (ICD10-I70.0). Electronically Signed   By: Van Clines M.D.   On: 09/15/2022 09:47     Past medical hx Past Medical History:  Diagnosis Date   Arthritis    right knee, left hip   Carpal tunnel syndrome of left wrist 05/2017   COPD, severe (HCC)    SOB with ADLs per pt. - no O2   Cubital tunnel syndrome on left 05/2017   Dental crowns present    also dental implants   Esophageal reflux    Family history of adverse reaction to anesthesia    pt's mother has  hx. of agitation and hallucinations post-op   History of asthma    as a child   Hypothyroidism (acquired)    Tachycardia    Holter monitor 06/08/2017 - 06/10/2017:  does not have results yet     Social History   Tobacco Use   Smoking status: Former    Packs/day: 0.00    Years: 30.00    Total pack years: 0.00    Types: Cigarettes    Start date: 12/01/2003    Quit date: 01/06/2013    Years since quitting: 9.7   Smokeless tobacco: Never  Vaping Use   Vaping Use: Never used  Substance Use Topics   Alcohol use: Yes    Comment: 2 beers per week   Drug use: No    Ms.Mathwig reports that she quit smoking about 9 years ago. Her smoking use included cigarettes. She started smoking about 18 years ago. She has never used smokeless tobacco. She reports current alcohol use. She reports that she does not use drugs.  Tobacco Cessation: Ms.Denherder reports that she quit smoking about 9 years ago. Her smoking use included cigarettes. She started smoking about 18 years ago. She has never used smokeless tobacco. She reports current alcohol use. She reports that she does not use drugs.  Former smoker , 30 pack year smoking history, Quit in 2007.   Past surgical hx, Family hx, Social hx all reviewed.  Current  Outpatient Medications on File Prior to Visit  Medication Sig   albuterol (VENTOLIN HFA) 108 (90 Base) MCG/ACT inhaler Inhale 2 puffs into the lungs every 4 (four) hours as needed for wheezing or shortness of breath.   amLODipine (NORVASC) 2.5 MG tablet Take 2.5 mg by mouth daily.   aspirin EC 81 MG tablet Take 81 mg by mouth daily. Swallow whole.   azithromycin (ZITHROMAX) 250 MG tablet Take 1 tablet (250 mg total) by mouth daily. 500 mg on day 1 and 250 mg on day 2-5 (Patient not taking: Reported on 10/13/2022)   cholecalciferol (VITAMIN D) 1000 units tablet Take 1,000 Units by mouth daily.   ezetimibe (ZETIA) 10 MG tablet Take 1 tablet (10 mg total) by mouth daily.   Fluticasone-Umeclidin-Vilant (TRELEGY ELLIPTA) 100-62.5-25 MCG/ACT AEPB    Fluticasone-Umeclidin-Vilant (TRELEGY ELLIPTA) 100-62.5-25 MCG/ACT AEPB Inhale 1 puff into the lungs daily.   losartan (COZAAR) 25 MG tablet Take 25 mg by mouth daily.   Multiple Vitamin (MULTIVITAMIN) tablet Take 1 tablet by mouth daily.   omeprazole (PRILOSEC) 20 MG capsule Take 20 mg by mouth 2 (two) times daily.   predniSONE (DELTASONE) 10 MG tablet Prednisone taper; 10 mg tablets:  3 tabs x 2 days, 2 tabs x 2 days 1 tab x 2 days then stop.   rosuvastatin (CRESTOR) 40 MG tablet Take 1 tablet (40 mg total) by mouth daily.   SYNTHROID 100 MCG tablet Take 100 mcg by mouth daily.   No current facility-administered medications on file prior to visit.     Allergies  Allergen Reactions   Peanut-Containing Drug Products Shortness Of Breath    [place] NUTS   Codeine Other (See Comments)    UNKNOWN   Other Other (See Comments)    Review Of Systems:  Constitutional:   + intentional   weight loss, No night sweats,  Fevers, chills, fatigue, or  lassitude.  HEENT:   No headaches,  Difficulty swallowing,  Tooth/dental problems, or  Sore throat,  No sneezing, itching, ear ache, nasal congestion,+ post nasal drip,   CV:  No chest pain,   Orthopnea, PND, swelling in lower extremities, anasarca, dizziness, palpitations, syncope.   GI  No heartburn, indigestion, abdominal pain, nausea, vomiting, diarrhea, change in bowel habits, loss of appetite, bloody stools.   Resp: + shortness of breath with exertion less at rest.  No excess mucus, no productive cough,  No non-productive cough,  No coughing up of blood.  No change in color of mucus.  No wheezing.  No chest wall deformity  Skin: no rash or lesions.  GU: no dysuria, change in color of urine, no urgency or frequency.  No flank pain, no hematuria   MS:  No joint pain or swelling.  No decreased range of motion.  No back pain.  Psych:  No change in mood or affect. No depression or anxiety.  No memory loss.   Vital Signs BP 120/70 (BP Location: Right Arm, Cuff Size: Normal)   Pulse 61   Temp 98.3 F (36.8 C) (Oral)   Ht '5\' 7"'$  (1.702 m)   Wt 194 lb 9.6 oz (88.3 kg)   SpO2 97%   BMI 30.48 kg/m    Physical Exam:  General- No distress,  A&Ox3 ENT: No sinus tenderness, TM clear, pale nasal mucosa, no oral exudate,no post nasal drip, no LAN Cardiac: S1, S2, regular rate and rhythm, no murmur Chest: No wheeze/ rales/ dullness; no accessory muscle use, no nasal flaring, no sternal retractions Abd.: Soft Non-tender Ext: No clubbing cyanosis, edema Neuro:  normal strength Skin: No rashes, warm and dry Psych: normal mood and behavior   Assessment/Plan Slow to Resolve COPD exacerbation>> resolved Plan Continue Trelegy 1 puff once daily  Rinse mouth after use. Continue Albuterol as needed for shortness of breath of wheezing.  Continue Mucinex to thin secretions , take with a full glass of water  Add flutter valve 4 blows at a time , several times a day for chest congestion  CT Chest in 6 months to follow up nodules.  If you change your mind about a biologic or about trying Breztri to see if this helps with hoarseness, let us know.  Follow up with Dr. Vaughan Browner  11/2022. Note your daily symptoms > remember "red flags" for COPD:  Increase in cough, increase in sputum production, increase in shortness of breath or activity intolerance. If you notice these symptoms, please call to be seen.    Please contact office for sooner follow up if symptoms do not improve or worsen or seek emergency care  Pulmonary Nodules Plan Most recent CT Chest shows stable to shrinking nodules.  Recommendation is for 6 month follow up.    AAA  Plan Stable per 08/2022 CT Chest  Continue Q 6 month CT's to monitor AAA Follow up with Dr. Kipp Brood     I spent 35 minutes dedicated to the care of this patient on the date of this encounter to include pre-visit review of records, face-to-face time with the patient discussing conditions above, post visit ordering of testing, clinical documentation with the electronic health record, making appropriate referrals as documented, and communicating necessary information to the patient's healthcare team.    Magdalen Spatz, NP 10/13/2022  8:50 AM

## 2022-10-14 NOTE — Telephone Encounter (Signed)
Mychart message sent by pt: [name] [name]  [facility] (supporting Magdalen Spatz, NP)18 minutes ago (3:51 PM)    Hi- a CT Scan has been scheduled for 10/30/22 & I believe it needs to be scheduled for the April time frame. Please advise. Thank you!!      Pt just had an OV with SG 11/14 and during that OV, SG stated for pt to have a CT chest in 6 months to follow up on nodules.  Routing to [facility] for help with this. Please advise.

## 2022-10-20 ENCOUNTER — Ambulatory Visit
Admission: RE | Admit: 2022-10-20 | Discharge: 2022-10-20 | Disposition: A | Discharge status: Home / Self Care | Payer: PPO | Source: Ambulatory Visit | Attending: Endocrinology | Admitting: Endocrinology

## 2022-10-20 DIAGNOSIS — K7689 Other specified diseases of liver: Secondary | ICD-10-CM | POA: Diagnosis not present

## 2022-10-20 DIAGNOSIS — R932 Abnormal findings on diagnostic imaging of liver and biliary tract: Secondary | ICD-10-CM

## 2022-10-24 NOTE — Progress Notes (Signed)
Cardiology Office Note:    Date:  10/26/2022   ID:  [name] [name], DOB 04-Mar-1956, MRN [id number]  PCP:  [name], MD   [facility] Prov[facility] Cardiologist:  Lenna Sciara, MD Referring MD: Lajean Manes, MD   Chief Complaint/Reason for Referral: Routine follow-up aortic aneurysm  ASSESSMENT:    1. Aneurysm of ascending aorta without rupture ([place])   2. Coronary artery calcification   3. Primary hypertension   4. Hyperlipidemia LDL goal <70   5. Chronic obstructive pulmonary disease, unspecified COPD type ([name])   6. Aortic atherosclerosis (H[facility])      PLAN:    In order of problems listed above: 1.  Ascending aortic aneurysm: Followed by cardiothoracic surgery and stable size.  Continue ongoing surveillance.   2.  Coronary artery calcification:  Continue aspirin and statin with strict blood pressure control. 3.  Hyperlipidemia: LDL is at goal.  Due to myalgias we will stop Crestor 40 mg and start atorvastatin 40 mg and check a lipid panel and LFTs in 2 months.  She has been appointment with Dr. [name] in March to discuss very elevated LP(a). 4.  Hypertension: Blood pressures well controlled on her current regimen. 5.  COPD: This is followed by the pulmonary division. 6.  Aortic atherosclerosis: Continue aspirin and statin with strict blood pressure control.  Dispo:  Return in about 9 months (around 07/27/2023).      Medication Adjustments/Labs and Tests Ordered: Current medicines are reviewed at length with the patient today.  Concerns regarding medicines are outlined above.  The following changes have been made:  no change   Labs/tests ordered: Orders Placed This Encounter  Procedures   Lipid Profile   Hepatic function panel   EKG 12-Lead    Medication Changes: Meds ordered this encounter  Medications   atorvastatin (LIPITOR) 40 MG tablet    Sig: Take 1 tablet (40 mg total) by mouth daily.    Dispense:  90 tablet    Refill:  3    Patient to stop  Rosuvastatin     Current medicines are reviewed at length with the patient today.  The patient does not have concerns regarding medicines.   History of Present Illness:    FOCUSED PROBLEM LIST:   1.  Thoracic aortic ascending aneurysm around 4.6 to 4.7 cm upon echocardiography and CT scanning; followed by CT surgery 2.  Multivessel coronary artery calcifications 3.  COPD with emphysema 4.  Hyperlipidemia with Lp(a) >300; myalgias in response crestor 6.  Hypertension  The patient is a 66 y.o. female with the indicated medical history here for routine follow up.  November 2022: The patient was doing well and no changes were made to her medical regimen with plans for every 6 month CT evaluation for her aneurysm.  May 2023: The patient is doing well.  She would like to exercise on a stationary bike.  She denies any chest pain, palpitations, paroxysmal nocturnal dyspnea, orthopnea.  She has been completely compliant with her medications.  She was recently changed to rosuvastatin by her primary care provider.  She is tolerating this well.  Plan: Continue current therapy.  Today: The patient has been doing well.  She denies any chest pain, shortness of breath, signs or symptoms of stroke, paroxysmal nocturnal dyspnea, orthopnea.  She has some mild right ankle swelling.  She is not sure whether she dropped anything on her ankle.  She has had some myalgias in response to Crestor.  She fortunately has not  required any emergency room visits or hospitalizations.    Current Medications: Current Meds  Medication Sig   albuterol (VENTOLIN HFA) 108 (90 Base) MCG/ACT inhaler Inhale 2 puffs into the lungs every 4 (four) hours as needed for wheezing or shortness of breath.   amLODipine (NORVASC) 2.5 MG tablet Take 2.5 mg by mouth daily.   aspirin EC 81 MG tablet Take 81 mg by mouth daily. Swallow whole.   atorvastatin (LIPITOR) 40 MG tablet Take 1 tablet (40 mg total) by mouth daily.   cholecalciferol  (VITAMIN D) 1000 units tablet Take 1,000 Units by mouth daily.   ezetimibe (ZETIA) 10 MG tablet Take 1 tablet (10 mg total) by mouth daily.   losartan (COZAAR) 25 MG tablet Take 25 mg by mouth daily.   Multiple Vitamin (MULTIVITAMIN) tablet Take 1 tablet by mouth daily.   omeprazole (PRILOSEC) 20 MG capsule Take 20 mg by mouth 2 (two) times daily.   SYNTHROID 100 MCG tablet Take 100 mcg by mouth daily.   [DISCONTINUED] rosuvastatin (CRESTOR) 40 MG tablet Take 1 tablet (40 mg total) by mouth daily.     Allergies:    Peanut-containing drug products, Codeine, and Other   Social History:   Social History   Tobacco Use   Smoking status: Former    Packs/day: 0.00    Years: 30.00    Total pack years: 0.00    Types: Cigarettes    Start date: 12/01/2003    Quit date: 01/06/2013    Years since quitting: 9.8   Smokeless tobacco: Never  Vaping Use   Vaping Use: Never used  Substance Use Topics   Alcohol use: Yes    Comment: 2 beers per week   Drug use: No     Family Hx: Family History  Problem Relation Age of Onset   Colon cancer Mother    Breast cancer Maternal Aunt    Allergies Brother      Review of Systems:   Please see the history of present illness.    All other systems reviewed and are negative.     EKGs/Labs/Other Test Reviewed:    EKG:  EKG performed June 2022 that I personally reviewed demonstrates sinus rhythm with PACs and nonspecific ST and T wave changes.  EKG done today that I personally reviewed demonstrates sinus bradycardia.  Prior CV studies:  TTE October 2022 demonstrates ejection fraction of 60 to 65% with grade 1 diastolic dysfunction no significant valvular abnormalities and ascending aortic diameter of 45 mm  CT April 2022 demonstrates thoracic aortic aneurysm measuring 4.7 cm with coronary artery disease and severe emphysematous changes  CT September 2022 demonstrates stable thoracic aortic aneurysm measuring 4.7 cm  CT April 2023 demonstrates a  stable 4.7 cm thoracic aortic aneurysm   Other studies Reviewed: Review of the additional studies/records demonstrates: None relevant  Recent Labs: 06/03/2022: ALT 29   Recent Lipid Panel Lab Results  Component Value Date/Time   CHOL 108 08/10/2022 08:40 AM   TRIG 66 08/10/2022 08:40 AM   HDL 47 08/10/2022 08:40 AM   LDLCALC 47 08/10/2022 08:40 AM    Risk Assessment/Calculations:          Physical Exam:    VS:  BP 120/72   Pulse (!) 57   Ht '5\' 7"'$  (1.702 m)   Wt 196 lb (88.9 kg)   SpO2 96%   BMI 30.70 kg/m    Wt Readings from Last 3 Encounters:  10/26/22 196 lb (88.9 kg)  10/13/22  194 lb 9.6 oz (88.3 kg)  09/01/22 198 lb 6.4 oz (90 kg)    GENERAL:  No apparent distress, AOx3 HEENT:  No carotid bruits, +2 carotid impulses, no scleral icterus CAR: RRR no murmurs, gallops, rubs, or thrills RES:  Clear to auscultation bilaterally ABD:  Soft, nontender, nondistended, positive bowel sounds x 4 VASC:  +2 radial pulses, +2 carotid pulses, palpable pedal pulses NEURO:  CN 2-12 grossly intact; motor and sensory grossly intact PSYCH:  No active depression or anxiety EXT:  No edema, ecchymosis, or cyanosis  Signed, Early Osmond, MD  10/26/2022 11:07 AM    [facility] [address], [place], [place]  [address] Phone: [phone]; Fax: [phone]   Note:  This document was prepared using Dragon voice recognition software and may include unintentional dictation errors.

## 2022-10-26 ENCOUNTER — Ambulatory Visit: Payer: PPO | Attending: Internal Medicine | Admitting: Internal Medicine

## 2022-10-26 ENCOUNTER — Encounter: Payer: Self-pay | Admitting: Internal Medicine

## 2022-10-26 VITALS — BP 120/72 | HR 57 | Ht 67.0 in | Wt 196.0 lb

## 2022-10-26 DIAGNOSIS — I700 Atherosclerosis of aorta: Secondary | ICD-10-CM | POA: Diagnosis not present

## 2022-10-26 DIAGNOSIS — I10 Essential (primary) hypertension: Secondary | ICD-10-CM

## 2022-10-26 DIAGNOSIS — E785 Hyperlipidemia, unspecified: Secondary | ICD-10-CM

## 2022-10-26 DIAGNOSIS — J449 Chronic obstructive pulmonary disease, unspecified: Secondary | ICD-10-CM

## 2022-10-26 DIAGNOSIS — I7121 Aneurysm of the ascending aorta, without rupture: Secondary | ICD-10-CM | POA: Diagnosis not present

## 2022-10-26 DIAGNOSIS — I2584 Coronary atherosclerosis due to calcified coronary lesion: Secondary | ICD-10-CM

## 2022-10-26 DIAGNOSIS — I2510 Atherosclerotic heart disease of native coronary artery without angina pectoris: Secondary | ICD-10-CM | POA: Diagnosis not present

## 2022-10-26 MED ORDER — ATORVASTATIN CALCIUM 40 MG PO TABS: 40.0000 mg | ORAL_TABLET | Freq: Every day | ORAL | 3 refills | Status: DC

## 2022-10-26 NOTE — Patient Instructions (Addendum)
Medication Instructions:  Your physician has recommended you make the following change in your medication:  Stop Rosuvastatin. Start Atorvastatin 40 mg by mouth daily   *If you need a refill on your cardiac medications before your next appointment, please call your pharmacy*   Lab Work: Your physician recommends that you return for lab work on December 25, 2022-The lab opens at 7:15 AM If you have labs (blood work) drawn today and your tests are completely normal, you will receive your results only by: [place] (if you have MyChart) OR A paper copy in the mail If you have any lab test that is abnormal or we need to change your treatment, we will call you to review the results.   Testing/Procedures: none   Follow-Up: At [facility], you and your health needs are our priority.  As part of our continuing mission to provide you with exceptional heart care, we have created designated Provider Care Teams.  These Care Teams include your primary Cardiologist (physician) and Advanced Practice Providers (APPs -  Physician Assistants and Nurse Practitioners) who all work together to provide you with the care you need, when you need it.  We recommend signing up for the patient portal called "MyChart".  Sign up information is provided on this After Visit Summary.  MyChart is used to connect with patients for Virtual Visits (Telemedicine).  Patients are able to view lab/test results, encounter notes, upcoming appointments, etc.  Non-urgent messages can be sent to your provider as well.   To learn more about what you can do with MyChart, go to [email or web address].    Your next appointment:   9 month(s)  The format for your next appointment:   In Person  Provider:   Early Osmond, MD     Other Instructions    Important Information About Sugar

## 2022-10-30 ENCOUNTER — Ambulatory Visit (HOSPITAL_BASED_OUTPATIENT_CLINIC_OR_DEPARTMENT_OTHER): Payer: PPO

## 2022-11-05 ENCOUNTER — Ambulatory Visit: Payer: PPO

## 2022-11-05 ENCOUNTER — Ambulatory Visit (INDEPENDENT_AMBULATORY_CARE_PROVIDER_SITE_OTHER): Payer: PPO | Admitting: Podiatry

## 2022-11-05 DIAGNOSIS — M778 Other enthesopathies, not elsewhere classified: Secondary | ICD-10-CM

## 2022-11-05 IMAGING — MG MM DIGITAL SCREENING BILAT W/ TOMO AND CAD
6 of 10 series · 6 of 30 positions shown · non-contrast
Comparison: Previous exam(s).

CLINICAL DATA: Screening.

EXAM:
DIGITAL SCREENING BILATERAL MAMMOGRAM WITH TOMOSYNTHESIS AND CAD
TECHNIQUE: Bilateral screening digital craniocaudal and mediolateral oblique
mammograms were obtained. Bilateral screening digital breast
tomosynthesis was performed. The images were evaluated with
computer-aided detection.

[R MLO synth-2D]
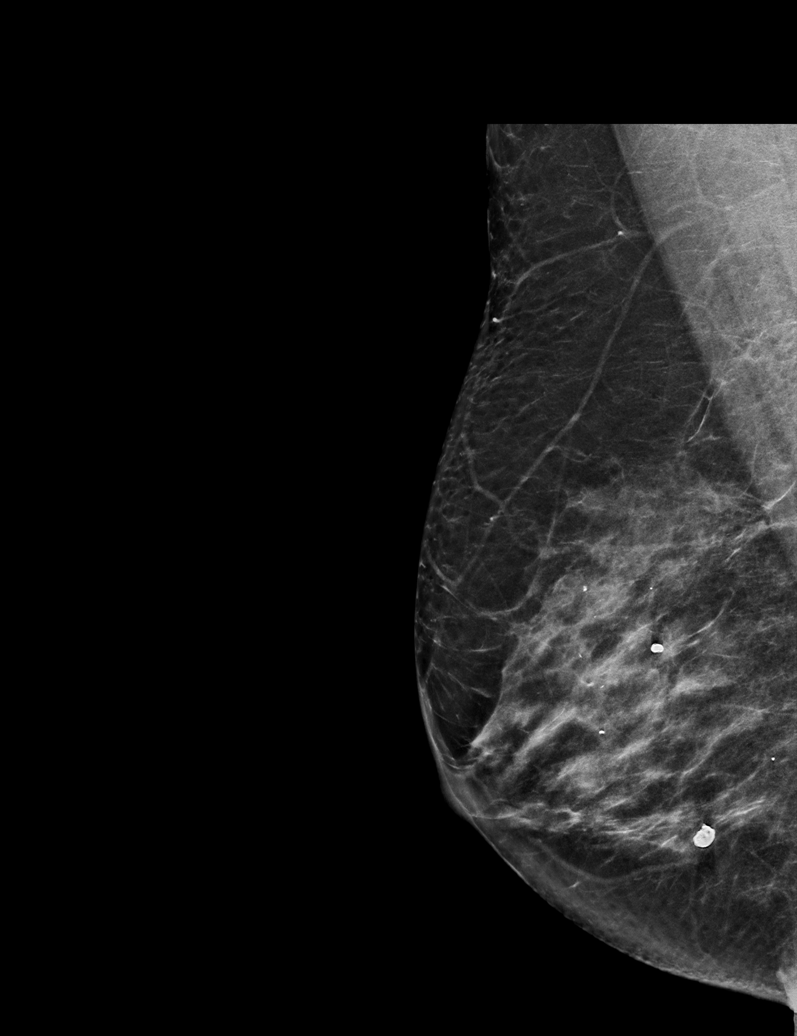

[L CC synth-2D (1 of 2)]
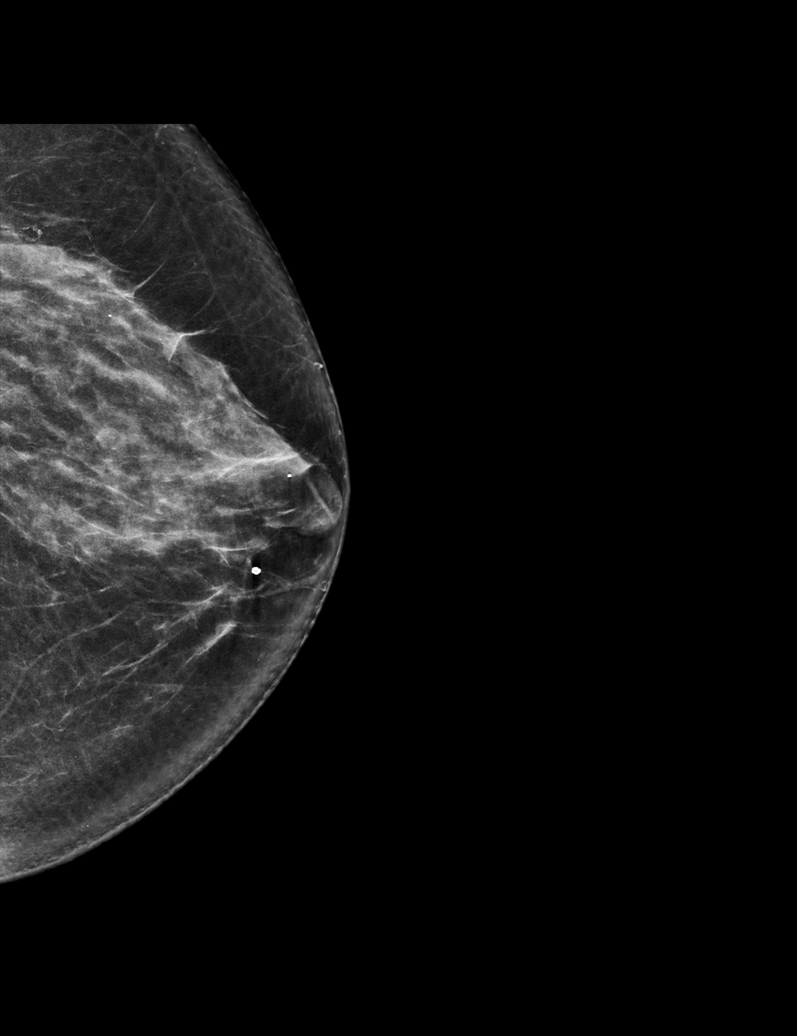

[L MLO synth-2D]
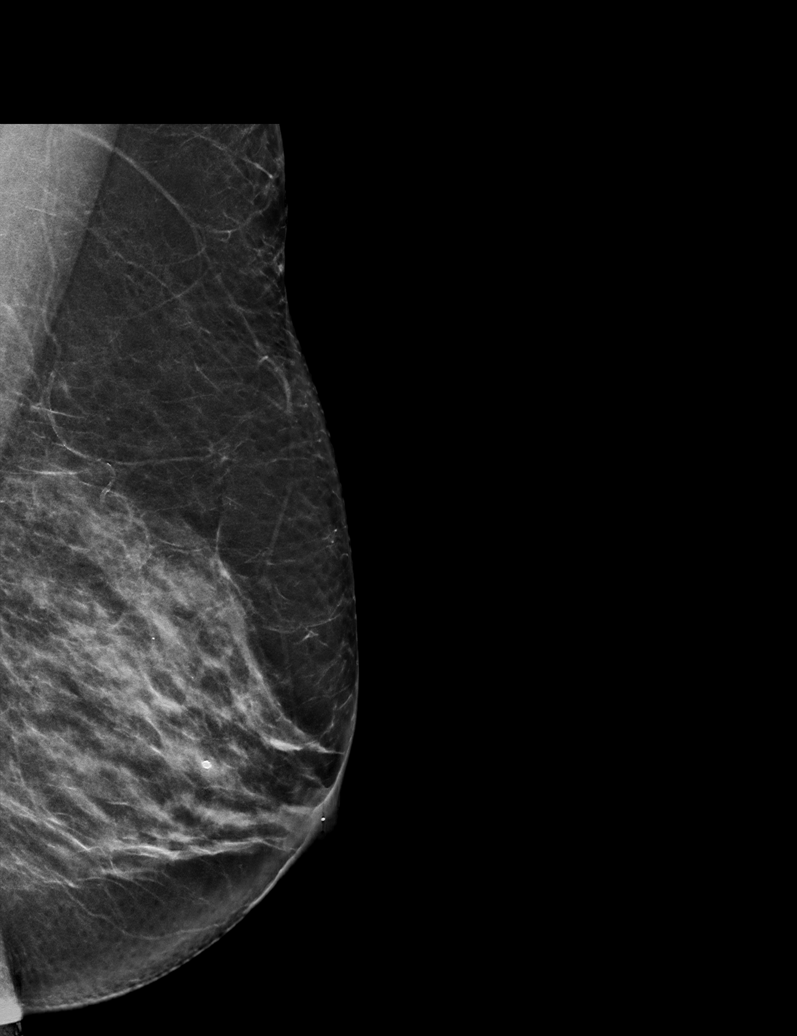

[R CC synth-2D]
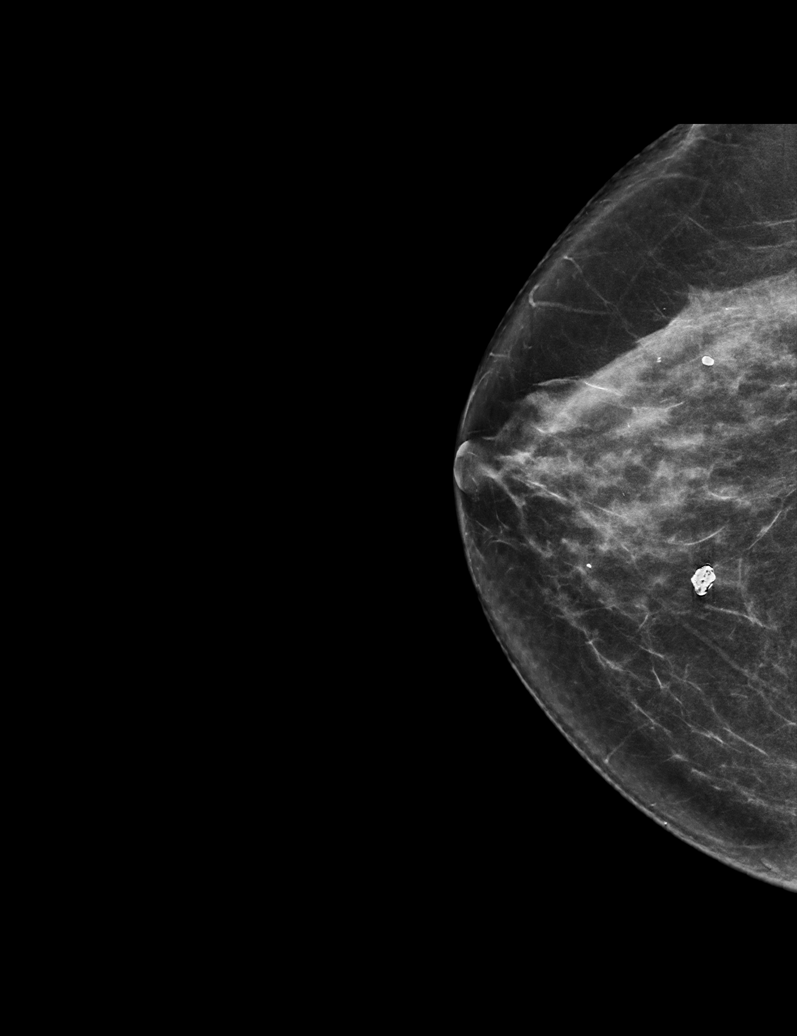

[L CC synth-2D (2 of 2)]
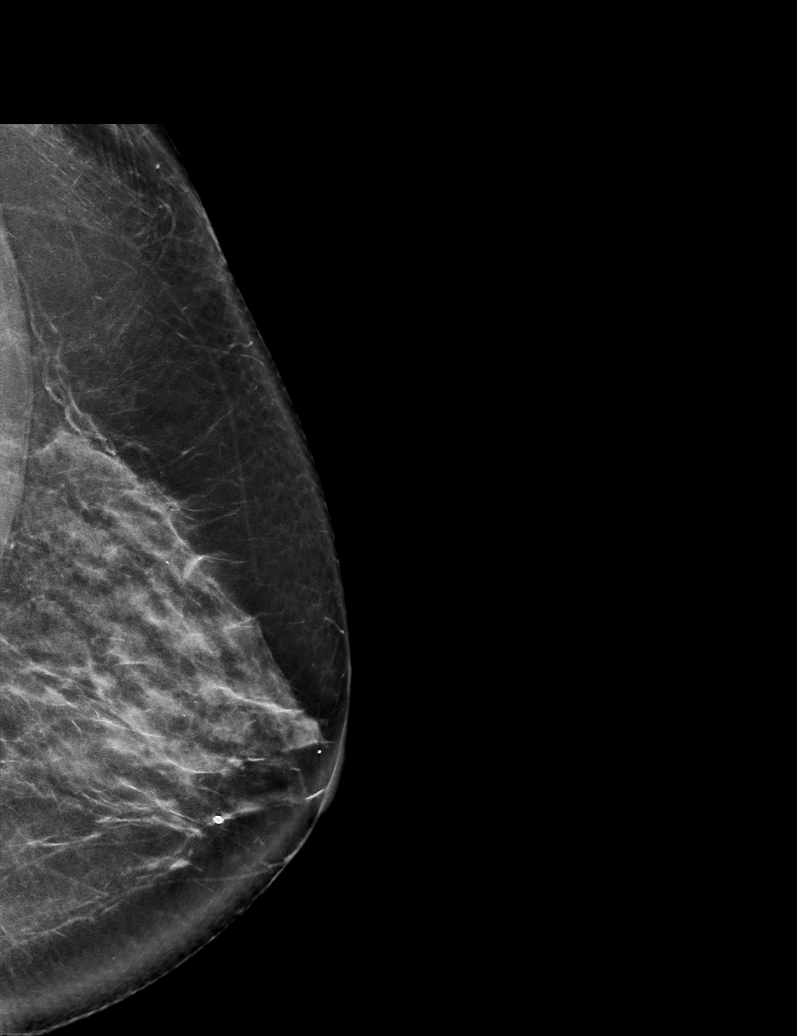

[R CC tomo · tomo slice 35/68.0]
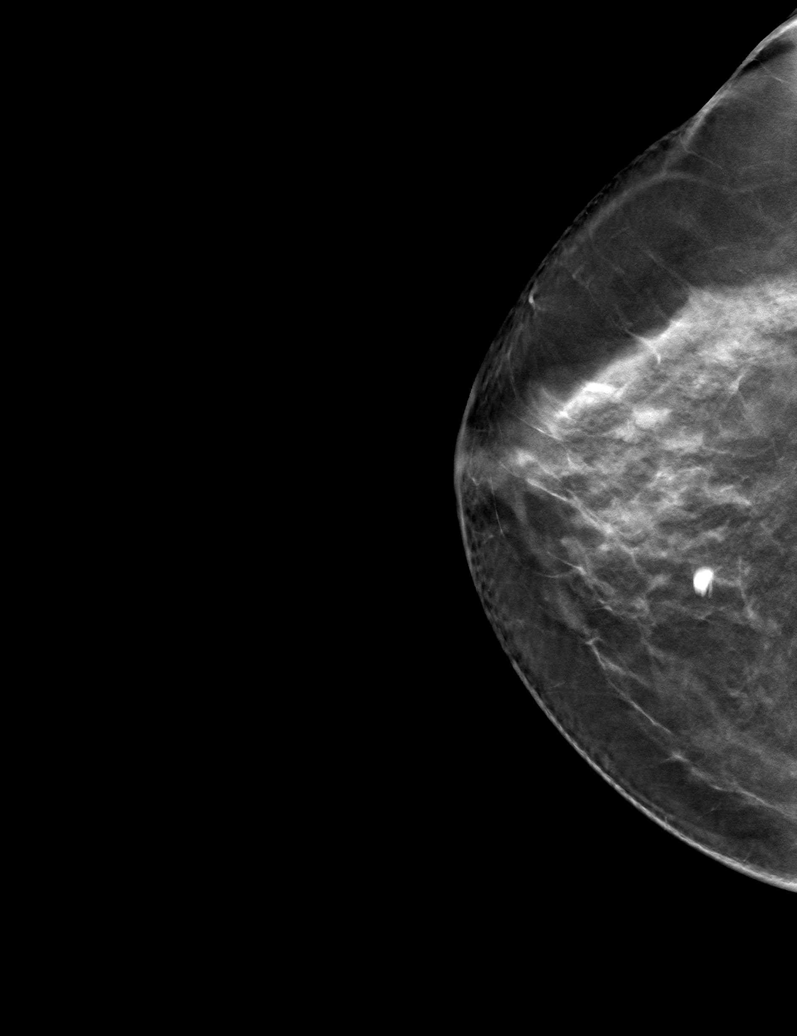

[6 of 30 positions shown; findings below may reference images not displayed]

ACR Breast Density Category c: The breast tissue is heterogeneously
dense, which may obscure small masses.
FINDINGS: There are no findings suspicious for malignancy.
IMPRESSION: No mammographic evidence of malignancy. A result letter of this
screening mammogram will be mailed directly to the patient.

RECOMMENDATION:
Screening mammogram in one year. (Code:Q3-W-BC3)

BI-RADS CATEGORY  1: Negative.

## 2022-11-05 MED ORDER — METHYLPREDNISOLONE 4 MG PO TBPK: ORAL_TABLET | ORAL | 0 refills | Status: DC

## 2022-11-05 NOTE — Progress Notes (Signed)
Subjective:  Patient ID: [name] [name], female    DOB: [birth year],  MRN: [id number]  Chief Complaint  Patient presents with   Foot Pain    left foot pain and swelling below toes on top of foot    66 y.o. female presents with concern for pain and swelling in the dorsal aspect of the left foot at the base of the toes.  She says that it was much worse when she scheduled the appointment however has improved greatly and is now not bothering her very much at this time.  She denies any open wound.  She says there was no injury that occurred not sure how it started.  She says the swelling is still slightly present but has been decreasing.  Her pain is also not too bad at this point.  Past Medical History:  Diagnosis Date   Arthritis    right knee, left hip   Carpal tunnel syndrome of left wrist 05/2017   COPD, severe (HCC)    SOB with ADLs per pt. - no O2   Cubital tunnel syndrome on left 05/2017   Dental crowns present    also dental implants   Esophageal reflux    Family history of adverse reaction to anesthesia    pt's mother has hx. of agitation and hallucinations post-op   History of asthma    as a child   Hypothyroidism (acquired)    Tachycardia    Holter monitor 06/08/2017 - 06/10/2017:  does not have results yet    Allergies  Allergen Reactions   Peanut-Containing Drug Products Shortness Of Breath    [place] NUTS   Codeine Other (See Comments)    UNKNOWN   Other Other (See Comments)    ROS: Negative except as per HPI above  Objective:  General: AAO x3, NAD  Dermatological: With inspection and palpation of the right and left lower extremities there are no open sores, no preulcerative lesions, no rash or signs of infection present. Nails are of normal length thickness and coloration.   Vascular:  Dorsalis Pedis artery and Posterior Tibial artery pedal pulses are 2/4 bilateral.  Capillary fill time < 3 sec to all digits.   Neruologic: Grossly intact via light touch  bilateral. Protective threshold intact to all sites bilateral.   Musculoskeletal: No significant tenderness with palpation about the second metatarsal head or third metatarsal head or second or third MPJ.  There is mild edema of this area however in the second toe is mildly edematous.  No pain increased with range of motion of the second or third toe.  No pain with palpation of the interspace to indicate neuroma.  Gait: Unassisted, Nonantalgic.   No images are attached to the encounter.  Radiographs:  Date: 11/05/2022 XR the left foot Weightbearing AP/Lateral/Oblique   Findings: no fracture, dislocation, swelling or degenerative changes noted.  The second metatarsal is however noted to be elongated compared to the first and the third. Assessment:   1. Capsulitis of foot, left      Plan:  Patient was evaluated and treated and all questions answered.  # Likely second MPJ capsulitis related to long second metatarsal and overloading of the area -Discussed with the patient that overall her issue seems self-limiting as it has been decreasing with conservative care has not really done much for it yet at this point. -I suspect it is a soft tissue inflammation of the second MPJ or the third MPJ related to the elongation of the second metatarsal.  Likely over use injury. -Recommend treatment with a steroid Dosepak methylprednisolone 4 mg take as directed for 6 days.  Believe this will reduce the residual inflammation and edema she has in the left dorsal forefoot. -Patient will follow-up as needed if she has continued issues with this area in the future.  Return if symptoms worsen or fail to improve.          Everitt Amber, DPM [facility]

## 2022-11-12 ENCOUNTER — Other Ambulatory Visit (HOSPITAL_COMMUNITY): Payer: PPO

## 2022-11-25 ENCOUNTER — Other Ambulatory Visit: Payer: Self-pay | Admitting: Internal Medicine

## 2022-11-25 DIAGNOSIS — Z1231 Encounter for screening mammogram for malignant neoplasm of breast: Secondary | ICD-10-CM

## 2022-12-08 ENCOUNTER — Other Ambulatory Visit (HOSPITAL_COMMUNITY): Payer: Self-pay

## 2022-12-09 ENCOUNTER — Other Ambulatory Visit (HOSPITAL_COMMUNITY): Payer: Self-pay

## 2022-12-09 MED ORDER — LEVOTHYROXINE SODIUM 100 MCG PO TABS
100.0000 ug | ORAL_TABLET | Freq: Every day | ORAL | 4 refills | Status: DC
  Filled 2022-12-09 – 2023-02-23 (×2): qty 90, 90d supply, fill #0

## 2022-12-09 MED ORDER — LEVOTHYROXINE SODIUM 100 MCG PO TABS
100.0000 ug | ORAL_TABLET | Freq: Every day | ORAL | 1 refills | Status: DC
  Filled 2022-12-09: qty 90, 90d supply, fill #0

## 2022-12-10 ENCOUNTER — Encounter: Payer: Self-pay | Admitting: Pulmonary Disease

## 2022-12-10 ENCOUNTER — Ambulatory Visit: Payer: PPO | Admitting: Pulmonary Disease

## 2022-12-10 ENCOUNTER — Other Ambulatory Visit (HOSPITAL_COMMUNITY): Payer: Self-pay

## 2022-12-10 VITALS — BP 116/62 | HR 61 | Temp 97.6°F | Ht 67.5 in | Wt 198.0 lb

## 2022-12-10 DIAGNOSIS — R0602 Shortness of breath: Secondary | ICD-10-CM | POA: Diagnosis not present

## 2022-12-10 DIAGNOSIS — R918 Other nonspecific abnormal finding of lung field: Secondary | ICD-10-CM

## 2022-12-10 DIAGNOSIS — J449 Chronic obstructive pulmonary disease, unspecified: Secondary | ICD-10-CM | POA: Diagnosis not present

## 2022-12-10 LAB — CBC WITH DIFFERENTIAL/PLATELET
Basophils Absolute: 0 10*3/uL (ref 0.0–0.1)
Basophils Relative: 0.6 % (ref 0.0–3.0)
Eosinophils Absolute: 0.2 10*3/uL (ref 0.0–0.7)
Eosinophils Relative: 2.8 % (ref 0.0–5.0)
HCT: 42.2 % (ref 36.0–46.0)
Hemoglobin: 13.9 g/dL (ref 12.0–15.0)
Lymphocytes Relative: 37.8 % (ref 12.0–46.0)
Lymphs Abs: 2.6 10*3/uL (ref 0.7–4.0)
MCHC: 32.9 g/dL (ref 30.0–36.0)
MCV: 93.7 fl (ref 78.0–100.0)
Monocytes Absolute: 0.6 10*3/uL (ref 0.1–1.0)
Monocytes Relative: 8 % (ref 3.0–12.0)
Neutro Abs: 3.5 10*3/uL (ref 1.4–7.7)
Neutrophils Relative %: 50.8 % (ref 43.0–77.0)
Platelets: 320 10*3/uL (ref 150.0–400.0)
RBC: 4.5 Mil/uL (ref 3.87–5.11)
RDW: 14.8 % (ref 11.5–15.5)
WBC: 6.9 10*3/uL (ref 4.0–10.5)

## 2022-12-10 MED ORDER — TRELEGY ELLIPTA 100-62.5-25 MCG/ACT IN AEPB
1.0000 | INHALATION_SPRAY | Freq: Every day | RESPIRATORY_TRACT | 1 refills | Status: DC
  Filled 2022-12-10 – 2023-02-23 (×2): qty 180, 90d supply, fill #0
  Filled 2023-05-30: qty 180, 90d supply, fill #1

## 2022-12-10 NOTE — Progress Notes (Signed)
[name] [name]    [id number]    02/15/1956  Primary Care Physician:Henderson, Walker Shadow, MD  Referring Physician: Lajean Manes, [place]. [employer] [address],  [place] [address]  Chief complaint: Follow-up for COPD  HPI: 67 y.o.  with history of COPD.  Previously followed by Dr. Melvyn Novas.  Has not been seen PCCM since 2013 Maintained on Spiriva.  Advair was started in June 2021 Complains of chronic dyspnea on exertion.  Occasional cough, no fevers or chills Other medical issues include allergies with postnasal drip, GERD for which she is on Prilosec twice daily.  She has occasional hoarseness of voice Evaluated by Dr. Valetta Mole ENT in April 2021 with laryngoscope showing vocal cord edema consistent with LPR  History notable for thoracic aortic aneurysm which was noted during follow-up for lung nodules.  The aneurysm is slowly increasing in size.  She has been evaluated by cardiothoracic surgery with continued surveillance recommended.  Lung nodules have subsequently resolved on follow-up CT scans  Finished pulmonary rehab 2022 Notes desats to 85% on exertion and has been getting supplemental oxygen  Pets: No pets Occupation: Retired Customer service manager Exposures: No mold, hot tub, Customer service manager.  No feather pillows or comforter Smoking history: 30-pack-year smoker.  Quit in 2007 Travel history: No significant travel history Relevant family history: No family history of lung disease  Interim history:  Continues on Trelegy for COPD.  Notes some mild increase in dyspnea since last visit.  Denies any cough, congestion, wheezing CT scan last year shows improving size of lung nodules  Outpatient Encounter Medications as of 12/10/2022  Medication Sig   albuterol (VENTOLIN HFA) 108 (90 Base) MCG/ACT inhaler Inhale 2 puffs into the lungs every 4 (four) hours as needed for wheezing or shortness of breath.   amLODipine (NORVASC) 2.5 MG tablet Take 2.5 mg by mouth daily.   aspirin EC 81 MG  tablet Take 81 mg by mouth daily. Swallow whole.   atorvastatin (LIPITOR) 40 MG tablet Take 1 tablet (40 mg total) by mouth daily.   cholecalciferol (VITAMIN D) 1000 units tablet Take 1,000 Units by mouth daily.   ezetimibe (ZETIA) 10 MG tablet Take 1 tablet (10 mg total) by mouth daily.   levothyroxine (SYNTHROID) 100 MCG tablet Take 1 tablet (100 mcg total) by mouth daily in the morning on an empty stomach   losartan (COZAAR) 25 MG tablet Take 25 mg by mouth daily.   Multiple Vitamin (MULTIVITAMIN) tablet Take 1 tablet by mouth daily.   omeprazole (PRILOSEC) 20 MG capsule Take 20 mg by mouth 2 (two) times daily.   SYNTHROID 100 MCG tablet Take 100 mcg by mouth daily.   TRELEGY ELLIPTA 100-62.5-25 MCG/ACT AEPB Inhale 1 puff into the lungs daily.   [DISCONTINUED] methylPREDNISolone (MEDROL DOSEPAK) 4 MG TBPK tablet Take as directed for 6 days   No facility-administered encounter medications on file as of 12/10/2022.    Physical Exam: Gen:      No acute distress HEENT:  EOMI, sclera anicteric Neck:     No masses; no thyromegaly Lungs:    Clear to auscultation bilaterally; normal respiratory effort CV:         Regular rate and rhythm; no murmurs Abd:      + bowel sounds; soft, non-tender; no palpable masses, no distension Ext:    No edema; adequate peripheral perfusion Skin:      Warm and dry; no rash Neuro: alert and oriented x 3 Psych:  normal mood and affect   Data Reviewed: Imaging: CT chest 08/21/2021-stable thoracic aortic aneurysm measuring 4.7 cm, mild coronary artery calcification, severe emphysema.    CT chest 06/08/2022-waxing and waning areas of nodularity bilaterally, 4.5 cm ascending aortic aneurysm.  CT chest 09/14/2022- Continued improvement in size of lung nodules. I have reviewed the images personally.  PFTs: 08/16/2012 FVC 3.71 [96%], FEV1 1.76 [50%], F/F 48, TLC 7.09 [125%], DLCO 17.75 [59%]  12/12/2021 FVC 2.94 [81%], FEV1 1.39 [50%], F/F 47, TLC 7.23 [127%],  DLCO 13.74 [61%] Severe obstruction with hyperinflation, air trapping.  Moderate diffusion impairment  Labs: CBC 05/20/2021-WBC 8.5, eos 7%, absolute eosinophil count 595  Assessment:  Severe COPD, asthma overlap syndrome May have asthmatic component of COPD given elevated peripheral eosinophils in the past.  Doing well on Trelegy.  No desats on exertion  We discussed change of therapy to [facility] or consideration of asthma biologics as she does have some increase in dyspnea but she would like to hold off for now Check chest x-ray, CBC, IgE and alpha-1 antitrypsin levels and phenotype today  Lung nodules Follow-up CT ordered for April 2024  Thoracic aortic aneurysm Follow-up on CT chest   Plan/Recommendations: Continue trelegy CBC, IgE, alpha-1 antitrypsin level Chest x-ray Follow-up CT in 3 months.  Marshell Garfinkel MD [facility] 12/10/2022, 8:43 AM  CC: Lajean Manes, MD

## 2022-12-10 NOTE — Patient Instructions (Signed)
Will get some labs today including CBC with differential, IgE Alpha-1 antitrypsin levels and phenotype Chest x-ray today Continue the inhaler as prescribed Follow-up in 6 months

## 2022-12-10 NOTE — Addendum Note (Signed)
Addended by: Elton Sin on: 12/10/2022 09:14 AM   Modules accepted: Orders

## 2022-12-15 ENCOUNTER — Ambulatory Visit (INDEPENDENT_AMBULATORY_CARE_PROVIDER_SITE_OTHER): Payer: PPO

## 2022-12-15 DIAGNOSIS — R0602 Shortness of breath: Secondary | ICD-10-CM

## 2022-12-16 ENCOUNTER — Encounter: Payer: Self-pay | Admitting: Pulmonary Disease

## 2022-12-16 ENCOUNTER — Other Ambulatory Visit: Payer: Self-pay | Admitting: *Deleted

## 2022-12-16 DIAGNOSIS — R918 Other nonspecific abnormal finding of lung field: Secondary | ICD-10-CM

## 2022-12-16 NOTE — Telephone Encounter (Signed)
Handicap placard placed in Dr. Matilde Bash sign folder to be completed when he returns to clinic 12/21/22.

## 2022-12-17 LAB — ALPHA-1 ANTITRYPSIN PHENOTYPE: A-1 Antitrypsin, Ser: 120 mg/dL (ref 83–199)

## 2022-12-17 LAB — IGE: IgE (Immunoglobulin E), Serum: 41 kU/L (ref <=114)

## 2022-12-24 ENCOUNTER — Other Ambulatory Visit (HOSPITAL_COMMUNITY): Payer: PPO

## 2022-12-24 ENCOUNTER — Ambulatory Visit (HOSPITAL_BASED_OUTPATIENT_CLINIC_OR_DEPARTMENT_OTHER)
Admission: RE | Admit: 2022-12-24 | Discharge: 2022-12-24 | Disposition: A | Discharge status: Home / Self Care | Payer: PPO | Source: Ambulatory Visit | Attending: Pulmonary Disease | Admitting: Pulmonary Disease

## 2022-12-24 DIAGNOSIS — J479 Bronchiectasis, uncomplicated: Secondary | ICD-10-CM | POA: Diagnosis not present

## 2022-12-24 DIAGNOSIS — R918 Other nonspecific abnormal finding of lung field: Secondary | ICD-10-CM

## 2022-12-24 DIAGNOSIS — J439 Emphysema, unspecified: Secondary | ICD-10-CM | POA: Diagnosis not present

## 2022-12-25 ENCOUNTER — Ambulatory Visit: Payer: PPO | Attending: Internal Medicine

## 2022-12-25 DIAGNOSIS — E785 Hyperlipidemia, unspecified: Secondary | ICD-10-CM | POA: Diagnosis not present

## 2022-12-28 LAB — HEPATIC FUNCTION PANEL
ALT: 31 IU/L (ref 0–32)
AST: 28 IU/L (ref 0–40)
Albumin: 4.3 g/dL (ref 3.9–4.9)
Alkaline Phosphatase: 88 IU/L (ref 44–121)
Bilirubin Total: 0.5 mg/dL (ref 0.0–1.2)
Bilirubin, Direct: 0.13 mg/dL (ref 0.00–0.40)
Total Protein: 5.9 g/dL — ABNORMAL LOW (ref 6.0–8.5)

## 2022-12-28 LAB — LIPID PANEL
Chol/HDL Ratio: 2.5 ratio (ref 0.0–4.4)
Cholesterol, Total: 121 mg/dL (ref 100–199)
HDL: 49 mg/dL (ref >39)
LDL Chol Calc (NIH): 56 mg/dL (ref 0–99)
Triglycerides: 79 mg/dL (ref 0–149)
VLDL Cholesterol Cal: 16 mg/dL (ref 5–40)

## 2022-12-31 ENCOUNTER — Encounter: Payer: Self-pay | Admitting: Pulmonary Disease

## 2022-12-31 DIAGNOSIS — R918 Other nonspecific abnormal finding of lung field: Secondary | ICD-10-CM

## 2022-12-31 NOTE — Telephone Encounter (Signed)
Mychart  message sent by pt: [name] [name]  P [facility] (supporting Praveen Mannam, MD)17 minutes ago (3:45 PM)    Hi- has Dr. Vaughan Browner been able to review/comment on my 12/25/22 CT scan? I am also wondering if I am able to obtain a handicap placard.  Thank you!     Dr. Vaughan Browner, please advise on the results of pt's recent CT scan.    In regards to the handicap placard application, prior message from 1/17 showed that one was filled out. Lattie Haw was out of the office 1/22 when Dr. Vaughan Browner was back at office. Lattie Haw, please advise if you believe we may need to fill out another one on pt or if you believe this was taken care of for pt when you were able to return to the clinic?

## 2023-01-01 NOTE — Telephone Encounter (Signed)
CT shows stable lung nodules and emphysema without any change from last scan. There are no new findings Please order follow up CT chest without contrast in 6 months  I have also filled out the hadicap appliation

## 2023-01-11 DIAGNOSIS — H35372 Puckering of macula, left eye: Secondary | ICD-10-CM | POA: Diagnosis not present

## 2023-01-15 ENCOUNTER — Ambulatory Visit
Admission: RE | Admit: 2023-01-15 | Discharge: 2023-01-15 | Disposition: A | Discharge status: Home / Self Care | Payer: PPO | Source: Ambulatory Visit | Attending: Internal Medicine | Admitting: Internal Medicine

## 2023-01-15 DIAGNOSIS — Z1231 Encounter for screening mammogram for malignant neoplasm of breast: Secondary | ICD-10-CM

## 2023-01-28 DIAGNOSIS — L821 Other seborrheic keratosis: Secondary | ICD-10-CM | POA: Diagnosis not present

## 2023-01-28 DIAGNOSIS — L438 Other lichen planus: Secondary | ICD-10-CM | POA: Diagnosis not present

## 2023-01-28 DIAGNOSIS — I788 Other diseases of capillaries: Secondary | ICD-10-CM | POA: Diagnosis not present

## 2023-01-28 DIAGNOSIS — L738 Other specified follicular disorders: Secondary | ICD-10-CM | POA: Diagnosis not present

## 2023-01-28 DIAGNOSIS — D1801 Hemangioma of skin and subcutaneous tissue: Secondary | ICD-10-CM | POA: Diagnosis not present

## 2023-01-28 DIAGNOSIS — D2371 Other benign neoplasm of skin of right lower limb, including hip: Secondary | ICD-10-CM | POA: Diagnosis not present

## 2023-01-28 DIAGNOSIS — L812 Freckles: Secondary | ICD-10-CM | POA: Diagnosis not present

## 2023-02-05 ENCOUNTER — Encounter (HOSPITAL_BASED_OUTPATIENT_CLINIC_OR_DEPARTMENT_OTHER): Payer: Self-pay | Admitting: Internal Medicine

## 2023-02-05 ENCOUNTER — Ambulatory Visit (INDEPENDENT_AMBULATORY_CARE_PROVIDER_SITE_OTHER): Payer: PPO | Admitting: Internal Medicine

## 2023-02-05 VITALS — BP 127/73 | HR 67 | Ht 67.5 in | Wt 198.0 lb

## 2023-02-05 DIAGNOSIS — I700 Atherosclerosis of aorta: Secondary | ICD-10-CM | POA: Diagnosis not present

## 2023-02-05 DIAGNOSIS — E7841 Elevated Lipoprotein(a): Secondary | ICD-10-CM | POA: Diagnosis not present

## 2023-02-05 DIAGNOSIS — I2584 Coronary atherosclerosis due to calcified coronary lesion: Secondary | ICD-10-CM | POA: Diagnosis not present

## 2023-02-05 DIAGNOSIS — E785 Hyperlipidemia, unspecified: Secondary | ICD-10-CM | POA: Diagnosis not present

## 2023-02-05 DIAGNOSIS — I2510 Atherosclerotic heart disease of native coronary artery without angina pectoris: Secondary | ICD-10-CM

## 2023-02-05 NOTE — Progress Notes (Signed)
LIPID CLINIC CONSULT NOTE  Chief Complaint:  Manage dyslipidemia  Primary Care Physician: [name], MD  Primary Cardiologist:  Early Osmond, MD  HPI:  [name] [name] is a 67 y.o. female who is being seen today for the evaluation of dyslipidemia at the request of Thukkani, Arlie Solomons, MD. this is a pleasant 67 year old female female kindly referred for evaluation management of dyslipidemia.  More specifically she was found to have a high LP(a) recently at 326.1 nmol/L.  She was on rosuvastatin but had had some side effects with that it was switched to atorvastatin.  Recent lipids on therapy showed total cholesterol 121, triglycerides 79, HDL 49 and LDL 56.  She is also on ezetimibe.  She has no known cardiovascular disease but does have some aortic atherosclerosis and coronary artery calcification that has been seen on CT scans of the chest as provided by pulmonary.  He was also found to have an aortic aneurysm which is being monitored by Dr. Casey Burkitt.   PMHx:  Past Medical History:  Diagnosis Date   Arthritis    right knee, left hip   Carpal tunnel syndrome of left wrist 05/2017   COPD, severe (HCC)    SOB with ADLs per pt. - no O2   Cubital tunnel syndrome on left 05/2017   Dental crowns present    also dental implants   Esophageal reflux    Family history of adverse reaction to anesthesia    pt's mother has hx. of agitation and hallucinations post-op   History of asthma    as a child   Hypothyroidism (acquired)    Tachycardia    Holter monitor 06/08/2017 - 06/10/2017:  does not have results yet    Past Surgical History:  Procedure Laterality Date   BREAST LUMPECTOMY Left    CARPAL TUNNEL RELEASE Left 06/17/2017   Procedure: CARPAL TUNNEL RELEASE;  Surgeon: Daryll Brod, MD;  Location: [facility];  Service: Orthopedics;  Laterality: Left;   CATARACT EXTRACTION W/ INTRAOCULAR LENS IMPLANT Left    MASS EXCISION Left 03/26/2005   thumb   MULTIPLE  TOOTH EXTRACTIONS     THYROIDECTOMY, PARTIAL     TONSILLECTOMY     ULNAR NERVE TRANSPOSITION Left 06/17/2017   Procedure: DECOMPRESSION  ULNAR NERVE LEFT ELBOW;  Surgeon: Daryll Brod, MD;  Location: [facility];  Service: Orthopedics;  Laterality: Left;    FAMHx:  Family History  Problem Relation Age of Onset   Colon cancer Mother    Breast cancer Maternal Aunt    Allergies Brother     SOCHx:   reports that she quit smoking about 10 years ago. Her smoking use included cigarettes. She started smoking about 19 years ago. She has never used smokeless tobacco. She reports current alcohol use. She reports that she does not use drugs.  ALLERGIES:  Allergies  Allergen Reactions   Peanut-Containing Drug Products Shortness Of Breath    [place] NUTS   Codeine Other (See Comments)    UNKNOWN   Other Other (See Comments)    ROS: Pertinent items noted in HPI and remainder of comprehensive ROS otherwise negative.  HOME MEDS: Current Outpatient Medications on File Prior to Visit  Medication Sig Dispense Refill   albuterol (VENTOLIN HFA) 108 (90 Base) MCG/ACT inhaler Inhale 2 puffs into the lungs every 4 (four) hours as needed for wheezing or shortness of breath.     amLODipine (NORVASC) 2.5 MG tablet Take 2.5 mg by  mouth daily.     aspirin EC 81 MG tablet Take 81 mg by mouth daily. Swallow whole.     atorvastatin (LIPITOR) 40 MG tablet Take 1 tablet (40 mg total) by mouth daily. 90 tablet 3   cholecalciferol (VITAMIN D) 1000 units tablet Take 1,000 Units by mouth daily.     ezetimibe (ZETIA) 10 MG tablet Take 1 tablet (10 mg total) by mouth daily. 90 tablet 3   levothyroxine (SYNTHROID) 100 MCG tablet Take 1 tablet (100 mcg total) by mouth daily in the morning on an empty stomach 90 tablet 4   losartan (COZAAR) 25 MG tablet Take 25 mg by mouth daily.     Multiple Vitamin (MULTIVITAMIN) tablet Take 1 tablet by mouth daily.     omeprazole (PRILOSEC) 20 MG capsule Take 20 mg by  mouth 2 (two) times daily.     TRELEGY ELLIPTA 100-62.5-25 MCG/ACT AEPB Inhale 1 puff into the lungs daily. 180 each 1   No current facility-administered medications on file prior to visit.    LABS/IMAGING: No results found for this or any previous visit (from the past 48 hour(s)). No results found.  LIPID PANEL:    Component Value Date/Time   CHOL 121 12/25/2022 0907   TRIG 79 12/25/2022 0907   HDL 49 12/25/2022 0907   CHOLHDL 2.5 12/25/2022 0907   LDLCALC 56 12/25/2022 0907    WEIGHTS: Wt Readings from Last 3 Encounters:  02/05/23 198 lb (89.8 kg)  12/10/22 198 lb (89.8 kg)  10/26/22 196 lb (88.9 kg)    VITALS: BP 127/73   Pulse 67   Ht 5' 7.5" (1.715 m)   Wt 198 lb (89.8 kg)   BMI 30.55 kg/m   EXAM: Deferred  EKG: Deferred  ASSESSMENT: Mixed dyslipidemia, goal LDL less than 70 Aortic atherosclerosis and coronary artery calcification Ascending aortic aneurysm Elevated LP(a) at 326.1 nmol/L  PLAN: 1.   Ms. [name] [name] has a mixed dyslipidemia and is at target LDL now on combination of statin and ezetimibe.  Initially she was having some side effects with the rosuvastatin but it seems to have improved on atorvastatin although she does get some muscle pains they are more tolerable.  Since she is at target it is unlikely that she would qualify for PCSK9 inhibitor.  If however she would not tolerate a statin 1 could consider that as it would give her some benefit with regards to LP(a) lowering.  Otherwise options for LP(a) lowering are limited at this time.  Hopefully will have another clinical trial option for her.  For now I will continue her current therapy after we had some discussion she is in agreement with that.  Thanks again for the kind referral.  Pixie Casino, MD, FACC, [place] Director of the [facility]  [facility] Diplomate of the American Board of Clinical  Lipidology Attending Cardiologist  Direct Dial: [phone]  Fax: [phone]  Website:  [name] 02/05/2023, 12:44 PM

## 2023-02-05 NOTE — Patient Instructions (Addendum)
Medication Instructions:  Continue current medications  *If you need a refill on your cardiac medications before your next appointment, please call your pharmacy*   Lab Work: None Ordered  If you have labs (blood work) drawn today and your tests are completely normal, you will receive your results only by: [place] (if you have MyChart) OR A paper copy in the mail If you have any lab test that is abnormal or we need to change your treatment, we will call you to review the results.   Testing/Procedures: None Ordered   Follow-Up: At [facility], you and your health needs are our priority.  As part of our continuing mission to provide you with exceptional heart care, we have created designated Provider Care Teams.  These Care Teams include your primary Cardiologist (physician) and Advanced Practice Providers (APPs -  Physician Assistants and Nurse Practitioners) who all work together to provide you with the care you need, when you need it.  We recommend signing up for the patient portal called "MyChart".  Sign up information is provided on this After Visit Summary.  MyChart is used to connect with patients for Virtual Visits (Telemedicine).  Patients are able to view lab/test results, encounter notes, upcoming appointments, etc.  Non-urgent messages can be sent to your provider as well.   To learn more about what you can do with MyChart, go to [email or web address].    Your next appointment:   As Needed

## 2023-02-11 ENCOUNTER — Encounter: Payer: Self-pay | Admitting: Pulmonary Disease

## 2023-02-11 NOTE — Telephone Encounter (Signed)
We can cancel the April CT scan and keep the one that was ordered by me ordered for 6 months.  Thank you.

## 2023-02-11 NOTE — Telephone Encounter (Signed)
Dr. Vaughan Browner, can you confirm the super D CT chest scheduled for 03/02/23 needs to be cancelled. Per your last note from the 12/24/22 CT Chest results you wanted a 6 month repeat. Just wanted to confirm as it is ordered by a different provider and a super D scan. Thanks.

## 2023-02-23 ENCOUNTER — Other Ambulatory Visit: Payer: Self-pay

## 2023-02-24 ENCOUNTER — Other Ambulatory Visit: Payer: Self-pay

## 2023-02-25 ENCOUNTER — Other Ambulatory Visit: Payer: Self-pay

## 2023-03-01 ENCOUNTER — Other Ambulatory Visit (HOSPITAL_COMMUNITY): Payer: Self-pay

## 2023-03-02 ENCOUNTER — Ambulatory Visit (HOSPITAL_BASED_OUTPATIENT_CLINIC_OR_DEPARTMENT_OTHER): Payer: PPO

## 2023-03-03 ENCOUNTER — Other Ambulatory Visit: Payer: Self-pay

## 2023-03-04 ENCOUNTER — Other Ambulatory Visit: Payer: Self-pay

## 2023-03-04 ENCOUNTER — Other Ambulatory Visit (HOSPITAL_COMMUNITY): Payer: Self-pay

## 2023-04-06 ENCOUNTER — Other Ambulatory Visit (HOSPITAL_COMMUNITY): Payer: Self-pay

## 2023-04-06 MED ORDER — ATORVASTATIN CALCIUM 40 MG PO TABS
40.0000 mg | ORAL_TABLET | Freq: Every day | ORAL | 3 refills | Status: DC
  Filled 2023-04-06: qty 90, 90d supply, fill #0
  Filled 2023-07-01: qty 90, 90d supply, fill #1

## 2023-04-06 MED ORDER — LOSARTAN POTASSIUM 25 MG PO TABS
25.0000 mg | ORAL_TABLET | Freq: Every day | ORAL | 3 refills | Status: DC
  Filled 2023-04-06: qty 90, 90d supply, fill #0
  Filled 2023-07-01: qty 90, 90d supply, fill #1
  Filled 2023-09-29: qty 90, 90d supply, fill #2
  Filled 2023-12-28: qty 90, 90d supply, fill #3

## 2023-04-06 MED ORDER — AMLODIPINE BESYLATE 2.5 MG PO TABS
2.5000 mg | ORAL_TABLET | Freq: Every day | ORAL | 3 refills | Status: DC
  Filled 2023-04-06: qty 90, 90d supply, fill #0
  Filled 2023-07-01: qty 90, 90d supply, fill #1
  Filled 2023-09-29: qty 90, 90d supply, fill #2
  Filled 2023-12-28: qty 90, 90d supply, fill #3

## 2023-04-06 MED ORDER — EZETIMIBE 10 MG PO TABS
10.0000 mg | ORAL_TABLET | Freq: Every day | ORAL | 3 refills | Status: DC
  Filled 2023-04-06 – 2023-04-12 (×2): qty 90, 90d supply, fill #0

## 2023-04-07 ENCOUNTER — Ambulatory Visit: Payer: PPO | Admitting: Pulmonary Disease

## 2023-04-07 ENCOUNTER — Other Ambulatory Visit: Payer: Self-pay

## 2023-04-07 ENCOUNTER — Other Ambulatory Visit (HOSPITAL_COMMUNITY): Payer: Self-pay

## 2023-04-07 ENCOUNTER — Encounter: Payer: Self-pay | Admitting: Pulmonary Disease

## 2023-04-07 VITALS — BP 120/80 | HR 61 | Ht 67.5 in | Wt 195.8 lb

## 2023-04-07 DIAGNOSIS — J441 Chronic obstructive pulmonary disease with (acute) exacerbation: Secondary | ICD-10-CM

## 2023-04-07 MED ORDER — PREDNISONE 20 MG PO TABS: 40.0000 mg | ORAL_TABLET | Freq: Every day | ORAL | 0 refills | Status: DC

## 2023-04-07 NOTE — Progress Notes (Signed)
Synopsis: Referred in May 2024 for SOB by Emilio Aspen, *  Subjective:   PATIENT ID: [name] [name] GENDER: female DOB: November 17, 1956, MRN: [id number]  Chief Complaint  Patient presents with   Acute Visit    SOB, clear phlegm.    This is a 67 year old female, past medical history of asthma, hypothyroidism, severe COPD.  Had multiple pulmonary nodules that have been followed with serial surveillance imaging.  They have been stable.Last CT imaging was in January 2024.  Showed severe emphysematous changes multiple nodules within the chest some with early cavitation.  Unchanged from prior CT imaging in October 2023.  OV 04/07/2023: Here today with increased cough sputum production, shortness of breath.  She is using her albuterol inhaler 2-3 times a day.  Concern for ongoing exacerbation.    Past Medical History:  Diagnosis Date   Arthritis    right knee, left hip   Carpal tunnel syndrome of left wrist 05/2017   COPD, severe (HCC)    SOB with ADLs per pt. - no O2   Cubital tunnel syndrome on left 05/2017   Dental crowns present    also dental implants   Esophageal reflux    Family history of adverse reaction to anesthesia    pt's mother has hx. of agitation and hallucinations post-op   History of asthma    as a child   Hypothyroidism (acquired)    Tachycardia    Holter monitor 06/08/2017 - 06/10/2017:  does not have results yet     Family History  Problem Relation Age of Onset   Colon cancer Mother    Breast cancer Maternal Aunt    Allergies Brother      Past Surgical History:  Procedure Laterality Date   BREAST LUMPECTOMY Left    CARPAL TUNNEL RELEASE Left 06/17/2017   Procedure: CARPAL TUNNEL RELEASE;  Surgeon: Cindee Salt, MD;  Location: [facility];  Service: Orthopedics;  Laterality: Left;   CATARACT EXTRACTION W/ INTRAOCULAR LENS IMPLANT Left    MASS EXCISION Left 03/26/2005   thumb   MULTIPLE TOOTH EXTRACTIONS     THYROIDECTOMY, PARTIAL      TONSILLECTOMY     ULNAR NERVE TRANSPOSITION Left 06/17/2017   Procedure: DECOMPRESSION  ULNAR NERVE LEFT ELBOW;  Surgeon: Cindee Salt, MD;  Location: [facility];  Service: Orthopedics;  Laterality: Left;    Social History   Socioeconomic History   Marital status: Married    Spouse name: Not on file   Number of children: Not on file   Years of education: Not on file   Highest education level: Not on file  Occupational History   Occupation: Emergency planning/management officer for [employer]    Employer: [employer]  Tobacco Use   Smoking status: Former    Packs/day: 0.00    Years: 30.00    Additional pack years: 0.00    Total pack years: 0.00    Types: Cigarettes    Start date: 12/01/2003    Quit date: 01/06/2013    Years since quitting: 10.2   Smokeless tobacco: Never  Vaping Use   Vaping Use: Never used  Substance and Sexual Activity   Alcohol use: Yes    Comment: 2 beers per week   Drug use: No   Sexual activity: Yes  Other Topics Concern   Not on file  Social History Narrative   Not on file   Social Determinants of Health   Financial Resource Strain: Not on file  Food Insecurity: Not on file  Transportation Needs: Not on file  Physical Activity: Not on file  Stress: Not on file  Social Connections: Not on file  Intimate Partner Violence: Not on file     Allergies  Allergen Reactions   Peanut-Containing Drug Products Shortness Of Breath    [place] NUTS   Codeine Other (See Comments)    UNKNOWN   Other Other (See Comments)     Outpatient Medications Prior to Visit  Medication Sig Dispense Refill   albuterol (VENTOLIN HFA) 108 (90 Base) MCG/ACT inhaler Inhale 2 puffs into the lungs every 4 (four) hours as needed for wheezing or shortness of breath.     amLODipine (NORVASC) 2.5 MG tablet Take 1 tablet (2.5 mg total) by mouth daily. 90 tablet 3   aspirin EC 81 MG tablet Take 81 mg by mouth daily. Swallow whole.     atorvastatin (LIPITOR) 40 MG tablet Take 1 tablet  (40 mg total) by mouth daily. 90 tablet 3   cholecalciferol (VITAMIN D) 1000 units tablet Take 1,000 Units by mouth daily.     ezetimibe (ZETIA) 10 MG tablet Take 1 tablet (10 mg total) by mouth daily. 90 tablet 3   levothyroxine (SYNTHROID) 100 MCG tablet Take 1 tablet (100 mcg total) by mouth daily in the morning on an empty stomach 90 tablet 4   losartan (COZAAR) 25 MG tablet Take 1 tablet (25 mg total) by mouth daily. 90 tablet 3   Multiple Vitamin (MULTIVITAMIN) tablet Take 1 tablet by mouth daily.     omeprazole (PRILOSEC) 20 MG capsule Take 20 mg by mouth 2 (two) times daily.     TRELEGY ELLIPTA 100-62.5-25 MCG/ACT AEPB Inhale 1 puff into the lungs daily. 180 each 1   amLODipine (NORVASC) 2.5 MG tablet Take 2.5 mg by mouth daily.     atorvastatin (LIPITOR) 40 MG tablet Take 1 tablet (40 mg total) by mouth daily. 90 tablet 3   ezetimibe (ZETIA) 10 MG tablet Take 1 tablet (10 mg total) by mouth daily. 90 tablet 3   losartan (COZAAR) 25 MG tablet Take 25 mg by mouth daily.     No facility-administered medications prior to visit.    Review of Systems  Constitutional:  Negative for chills, fever, malaise/fatigue and weight loss.  HENT:  Negative for hearing loss, sore throat and tinnitus.   Eyes:  Negative for blurred vision and double vision.  Respiratory:  Positive for cough, sputum production and shortness of breath. Negative for hemoptysis, wheezing and stridor.   Cardiovascular:  Negative for chest pain, palpitations, orthopnea, leg swelling and PND.  Gastrointestinal:  Negative for abdominal pain, constipation, diarrhea, heartburn, nausea and vomiting.  Genitourinary:  Negative for dysuria, hematuria and urgency.  Musculoskeletal:  Negative for joint pain and myalgias.  Skin:  Negative for itching and rash.  Neurological:  Negative for dizziness, tingling, weakness and headaches.  Endo/Heme/Allergies:  Negative for environmental allergies. Does not bruise/bleed easily.   Psychiatric/Behavioral:  Negative for depression. The patient is not nervous/anxious and does not have insomnia.   All other systems reviewed and are negative.    Objective:  Physical Exam Vitals reviewed.  Constitutional:      General: She is not in acute distress.    Appearance: She is well-developed.  HENT:     Head: Normocephalic and atraumatic.  Eyes:     General: No scleral icterus.    Conjunctiva/sclera: Conjunctivae normal.     Pupils: Pupils are equal, round, and  reactive to light.  Neck:     Vascular: No JVD.     Trachea: No tracheal deviation.  Cardiovascular:     Rate and Rhythm: Normal rate and regular rhythm.     Heart sounds: Normal heart sounds. No murmur heard. Pulmonary:     Effort: Pulmonary effort is normal. No tachypnea, accessory muscle usage or respiratory distress.     Breath sounds: No stridor. No wheezing, rhonchi or rales.     Comments: Severely diminished breath sounds bilaterally Abdominal:     General: There is no distension.     Palpations: Abdomen is soft.     Tenderness: There is no abdominal tenderness.  Musculoskeletal:        General: No tenderness.     Cervical back: Neck supple.  Lymphadenopathy:     Cervical: No cervical adenopathy.  Skin:    General: Skin is warm and dry.     Capillary Refill: Capillary refill takes less than 2 seconds.     Findings: No rash.  Neurological:     Mental Status: She is alert and oriented to person, place, and time.  Psychiatric:        Behavior: Behavior normal.      Vitals:   04/07/23 0952  BP: 120/80  Pulse: 61  SpO2: 97%  Weight: 195 lb 12.8 oz (88.8 kg)  Height: 5' 7.5" (1.715 m)   97% on RA BMI Readings from Last 3 Encounters:  04/07/23 30.21 kg/m  02/05/23 30.55 kg/m  12/10/22 30.55 kg/m   Wt Readings from Last 3 Encounters:  04/07/23 195 lb 12.8 oz (88.8 kg)  02/05/23 198 lb (89.8 kg)  12/10/22 198 lb (89.8 kg)     CBC    Component Value Date/Time   WBC 6.9  12/10/2022 0902   RBC 4.50 12/10/2022 0902   HGB 13.9 12/10/2022 0902   HCT 42.2 12/10/2022 0902   PLT 320.0 12/10/2022 0902   MCV 93.7 12/10/2022 0902   MCH 31.6 05/20/2021 1312   MCHC 32.9 12/10/2022 0902   RDW 14.8 12/10/2022 0902   LYMPHSABS 2.6 12/10/2022 0902   MONOABS 0.6 12/10/2022 0902   EOSABS 0.2 12/10/2022 0902   BASOSABS 0.0 12/10/2022 0902      Chest Imaging:  CT chest January 2024: Severe emphysema, nodule stable. The patient's images have been independently reviewed by me.    Pulmonary Functions Testing Results:    Latest Ref Rng & Units 12/12/2021    8:40 AM  PFT Results  FVC-Pre L 2.70   FVC-Predicted Pre % 75   FVC-Post L 2.94   FVC-Predicted Post % 81   Pre FEV1/FVC % % 46   Post FEV1/FCV % % 47   FEV1-Pre L 1.26   FEV1-Predicted Pre % 45   FEV1-Post L 1.39   DLCO uncorrected ml/min/mmHg 13.74   DLCO UNC% % 61   DLCO corrected ml/min/mmHg 13.74   DLCO COR %Predicted % 61   DLVA Predicted % 63   TLC L 7.23   TLC % Predicted % 127   RV % Predicted % 151     FeNO:   Pathology:   Echocardiogram:   Heart Catheterization:     Assessment & Plan:     ICD-10-CM   1. COPD with acute exacerbation (HCC)  J44.1       Discussion:  Patient with severe COPD, significant emphysema on CT imaging presenting with acute exacerbation symptoms increase shortness of breath increased albuterol use, increased pedal production.  Plan: No other acute signs of infection we will hold off on antibiotics at this time. Will give prednisone 40 mg x 5 days. She is on call and let us know if anything else changes or symptoms get worse. Low threshold to start antibiotics if they do. She is on call and let us know. Otherwise keep routine follow-up as scheduled with SG, NP or Dr. Isaiah Serge.   Current Outpatient Medications:    albuterol (VENTOLIN HFA) 108 (90 Base) MCG/ACT inhaler, Inhale 2 puffs into the lungs every 4 (four) hours as needed for wheezing or  shortness of breath., Disp: , Rfl:    amLODipine (NORVASC) 2.5 MG tablet, Take 1 tablet (2.5 mg total) by mouth daily., Disp: 90 tablet, Rfl: 3   aspirin EC 81 MG tablet, Take 81 mg by mouth daily. Swallow whole., Disp: , Rfl:    atorvastatin (LIPITOR) 40 MG tablet, Take 1 tablet (40 mg total) by mouth daily., Disp: 90 tablet, Rfl: 3   cholecalciferol (VITAMIN D) 1000 units tablet, Take 1,000 Units by mouth daily., Disp: , Rfl:    ezetimibe (ZETIA) 10 MG tablet, Take 1 tablet (10 mg total) by mouth daily., Disp: 90 tablet, Rfl: 3   levothyroxine (SYNTHROID) 100 MCG tablet, Take 1 tablet (100 mcg total) by mouth daily in the morning on an empty stomach, Disp: 90 tablet, Rfl: 4   losartan (COZAAR) 25 MG tablet, Take 1 tablet (25 mg total) by mouth daily., Disp: 90 tablet, Rfl: 3   Multiple Vitamin (MULTIVITAMIN) tablet, Take 1 tablet by mouth daily., Disp: , Rfl:    omeprazole (PRILOSEC) 20 MG capsule, Take 20 mg by mouth 2 (two) times daily., Disp: , Rfl:    predniSONE (DELTASONE) 20 MG tablet, Take 2 tablets (40 mg total) by mouth daily with breakfast for 5 days., Disp: 10 tablet, Rfl: 0   TRELEGY ELLIPTA 100-62.5-25 MCG/ACT AEPB, Inhale 1 puff into the lungs daily., Disp: 180 each, Rfl: 1   Josephine Igo, DO  Pulmonary Critical Care 04/07/2023 10:16 AM

## 2023-04-07 NOTE — Patient Instructions (Signed)
Thank you for visiting Dr. Tonia Brooms at [facility]. Today we recommend the following:  Meds ordered this encounter  Medications   predniSONE (DELTASONE) 20 MG tablet    Sig: Take 2 tablets (40 mg total) by mouth daily with breakfast for 5 days.    Dispense:  10 tablet    Refill:  0   Return if symptoms worsen or fail to improve.    Please do your part to reduce the spread of COVID-19.

## 2023-04-12 ENCOUNTER — Encounter: Payer: Self-pay | Admitting: Pulmonary Disease

## 2023-04-12 ENCOUNTER — Other Ambulatory Visit: Payer: Self-pay

## 2023-04-12 DIAGNOSIS — J441 Chronic obstructive pulmonary disease with (acute) exacerbation: Secondary | ICD-10-CM

## 2023-04-12 MED ORDER — DOXYCYCLINE HYCLATE 100 MG PO TABS: 100.0000 mg | ORAL_TABLET | Freq: Two times a day (BID) | ORAL | 0 refills | Status: AC

## 2023-04-12 MED ORDER — PREDNISONE 10 MG PO TABS: ORAL_TABLET | ORAL | 0 refills | Status: DC

## 2023-04-12 NOTE — Telephone Encounter (Signed)
Katie send rxs as stated in her msg to the pt  I called the pt to go ahead and let her know of the recommendations  She verbalized understanding

## 2023-04-12 NOTE — Telephone Encounter (Signed)
Called and spoke with the pt She states that her chest tightness has improved after recent pred taper but she has started to cough up more yellow sputum past few days  She has not been wheezing  She feels that her breathing is not back to baseline bc she is getting winded walking to mailbox and this is not like her  She is wondering if she needs abx  She asked that we send msg to provider since just seen 04/07/23   Dr Isaiah Serge, please advise, thanks!  Allergies  Allergen Reactions   Peanut-Containing Drug Products Shortness Of Breath    [place] NUTS   Codeine Other (See Comments)    UNKNOWN   Other Other (See Comments)

## 2023-04-13 DIAGNOSIS — Z01419 Encounter for gynecological examination (general) (routine) without abnormal findings: Secondary | ICD-10-CM | POA: Diagnosis not present

## 2023-04-13 DIAGNOSIS — Z124 Encounter for screening for malignant neoplasm of cervix: Secondary | ICD-10-CM | POA: Diagnosis not present

## 2023-04-13 DIAGNOSIS — Z01411 Encounter for gynecological examination (general) (routine) with abnormal findings: Secondary | ICD-10-CM | POA: Diagnosis not present

## 2023-04-13 DIAGNOSIS — Z780 Asymptomatic menopausal state: Secondary | ICD-10-CM | POA: Diagnosis not present

## 2023-04-13 DIAGNOSIS — M8580 Other specified disorders of bone density and structure, unspecified site: Secondary | ICD-10-CM | POA: Diagnosis not present

## 2023-04-13 DIAGNOSIS — E7800 Pure hypercholesterolemia, unspecified: Secondary | ICD-10-CM | POA: Diagnosis not present

## 2023-04-13 DIAGNOSIS — Z6831 Body mass index (BMI) 31.0-31.9, adult: Secondary | ICD-10-CM | POA: Diagnosis not present

## 2023-04-16 ENCOUNTER — Other Ambulatory Visit (HOSPITAL_COMMUNITY): Payer: Self-pay

## 2023-04-27 DIAGNOSIS — K909 Intestinal malabsorption, unspecified: Secondary | ICD-10-CM | POA: Diagnosis not present

## 2023-04-27 DIAGNOSIS — Z23 Encounter for immunization: Secondary | ICD-10-CM | POA: Diagnosis not present

## 2023-04-27 DIAGNOSIS — Z79899 Other long term (current) drug therapy: Secondary | ICD-10-CM | POA: Diagnosis not present

## 2023-04-27 DIAGNOSIS — J449 Chronic obstructive pulmonary disease, unspecified: Secondary | ICD-10-CM | POA: Diagnosis not present

## 2023-04-27 DIAGNOSIS — K219 Gastro-esophageal reflux disease without esophagitis: Secondary | ICD-10-CM | POA: Diagnosis not present

## 2023-04-27 DIAGNOSIS — I700 Atherosclerosis of aorta: Secondary | ICD-10-CM | POA: Diagnosis not present

## 2023-04-27 DIAGNOSIS — E039 Hypothyroidism, unspecified: Secondary | ICD-10-CM | POA: Diagnosis not present

## 2023-04-27 DIAGNOSIS — I10 Essential (primary) hypertension: Secondary | ICD-10-CM | POA: Diagnosis not present

## 2023-04-27 DIAGNOSIS — I712 Thoracic aortic aneurysm, without rupture, unspecified: Secondary | ICD-10-CM | POA: Diagnosis not present

## 2023-04-27 DIAGNOSIS — Z0000 Encounter for general adult medical examination without abnormal findings: Secondary | ICD-10-CM | POA: Diagnosis not present

## 2023-04-27 DIAGNOSIS — E7800 Pure hypercholesterolemia, unspecified: Secondary | ICD-10-CM | POA: Diagnosis not present

## 2023-04-28 ENCOUNTER — Other Ambulatory Visit (HOSPITAL_COMMUNITY): Payer: Self-pay

## 2023-04-29 ENCOUNTER — Other Ambulatory Visit: Payer: Self-pay

## 2023-04-29 MED ORDER — OMEPRAZOLE 20 MG PO CPDR
20.0000 mg | DELAYED_RELEASE_CAPSULE | Freq: Every day | ORAL | 3 refills | Status: DC
  Filled 2023-04-29: qty 90, 90d supply, fill #0

## 2023-04-30 ENCOUNTER — Other Ambulatory Visit (HOSPITAL_COMMUNITY): Payer: Self-pay

## 2023-04-30 ENCOUNTER — Other Ambulatory Visit: Payer: Self-pay

## 2023-04-30 MED ORDER — OMEPRAZOLE 20 MG PO CPDR
20.0000 mg | DELAYED_RELEASE_CAPSULE | Freq: Every day | ORAL | 3 refills | Status: DC
  Filled 2023-04-30 – 2023-07-23 (×3): qty 90, 90d supply, fill #0
  Filled 2023-10-14: qty 90, 90d supply, fill #1
  Filled 2024-01-19: qty 90, 90d supply, fill #2
  Filled 2024-04-10: qty 90, 90d supply, fill #3

## 2023-05-07 ENCOUNTER — Other Ambulatory Visit: Payer: Self-pay | Admitting: Thoracic Surgery (Cardiothoracic Vascular Surgery)

## 2023-05-07 DIAGNOSIS — I7121 Aneurysm of the ascending aorta, without rupture: Secondary | ICD-10-CM

## 2023-05-10 ENCOUNTER — Other Ambulatory Visit (HOSPITAL_COMMUNITY): Payer: Self-pay

## 2023-05-12 ENCOUNTER — Other Ambulatory Visit (HOSPITAL_COMMUNITY): Payer: Self-pay

## 2023-05-12 ENCOUNTER — Other Ambulatory Visit: Payer: Self-pay | Admitting: Internal Medicine

## 2023-05-19 ENCOUNTER — Other Ambulatory Visit (HOSPITAL_COMMUNITY): Payer: Self-pay

## 2023-05-20 ENCOUNTER — Other Ambulatory Visit (HOSPITAL_COMMUNITY): Payer: Self-pay

## 2023-05-31 ENCOUNTER — Encounter: Payer: Self-pay | Admitting: Pharmacist

## 2023-05-31 ENCOUNTER — Other Ambulatory Visit (HOSPITAL_COMMUNITY): Payer: Self-pay

## 2023-05-31 ENCOUNTER — Other Ambulatory Visit: Payer: Self-pay

## 2023-06-01 ENCOUNTER — Other Ambulatory Visit (HOSPITAL_COMMUNITY): Payer: Self-pay

## 2023-06-02 ENCOUNTER — Other Ambulatory Visit (HOSPITAL_COMMUNITY): Payer: Self-pay

## 2023-06-04 ENCOUNTER — Other Ambulatory Visit (HOSPITAL_COMMUNITY): Payer: Self-pay

## 2023-06-09 ENCOUNTER — Other Ambulatory Visit (HOSPITAL_COMMUNITY): Payer: Self-pay

## 2023-06-18 ENCOUNTER — Telehealth: Payer: PPO | Admitting: Thoracic Surgery (Cardiothoracic Vascular Surgery)

## 2023-06-18 ENCOUNTER — Ambulatory Visit (INDEPENDENT_AMBULATORY_CARE_PROVIDER_SITE_OTHER): Payer: PPO | Admitting: Thoracic Surgery (Cardiothoracic Vascular Surgery)

## 2023-06-18 ENCOUNTER — Ambulatory Visit
Admission: RE | Admit: 2023-06-18 | Discharge: 2023-06-18 | Disposition: A | Discharge status: Home / Self Care | Payer: PPO | Source: Ambulatory Visit | Attending: Thoracic Surgery (Cardiothoracic Vascular Surgery) | Admitting: Thoracic Surgery (Cardiothoracic Vascular Surgery)

## 2023-06-18 DIAGNOSIS — I7121 Aneurysm of the ascending aorta, without rupture: Secondary | ICD-10-CM

## 2023-06-18 DIAGNOSIS — R918 Other nonspecific abnormal finding of lung field: Secondary | ICD-10-CM | POA: Diagnosis not present

## 2023-06-18 DIAGNOSIS — J439 Emphysema, unspecified: Secondary | ICD-10-CM | POA: Diagnosis not present

## 2023-06-18 DIAGNOSIS — I700 Atherosclerosis of aorta: Secondary | ICD-10-CM | POA: Diagnosis not present

## 2023-06-18 NOTE — Progress Notes (Signed)
Expand All Collapse All      [address]       [name] [address]             [phone]        Patient: Home Provider: Office Consent for Telemedicine visit obtained.   Today's visit was completed via a real-time telehealth (see specific modality noted below). The patient/authorized person provided oral consent at the time of the visit to engage in a telemedicine encounter with the present provider at [facility]. The patient/authorized person was informed of the potential benefits, limitations, and risks of telemedicine. The patient/authorized person expressed understanding that the laws that protect confidentiality also apply to telemedicine. The patient/authorized person acknowledged understanding that telemedicine does not provide emergency services and that he or she would need to call 911 or proceed to the nearest hospital for help if such a need arose.   Total time spent in the clinical discussion 10 minutes.  Telehealth Modality: Phone visit (audio only)   IMPRESSION: 1. Stable ascending aortic aneurysm, 4.3 x 4.6 centimeters. Recommend semi-annual imaging followup by CTA or MRA and referral to cardiothoracic surgery if not already obtained. This recommendation follows 2010 ACCF/AHA/AATS/ACR/ASA/SCA/SCAI/SIR/STS/SVM Guidelines for the Diagnosis and Management of Patients With Thoracic Aortic Disease. Circulation. 2010; 121: Z563-O756. Aortic aneurysm NOS (ICD10-I71.9) 2. Stable pulmonary nodules. Recommend continued surveillance in 6 months. 3. Aortic Atherosclerosis (ICD10-I70.0) and Emphysema (ICD10-J43.9).   I had a telephone visit with [name] [name]   Assessment:   68 year old female with a 4.6 cm ascending aortic aneurysm.  Echocardiogram shows a trileaflet valve without evidence of regurgitation.  We discussed the natural history and and risk factors for growth of ascending aortic aneurysms.  We covered the importance of smoking cessation, tight  blood pressure control, refraining from lifting heavy objects, and avoiding fluoroquinolones.  The patient is aware of signs and symptoms of aortic dissection and when to present to the emergency department.  We will continue surveillance and a repeat CT was ordered for 12 months.   She does have a family hx of aneurysm repair in her father in 2012.  Will recommend elective repair at 5cm, however may be prohibitive from a respiratory standpoint due to her severe COPD and bullous emphysema.  She is currently being treated by Dr. Silvestre Gunner.  Will coordinate surveillance CT Chests in the future.   Pulmonary nodules followed by Dr. Silvestre Gunner.  [name] [name] [name] [name] Scrape

## 2023-06-21 ENCOUNTER — Encounter: Payer: Self-pay | Admitting: Pulmonary Disease

## 2023-06-21 ENCOUNTER — Other Ambulatory Visit (HOSPITAL_COMMUNITY): Payer: Self-pay

## 2023-06-25 NOTE — Telephone Encounter (Signed)
I have received a following message from this pt   "Just a note to Dr. Isaiah Serge that I had a CT Scan on 7/19, which was arranged by my vascular surgeon. Please let me know of any issues on the scan regarding my lungs and/or any steps I need to take. Dr. Cliffton Asters only addressed the aneurysm. As information, I have been experiencing some difficulty breathing the last 3 weeks. Thanks for your review."

## 2023-06-28 NOTE — Telephone Encounter (Signed)
I called and spoke with patient. Patient scheduled 08/18/23 at 0930 with Dr. Isaiah Serge.

## 2023-06-28 NOTE — Telephone Encounter (Signed)
I called and discussed with patient.  His CT shows stable with no new worrisome findings Please make routine appointment to see me at next available.

## 2023-07-02 ENCOUNTER — Other Ambulatory Visit (HOSPITAL_COMMUNITY): Payer: Self-pay

## 2023-07-02 ENCOUNTER — Other Ambulatory Visit: Payer: Self-pay

## 2023-07-02 ENCOUNTER — Encounter (HOSPITAL_COMMUNITY): Payer: Self-pay

## 2023-07-10 ENCOUNTER — Other Ambulatory Visit (HOSPITAL_COMMUNITY): Payer: Self-pay

## 2023-07-11 ENCOUNTER — Encounter: Payer: Self-pay | Admitting: Internal Medicine

## 2023-07-11 ENCOUNTER — Encounter (INDEPENDENT_AMBULATORY_CARE_PROVIDER_SITE_OTHER): Payer: Self-pay

## 2023-07-12 ENCOUNTER — Other Ambulatory Visit (HOSPITAL_COMMUNITY): Payer: Self-pay

## 2023-07-12 MED ORDER — EZETIMIBE 10 MG PO TABS
10.0000 mg | ORAL_TABLET | Freq: Every day | ORAL | 1 refills | Status: DC
  Filled 2023-07-12: qty 90, 90d supply, fill #0
  Filled 2023-09-29: qty 90, 90d supply, fill #1

## 2023-07-23 ENCOUNTER — Other Ambulatory Visit (HOSPITAL_COMMUNITY): Payer: Self-pay

## 2023-07-23 ENCOUNTER — Other Ambulatory Visit: Payer: Self-pay

## 2023-07-27 ENCOUNTER — Other Ambulatory Visit (HOSPITAL_COMMUNITY): Payer: Self-pay

## 2023-07-30 ENCOUNTER — Other Ambulatory Visit (HOSPITAL_COMMUNITY): Payer: Self-pay

## 2023-07-30 MED ORDER — LEVOTHYROXINE SODIUM 100 MCG PO TABS
100.0000 ug | ORAL_TABLET | Freq: Every morning | ORAL | 3 refills | Status: DC
  Filled 2023-07-30: qty 90, 90d supply, fill #0
  Filled 2023-10-28: qty 90, 90d supply, fill #1
  Filled 2024-01-26: qty 90, 90d supply, fill #2
  Filled 2024-05-04: qty 90, 90d supply, fill #3

## 2023-07-31 ENCOUNTER — Other Ambulatory Visit (HOSPITAL_BASED_OUTPATIENT_CLINIC_OR_DEPARTMENT_OTHER): Payer: Self-pay

## 2023-08-18 ENCOUNTER — Encounter: Payer: Self-pay | Admitting: Pulmonary Disease

## 2023-08-18 ENCOUNTER — Ambulatory Visit: Payer: PPO | Admitting: Pulmonary Disease

## 2023-08-18 VITALS — BP 122/76 | HR 66 | Temp 97.6°F | Ht 67.5 in | Wt 197.2 lb

## 2023-08-18 DIAGNOSIS — J441 Chronic obstructive pulmonary disease with (acute) exacerbation: Secondary | ICD-10-CM

## 2023-08-18 DIAGNOSIS — R918 Other nonspecific abnormal finding of lung field: Secondary | ICD-10-CM

## 2023-08-18 MED ORDER — BREZTRI AEROSPHERE 160-9-4.8 MCG/ACT IN AERO: 2.0000 | INHALATION_SPRAY | Freq: Two times a day (BID) | RESPIRATORY_TRACT | Status: DC

## 2023-08-18 NOTE — Patient Instructions (Signed)
Will order a follow-up CT in July 2025 for lung nodules Will give samples of Breztri inhaler.  If you like this then give [place] a call and we can send in a prescription Follow-up in 6 months

## 2023-08-18 NOTE — Progress Notes (Signed)
[name] [name]    [id number]    1956/01/19  Primary Care Physician:Henderson, Sherie Don, MD  Referring Physician: Emilio Aspen, MD [address] [place],  [place] [address]  Chief complaint: Follow-up for COPD  HPI: 67 y.o.  with history of COPD.  Previously followed by Dr. Sherene Sires.  Has not been seen PCCM since 2013 Maintained on Spiriva.  Advair was started in June 2021 Complains of chronic dyspnea on exertion.  Occasional cough, no fevers or chills Other medical issues include allergies with postnasal drip, GERD for which she is on Prilosec twice daily.  She has occasional hoarseness of voice Evaluated by Dr. Jeananne Rama ENT in April 2021 with laryngoscope showing vocal cord edema consistent with LPR  History notable for thoracic aortic aneurysm which was noted during follow-up for lung nodules.  The aneurysm is slowly increasing in size.  She has been evaluated by cardiothoracic surgery with continued surveillance recommended.  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  No feather pillows or comforter Smoking history: 30-pack-year smoker.  Quit in 2007 Travel history: No significant travel history Relevant family history: No family history of lung disease  Interim history:  Discussed the use of AI scribe software for clinical note transcription with the patient, who gave verbal consent to proceed.  The patient, with a history of COPD, presents with worsening shortness of breath. She reports that she is now out of breath after walking to the end of her driveway to pick up the newspaper. About a month and a half ago, she experienced increased cough, chest congestion. However, these symptoms improved after she was put on Oxacillin for a tooth implant  removal at the beginning of the month. She is currently on the Trelegy inhaler with no changes. She has been using her emergency inhaler once every other day. She also has concerns about the COVID-19 vaccine, the flu shot, and the RSV shot.       Outpatient Encounter Medications as of 08/18/2023  Medication Sig   albuterol (VENTOLIN HFA) 108 (90 Base) MCG/ACT inhaler Inhale 2 puffs into the lungs every 4 (four) hours as needed for wheezing or shortness of breath.   amLODipine (NORVASC) 2.5 MG tablet Take 1 tablet (2.5 mg total) by mouth daily.   aspirin EC 81 MG tablet Take 81 mg by mouth daily. Swallow whole.   atorvastatin (LIPITOR) 40 MG tablet Take 1 tablet (40 mg total) by mouth daily.   cholecalciferol (VITAMIN D) 1000 units tablet Take 1,000 Units by mouth daily.   ezetimibe (ZETIA) 10 MG tablet Take 1 tablet (10 mg) by mouth daily.   levothyroxine (SYNTHROID) 100 MCG tablet Take 1 tablet (100 mcg total) by mouth in the morning on an empty stomach.   losartan (COZAAR) 25 MG tablet Take 1 tablet (25 mg total) by mouth daily.   Multiple Vitamin (MULTIVITAMIN) tablet Take 1 tablet by mouth daily.   omeprazole (PRILOSEC) 20 MG capsule Take 1 capsule (20 mg total) by mouth daily.   TRELEGY ELLIPTA 100-62.5-25 MCG/ACT AEPB Inhale 1 puff into the lungs daily.   [DISCONTINUED] levothyroxine (SYNTHROID) 100 MCG tablet Take 1 tablet (100 mcg total) by mouth daily in the morning on an empty stomach   No facility-administered encounter medications on  file as of 08/18/2023.    Physical Exam: Blood pressure 122/76, pulse 66, temperature 97.6 F (36.4 C), temperature source Oral, height 5' 7.5" (1.715 m), weight 197 lb 3.2 oz (89.4 kg), SpO2 99%. Gen:      No acute distress HEENT:  EOMI, sclera anicteric Neck:     No masses; no thyromegaly Lungs:    Clear to auscultation bilaterally; normal respiratory effort CV:         Regular rate and rhythm; no murmurs Abd:      + bowel sounds; soft,  non-tender; no palpable masses, no distension Ext:    No edema; adequate peripheral perfusion Skin:      Warm and dry; no rash Neuro: alert and oriented x 3 Psych: normal mood and affect   Data Reviewed: Imaging: CT chest 08/21/2021-stable thoracic aortic aneurysm measuring 4.7 cm, mild coronary artery calcification, severe emphysema.    CT chest 06/08/2022-waxing and waning areas of nodularity bilaterally, 4.5 cm ascending aortic aneurysm.  CT chest 09/14/2022- Continued improvement in size of lung nodules.  CT chest 06/18/2023-stable ascending aortic aneurysm, stable lung nodule I have reviewed the images personally.  PFTs: 08/16/2012 FVC 3.71 [96%], FEV1 1.76 [50%], F/F 48, TLC 7.09 [125%], DLCO 17.75 [59%]  12/12/2021 FVC 2.94 [81%], FEV1 1.39 [50%], F/F 47, TLC 7.23 [127%], DLCO 13.74 [61%] Severe obstruction with hyperinflation, air trapping.  Moderate diffusion impairment  Labs: CBC 05/20/2021-WBC 8.5, eos 7%, absolute eosinophil count 595 CBC 12/08/2022-WBC 6.9, eos 2.8%, absolute eosinophil count 193 IgE 12/10/2022-41 Alpha-1 antitrypsin 12/10/2022-120, PI MM  Assessment:  Severe COPD, asthma overlap syndrome May have asthmatic component of COPD given elevated peripheral eosinophils in the past.   We discussed change of therapy to [facility] or consideration of asthma biologics Will give samples of Breztri inhaler and if she likes it better than she will call in for a prescription  Lung nodules Will order follow-up CT in July 2025 for lung nodule  Thoracic aortic aneurysm Follow-up on CT chest   Plan/Recommendations: Give samples of Breztri CT in July 2025  Chilton Greathouse MD [facility] 08/18/2023, 9:45 AM  CC: Emilio Aspen, *

## 2023-08-31 NOTE — Progress Notes (Unsigned)
Cardiology Office Note:   Date:  09/01/2023  ID:  [name] [name], DOB 17-Jan-1956, MRN [id number] PCP:  Emilio Aspen, MD  [facility] Providers Cardiologist:  Alverda Skeans, MD Referring MD: Emilio Aspen, *   Chief Complaint/Reason for Referral: Cardiology follow-up ASSESSMENT:    1. Aneurysm of ascending aorta without rupture (HCC)   2. Coronary artery calcification seen on CAT scan   3. Primary hypertension   4. Elevated Lp(a)   5. Chronic obstructive pulmonary disease, unspecified COPD type (HCC)   6. Aortic atherosclerosis (HCC)   7. Palpitations   8. General medical examination     PLAN:   In order of problems listed above: 1.  Ascending aortic aneurysm: This is being followed by CT surgery. 2.  Hypertension: Blood pressure is well-controlled today. 3.  Elevated LP(a): Check lipid panel and LFTs today.  Goal LDL is less than 55. 4.  COPD: This is being followed by pulmonology. 6.  Aortic atherosclerosis: Continue aspirin, atorvastatin, and strict blood pressure control. 7.  Palpitations: Will check reflex TSH, CMP; the palpitations are relatively infrequent and not bothersome.  The patient will track her symptom burden and if required she will let us know about a monitor. 8.  General Medical examination: Will check hemoglobin A1c.       Dispo:  Return in about 6 months (around 03/01/2024).      Medication Adjustments/Labs and Tests Ordered: Current medicines are reviewed at length with the patient today.  Concerns regarding medicines are outlined above.  The following changes have been made:  no change   Labs/tests ordered: Orders Placed This Encounter  Procedures   Lipid panel   Comprehensive metabolic panel   Hemoglobin A1c   TSH Rfx on Abnormal to Free T4   EKG 12-Lead    Medication Changes: No orders of the defined types were placed in this encounter.   Current medicines are reviewed at length with the patient today.  The patient does  not have concerns regarding medicines.  History of Present Illness:    FOCUSED PROBLEM LIST:   Thoracic aortic ascending aneurysm around 4.6 to 4.7 cm upon echocardiography and CT scanning; followed by CT surgery Coronary artery calcification chest CT 2022 COPD with emphysema Hyperlipidemia with Lp(a) >300; myalgias in response crestor Hypertension  BMI 31 Aortic atherosclerosis chest CT 2022   November 2022: The patient was doing well and no changes were made to her medical regimen with plans for every 6 month CT evaluation for her aneurysm.   May 2023: The patient is doing well.  She would like to exercise on a stationary bike.  She denies any chest pain, palpitations, paroxysmal nocturnal dyspnea, orthopnea.  She has been completely compliant with her medications.  She was recently changed to rosuvastatin by her primary care provider.  She is tolerating this well.  Plan: Continue current therapy.   November 2023: The patient has been doing well.  She denies any chest pain, shortness of breath, signs or symptoms of stroke, paroxysmal nocturnal dyspnea, orthopnea.  She has some mild right ankle swelling.  She is not sure whether she dropped anything on her ankle.  She has had some myalgias in response to Crestor.  She fortunately has not required any emergency room visits or hospitalizations.  Plan: Due to myalgias stop Crestor start atorvastatin 40 mg; refer to Dr. Rennis Golden given elevated LP(a).  October 2024: She saw Dr. Rennis Golden in March and was tolerating Crestor well so no changes  were made to her medical regimen as her LDL was at 56.  She is doing very well.  She occasionally gets palpitations may be once every few weeks when she lays down to go to bed.  This will last a few minutes and then go away.  She does not develop palpitations at any other time.  She denies any exertional angina or exertional dyspnea.  She has had no issues with her cholesterol medications.  Interestingly she has  developed occasional night sweats.  These might happen once a month.  There does not seem to be any associated factors.  She denies any fevers or chills.  She has had no significant weight loss.  Additionally she has noticed some lower extremity ankle edema.  She has been on amlodipine for some time.  When queried more about this she says it is noticeable and not bothersome to her.  In regards to her blood pressure control the patient tells me her blood pressures at home are in the 110s to 120s systolic and usually less than 80 mmHg for the diastolic.     Current Medications: Current Meds  Medication Sig   albuterol (VENTOLIN HFA) 108 (90 Base) MCG/ACT inhaler Inhale 2 puffs into the lungs every 4 (four) hours as needed for wheezing or shortness of breath.   amLODipine (NORVASC) 2.5 MG tablet Take 1 tablet (2.5 mg total) by mouth daily.   aspirin EC 81 MG tablet Take 81 mg by mouth daily. Swallow whole.   atorvastatin (LIPITOR) 40 MG tablet Take 1 tablet (40 mg total) by mouth daily.   Budeson-Glycopyrrol-Formoterol (BREZTRI AEROSPHERE) 160-9-4.8 MCG/ACT AERO Inhale 2 puffs into the lungs in the morning and at bedtime.   cholecalciferol (VITAMIN D) 1000 units tablet Take 1,000 Units by mouth daily.   ezetimibe (ZETIA) 10 MG tablet Take 1 tablet (10 mg) by mouth daily.   levothyroxine (SYNTHROID) 100 MCG tablet Take 1 tablet (100 mcg total) by mouth in the morning on an empty stomach.   losartan (COZAAR) 25 MG tablet Take 1 tablet (25 mg total) by mouth daily.   Multiple Vitamin (MULTIVITAMIN) tablet Take 1 tablet by mouth daily.   omeprazole (PRILOSEC) 20 MG capsule Take 1 capsule (20 mg total) by mouth daily.      Review of Systems:   Please see the history of present illness.    All other systems reviewed and are negative.      EKGs/Labs/Other Test Reviewed:   EKG:    EKG Interpretation Date/Time:  Wednesday September 01 2023 09:31:36 EDT Ventricular Rate:  65 PR Interval:  144 QRS  Duration:  82 QT Interval:  398 QTC Calculation: 413 R Axis:   64  Text Interpretation: Sinus rhythm with occasional Premature ventricular complexes and Premature atrial complexes When compared with ECG of 20-Mar-2005 07:59, Premature ventricular complexes are now Present Premature atrial complexes are now Present Confirmed by Alverda Skeans ([id number]) on 09/01/2023 9:44:09 AM        RELEVANT IMAGING: Chest CT 2022 with aortic atherosclerosis  Risk Assessment/Calculations:          Physical Exam:   VS:  BP 130/80   Pulse 65   Ht 5' 7.5" (1.715 m)   Wt 196 lb (88.9 kg)   BMI 30.24 kg/m        Wt Readings from Last 3 Encounters:  09/01/23 196 lb (88.9 kg)  08/18/23 197 lb 3.2 oz (89.4 kg)  04/07/23 195 lb 12.8 oz (88.8 kg)  GENERAL:  No apparent distress, AOx3 HEENT:  No carotid bruits, +2 carotid impulses, no scleral icterus CAR: RRR no murmurs, gallops, rubs, or thrills RES:  Clear to auscultation bilaterally ABD:  Soft, nontender, nondistended, positive bowel sounds x 4 VASC:  +2 radial pulses, +2 carotid pulses NEURO:  CN 2-12 grossly intact; motor and sensory grossly intact PSYCH:  No active depression or anxiety EXT:  No edema, ecchymosis, or cyanosis  Signed, Orbie Pyo, MD  09/01/2023 9:59 AM    [facility] [address] [place], [place], [place]  [address] Phone: [phone]; Fax: [phone]   Note:  This document was prepared using Dragon voice recognition software and may include unintentional dictation errors.

## 2023-09-01 ENCOUNTER — Ambulatory Visit: Payer: PPO | Admitting: Physician Assistant

## 2023-09-01 ENCOUNTER — Ambulatory Visit: Payer: PPO | Attending: Internal Medicine | Admitting: Internal Medicine

## 2023-09-01 ENCOUNTER — Encounter: Payer: Self-pay | Admitting: Internal Medicine

## 2023-09-01 VITALS — BP 130/80 | HR 65 | Ht 67.5 in | Wt 196.0 lb

## 2023-09-01 DIAGNOSIS — E7841 Elevated Lipoprotein(a): Secondary | ICD-10-CM | POA: Diagnosis not present

## 2023-09-01 DIAGNOSIS — R002 Palpitations: Secondary | ICD-10-CM

## 2023-09-01 DIAGNOSIS — I10 Essential (primary) hypertension: Secondary | ICD-10-CM

## 2023-09-01 DIAGNOSIS — I700 Atherosclerosis of aorta: Secondary | ICD-10-CM | POA: Diagnosis not present

## 2023-09-01 DIAGNOSIS — I2510 Atherosclerotic heart disease of native coronary artery without angina pectoris: Secondary | ICD-10-CM

## 2023-09-01 DIAGNOSIS — Z0000 Encounter for general adult medical examination without abnormal findings: Secondary | ICD-10-CM

## 2023-09-01 DIAGNOSIS — J449 Chronic obstructive pulmonary disease, unspecified: Secondary | ICD-10-CM

## 2023-09-01 DIAGNOSIS — I7121 Aneurysm of the ascending aorta, without rupture: Secondary | ICD-10-CM | POA: Diagnosis not present

## 2023-09-01 NOTE — Patient Instructions (Signed)
Medication Instructions:  Your physician recommends that you continue on your current medications as directed. Please refer to the Current Medication list given to you today.  *If you need a refill on your cardiac medications before your next appointment, please call your pharmacy*  Lab Work: TODAY: Lipid panel, Hemoglobin A1c, reflex TSH, CMP If you have labs (blood work) drawn today and your tests are completely normal, you will receive your results only by: MyChart Message (if you have MyChart) OR A paper copy in the mail If you have any lab test that is abnormal or we need to change your treatment, we will call you to review the results.  Testing/Procedures: None ordered today.  Follow-Up: At [facility], you and your health needs are our priority.  As part of our continuing mission to provide you with exceptional heart care, we have created designated Provider Care Teams.  These Care Teams include your primary Cardiologist (physician) and Advanced Practice Providers (APPs -  Physician Assistants and Nurse Practitioners) who all work together to provide you with the care you need, when you need it.  Your next appointment:   6 month(s)  The format for your next appointment:   In Person  Provider:   Jari Favre, PA-C, Ronie Spies, PA-C, Daliyah Searing, NP, Tereso Newcomer, PA-C, or Perlie Gold, PA-C

## 2023-09-02 LAB — COMPREHENSIVE METABOLIC PANEL
ALT: 37 [IU]/L — ABNORMAL HIGH (ref 0–32)
AST: 28 [IU]/L (ref 0–40)
Albumin: 4.5 g/dL (ref 3.9–4.9)
Alkaline Phosphatase: 123 [IU]/L — ABNORMAL HIGH (ref 44–121)
BUN/Creatinine Ratio: 14 (ref 12–28)
BUN: 11 mg/dL (ref 8–27)
Bilirubin Total: 0.5 mg/dL (ref 0.0–1.2)
CO2: 25 mmol/L (ref 20–29)
Calcium: 9.6 mg/dL (ref 8.7–10.3)
Chloride: 104 mmol/L (ref 96–106)
Creatinine, Ser: 0.81 mg/dL (ref 0.57–1.00)
Globulin, Total: 2.6 g/dL (ref 1.5–4.5)
Glucose: 90 mg/dL (ref 70–99)
Potassium: 4.5 mmol/L (ref 3.5–5.2)
Sodium: 142 mmol/L (ref 134–144)
Total Protein: 7.1 g/dL (ref 6.0–8.5)
eGFR: 79 mL/min/{1.73_m2} (ref >59)

## 2023-09-02 LAB — LIPID PANEL
Chol/HDL Ratio: 2.8 {ratio} (ref 0.0–4.4)
Cholesterol, Total: 140 mg/dL (ref 100–199)
HDL: 50 mg/dL (ref >39)
LDL Chol Calc (NIH): 67 mg/dL (ref 0–99)
Triglycerides: 132 mg/dL (ref 0–149)
VLDL Cholesterol Cal: 23 mg/dL (ref 5–40)

## 2023-09-02 LAB — HEMOGLOBIN A1C
Est. average glucose Bld gHb Est-mCnc: 128 mg/dL
Hgb A1c MFr Bld: 6.1 % — ABNORMAL HIGH (ref 4.8–5.6)

## 2023-09-02 LAB — TSH RFX ON ABNORMAL TO FREE T4: TSH: 3.46 u[IU]/mL (ref 0.450–4.500)

## 2023-09-03 ENCOUNTER — Other Ambulatory Visit: Payer: Self-pay | Admitting: Internal Medicine

## 2023-09-03 ENCOUNTER — Encounter: Payer: Self-pay | Admitting: Internal Medicine

## 2023-09-03 ENCOUNTER — Other Ambulatory Visit: Payer: Self-pay | Admitting: Pulmonary Disease

## 2023-09-03 DIAGNOSIS — E7841 Elevated Lipoprotein(a): Secondary | ICD-10-CM

## 2023-09-03 DIAGNOSIS — R748 Abnormal levels of other serum enzymes: Secondary | ICD-10-CM

## 2023-09-06 MED ORDER — BREZTRI AEROSPHERE 160-9-4.8 MCG/ACT IN AERO
2.0000 | INHALATION_SPRAY | Freq: Two times a day (BID) | RESPIRATORY_TRACT | 5 refills | Status: DC
  Filled 2023-09-06: qty 10.7, 30d supply, fill #0

## 2023-09-06 MED ORDER — ATORVASTATIN CALCIUM 80 MG PO TABS: 80.0000 mg | ORAL_TABLET | Freq: Every day | ORAL | 3 refills | Status: DC

## 2023-09-07 ENCOUNTER — Other Ambulatory Visit: Payer: Self-pay

## 2023-09-07 ENCOUNTER — Other Ambulatory Visit (HOSPITAL_COMMUNITY): Payer: Self-pay

## 2023-09-08 ENCOUNTER — Encounter (HOSPITAL_COMMUNITY): Payer: Self-pay

## 2023-09-08 ENCOUNTER — Other Ambulatory Visit (HOSPITAL_BASED_OUTPATIENT_CLINIC_OR_DEPARTMENT_OTHER): Payer: Self-pay

## 2023-09-08 ENCOUNTER — Other Ambulatory Visit (HOSPITAL_COMMUNITY): Payer: Self-pay

## 2023-09-08 ENCOUNTER — Encounter: Payer: Self-pay | Admitting: Pulmonary Disease

## 2023-09-08 ENCOUNTER — Other Ambulatory Visit: Payer: Self-pay | Admitting: Pulmonary Disease

## 2023-09-08 NOTE — Telephone Encounter (Signed)
Calling bc Pt needs a 3 month supply Pharmacy states it needs to be a new prescription sent in for 30.1g

## 2023-09-09 ENCOUNTER — Other Ambulatory Visit (HOSPITAL_COMMUNITY): Payer: Self-pay

## 2023-09-09 ENCOUNTER — Encounter (HOSPITAL_COMMUNITY): Payer: Self-pay

## 2023-09-09 ENCOUNTER — Ambulatory Visit
Admission: RE | Admit: 2023-09-09 | Discharge: 2023-09-09 | Disposition: A | Discharge status: Home / Self Care | Payer: PPO | Source: Ambulatory Visit | Attending: Internal Medicine | Admitting: Internal Medicine

## 2023-09-09 ENCOUNTER — Other Ambulatory Visit: Payer: Self-pay

## 2023-09-09 ENCOUNTER — Other Ambulatory Visit (HOSPITAL_BASED_OUTPATIENT_CLINIC_OR_DEPARTMENT_OTHER): Payer: Self-pay

## 2023-09-09 DIAGNOSIS — R945 Abnormal results of liver function studies: Secondary | ICD-10-CM | POA: Diagnosis not present

## 2023-09-09 DIAGNOSIS — R748 Abnormal levels of other serum enzymes: Secondary | ICD-10-CM

## 2023-09-10 ENCOUNTER — Other Ambulatory Visit (HOSPITAL_BASED_OUTPATIENT_CLINIC_OR_DEPARTMENT_OTHER): Payer: Self-pay

## 2023-09-10 ENCOUNTER — Other Ambulatory Visit (HOSPITAL_COMMUNITY): Payer: Self-pay

## 2023-09-10 ENCOUNTER — Other Ambulatory Visit: Payer: Self-pay

## 2023-09-10 MED ORDER — BREZTRI AEROSPHERE 160-9-4.8 MCG/ACT IN AERO
2.0000 | INHALATION_SPRAY | Freq: Two times a day (BID) | RESPIRATORY_TRACT | 1 refills | Status: DC
  Filled 2023-09-10: qty 32.1, 90d supply, fill #0
  Filled 2023-12-04 – 2023-12-06 (×2): qty 32.1, 90d supply, fill #1

## 2023-09-29 ENCOUNTER — Other Ambulatory Visit (HOSPITAL_COMMUNITY): Payer: Self-pay

## 2023-10-14 ENCOUNTER — Other Ambulatory Visit: Payer: Self-pay

## 2023-10-14 ENCOUNTER — Other Ambulatory Visit (HOSPITAL_COMMUNITY): Payer: Self-pay

## 2023-10-27 DIAGNOSIS — Z79899 Other long term (current) drug therapy: Secondary | ICD-10-CM | POA: Diagnosis not present

## 2023-10-27 DIAGNOSIS — E7800 Pure hypercholesterolemia, unspecified: Secondary | ICD-10-CM | POA: Diagnosis not present

## 2023-10-27 DIAGNOSIS — K219 Gastro-esophageal reflux disease without esophagitis: Secondary | ICD-10-CM | POA: Diagnosis not present

## 2023-10-27 DIAGNOSIS — E039 Hypothyroidism, unspecified: Secondary | ICD-10-CM | POA: Diagnosis not present

## 2023-10-27 DIAGNOSIS — I712 Thoracic aortic aneurysm, without rupture, unspecified: Secondary | ICD-10-CM | POA: Diagnosis not present

## 2023-10-27 DIAGNOSIS — K760 Fatty (change of) liver, not elsewhere classified: Secondary | ICD-10-CM | POA: Diagnosis not present

## 2023-10-27 DIAGNOSIS — N898 Other specified noninflammatory disorders of vagina: Secondary | ICD-10-CM | POA: Diagnosis not present

## 2023-10-27 DIAGNOSIS — I700 Atherosclerosis of aorta: Secondary | ICD-10-CM | POA: Diagnosis not present

## 2023-10-27 DIAGNOSIS — R7303 Prediabetes: Secondary | ICD-10-CM | POA: Diagnosis not present

## 2023-10-27 DIAGNOSIS — Z23 Encounter for immunization: Secondary | ICD-10-CM | POA: Diagnosis not present

## 2023-10-27 DIAGNOSIS — J449 Chronic obstructive pulmonary disease, unspecified: Secondary | ICD-10-CM | POA: Diagnosis not present

## 2023-10-27 DIAGNOSIS — I10 Essential (primary) hypertension: Secondary | ICD-10-CM | POA: Diagnosis not present

## 2023-10-29 ENCOUNTER — Other Ambulatory Visit: Payer: Self-pay

## 2023-10-29 ENCOUNTER — Other Ambulatory Visit (HOSPITAL_COMMUNITY): Payer: Self-pay

## 2023-11-03 ENCOUNTER — Ambulatory Visit: Payer: PPO

## 2023-11-03 DIAGNOSIS — E7841 Elevated Lipoprotein(a): Secondary | ICD-10-CM

## 2023-11-04 ENCOUNTER — Telehealth: Payer: Self-pay

## 2023-11-04 DIAGNOSIS — R748 Abnormal levels of other serum enzymes: Secondary | ICD-10-CM

## 2023-11-04 LAB — HEPATIC FUNCTION PANEL
ALT: 49 [IU]/L — ABNORMAL HIGH (ref 0–32)
AST: 38 [IU]/L (ref 0–40)
Albumin: 4.3 g/dL (ref 3.9–4.9)
Alkaline Phosphatase: 110 [IU]/L (ref 44–121)
Bilirubin Total: 0.5 mg/dL (ref 0.0–1.2)
Bilirubin, Direct: 0.16 mg/dL (ref 0.00–0.40)
Total Protein: 6.8 g/dL (ref 6.0–8.5)

## 2023-11-04 LAB — LIPID PANEL
Chol/HDL Ratio: 2.4 {ratio} (ref 0.0–4.4)
Cholesterol, Total: 127 mg/dL (ref 100–199)
HDL: 52 mg/dL (ref >39)
LDL Chol Calc (NIH): 61 mg/dL (ref 0–99)
Triglycerides: 70 mg/dL (ref 0–149)
VLDL Cholesterol Cal: 14 mg/dL (ref 5–40)

## 2023-11-04 NOTE — Telephone Encounter (Signed)
Reached out to patient to clarify that she can go to any [facility] location next month and labs are in for her. Verbalized understanding.

## 2023-11-04 NOTE — Telephone Encounter (Signed)
-----   Message from Orbie Pyo sent at 11/04/2023  6:57 AM EST ----- LFTs in 1 month

## 2023-11-21 ENCOUNTER — Encounter: Payer: Self-pay | Admitting: Internal Medicine

## 2023-12-03 ENCOUNTER — Other Ambulatory Visit: Payer: Self-pay | Admitting: Internal Medicine

## 2023-12-03 DIAGNOSIS — Z1231 Encounter for screening mammogram for malignant neoplasm of breast: Secondary | ICD-10-CM

## 2023-12-04 ENCOUNTER — Other Ambulatory Visit (HOSPITAL_COMMUNITY): Payer: Self-pay

## 2023-12-06 ENCOUNTER — Other Ambulatory Visit: Payer: Self-pay

## 2023-12-06 ENCOUNTER — Other Ambulatory Visit (HOSPITAL_COMMUNITY): Payer: Self-pay

## 2023-12-07 ENCOUNTER — Other Ambulatory Visit (HOSPITAL_COMMUNITY): Payer: Self-pay

## 2023-12-07 ENCOUNTER — Encounter: Payer: Self-pay | Admitting: Internal Medicine

## 2023-12-07 ENCOUNTER — Other Ambulatory Visit: Payer: Self-pay

## 2023-12-07 ENCOUNTER — Other Ambulatory Visit (HOSPITAL_BASED_OUTPATIENT_CLINIC_OR_DEPARTMENT_OTHER): Payer: Self-pay

## 2023-12-09 DIAGNOSIS — R748 Abnormal levels of other serum enzymes: Secondary | ICD-10-CM | POA: Diagnosis not present

## 2023-12-10 ENCOUNTER — Encounter: Payer: Self-pay | Admitting: Internal Medicine

## 2023-12-10 LAB — HEPATIC FUNCTION PANEL
ALT: 62 [IU]/L — ABNORMAL HIGH (ref 0–32)
AST: 41 [IU]/L — ABNORMAL HIGH (ref 0–40)
Albumin: 4.2 g/dL (ref 3.9–4.9)
Alkaline Phosphatase: 103 [IU]/L (ref 44–121)
Bilirubin Total: 0.6 mg/dL (ref 0.0–1.2)
Bilirubin, Direct: 0.23 mg/dL (ref 0.00–0.40)
Total Protein: 6.5 g/dL (ref 6.0–8.5)

## 2023-12-28 ENCOUNTER — Other Ambulatory Visit: Payer: Self-pay | Admitting: Internal Medicine

## 2023-12-29 ENCOUNTER — Other Ambulatory Visit (HOSPITAL_COMMUNITY): Payer: Self-pay

## 2023-12-29 ENCOUNTER — Other Ambulatory Visit: Payer: Self-pay

## 2023-12-29 MED ORDER — EZETIMIBE 10 MG PO TABS
10.0000 mg | ORAL_TABLET | Freq: Every day | ORAL | 2 refills | Status: DC
  Filled 2023-12-29: qty 90, 90d supply, fill #0
  Filled 2024-03-21: qty 90, 90d supply, fill #1

## 2024-01-17 ENCOUNTER — Ambulatory Visit
Admission: RE | Admit: 2024-01-17 | Discharge: 2024-01-17 | Disposition: A | Discharge status: Home / Self Care | Payer: PPO | Source: Ambulatory Visit | Attending: Internal Medicine | Admitting: Internal Medicine

## 2024-01-17 DIAGNOSIS — Z1231 Encounter for screening mammogram for malignant neoplasm of breast: Secondary | ICD-10-CM

## 2024-01-19 ENCOUNTER — Other Ambulatory Visit (HOSPITAL_COMMUNITY): Payer: Self-pay

## 2024-01-26 ENCOUNTER — Other Ambulatory Visit: Payer: Self-pay

## 2024-01-26 ENCOUNTER — Other Ambulatory Visit (HOSPITAL_COMMUNITY): Payer: Self-pay

## 2024-01-31 DIAGNOSIS — M8580 Other specified disorders of bone density and structure, unspecified site: Secondary | ICD-10-CM | POA: Diagnosis not present

## 2024-01-31 DIAGNOSIS — K219 Gastro-esophageal reflux disease without esophagitis: Secondary | ICD-10-CM | POA: Diagnosis not present

## 2024-01-31 DIAGNOSIS — I712 Thoracic aortic aneurysm, without rupture, unspecified: Secondary | ICD-10-CM | POA: Diagnosis not present

## 2024-01-31 DIAGNOSIS — I10 Essential (primary) hypertension: Secondary | ICD-10-CM | POA: Diagnosis not present

## 2024-01-31 DIAGNOSIS — Z23 Encounter for immunization: Secondary | ICD-10-CM | POA: Diagnosis not present

## 2024-01-31 DIAGNOSIS — R7303 Prediabetes: Secondary | ICD-10-CM | POA: Diagnosis not present

## 2024-01-31 DIAGNOSIS — R1011 Right upper quadrant pain: Secondary | ICD-10-CM | POA: Diagnosis not present

## 2024-01-31 DIAGNOSIS — K760 Fatty (change of) liver, not elsewhere classified: Secondary | ICD-10-CM | POA: Diagnosis not present

## 2024-01-31 DIAGNOSIS — J449 Chronic obstructive pulmonary disease, unspecified: Secondary | ICD-10-CM | POA: Diagnosis not present

## 2024-01-31 DIAGNOSIS — E039 Hypothyroidism, unspecified: Secondary | ICD-10-CM | POA: Diagnosis not present

## 2024-01-31 DIAGNOSIS — E7800 Pure hypercholesterolemia, unspecified: Secondary | ICD-10-CM | POA: Diagnosis not present

## 2024-02-02 ENCOUNTER — Ambulatory Visit: Payer: PPO | Admitting: Pulmonary Disease

## 2024-02-09 DIAGNOSIS — D3131 Benign neoplasm of right choroid: Secondary | ICD-10-CM | POA: Diagnosis not present

## 2024-02-29 ENCOUNTER — Ambulatory Visit: Payer: PPO | Admitting: Physician Assistant

## 2024-03-21 ENCOUNTER — Other Ambulatory Visit (HOSPITAL_BASED_OUTPATIENT_CLINIC_OR_DEPARTMENT_OTHER): Payer: Self-pay | Admitting: Pulmonary Disease

## 2024-03-21 ENCOUNTER — Other Ambulatory Visit: Payer: Self-pay

## 2024-03-21 ENCOUNTER — Other Ambulatory Visit (HOSPITAL_COMMUNITY): Payer: Self-pay

## 2024-03-21 MED ORDER — LOSARTAN POTASSIUM 25 MG PO TABS
25.0000 mg | ORAL_TABLET | Freq: Every day | ORAL | 3 refills | Status: AC
  Filled 2024-03-21: qty 90, 90d supply, fill #0
  Filled 2024-06-15: qty 90, 90d supply, fill #1
  Filled 2024-09-13: qty 90, 90d supply, fill #2
  Filled 2024-12-12: qty 90, 90d supply, fill #3

## 2024-03-21 MED ORDER — AMLODIPINE BESYLATE 2.5 MG PO TABS
2.5000 mg | ORAL_TABLET | Freq: Every day | ORAL | 3 refills | Status: AC
  Filled 2024-03-21: qty 90, 90d supply, fill #0
  Filled 2024-06-15: qty 90, 90d supply, fill #1
  Filled 2024-09-13: qty 90, 90d supply, fill #2
  Filled 2024-12-12: qty 90, 90d supply, fill #3

## 2024-03-22 ENCOUNTER — Other Ambulatory Visit: Payer: Self-pay

## 2024-03-22 ENCOUNTER — Encounter: Payer: Self-pay | Admitting: Pulmonary Disease

## 2024-03-22 ENCOUNTER — Ambulatory Visit: Payer: PPO | Admitting: Pulmonary Disease

## 2024-03-22 ENCOUNTER — Other Ambulatory Visit (HOSPITAL_COMMUNITY): Payer: Self-pay

## 2024-03-22 VITALS — BP 120/68 | HR 58 | Temp 97.8°F | Ht 67.5 in | Wt 187.6 lb

## 2024-03-22 DIAGNOSIS — Z87891 Personal history of nicotine dependence: Secondary | ICD-10-CM | POA: Diagnosis not present

## 2024-03-22 DIAGNOSIS — R918 Other nonspecific abnormal finding of lung field: Secondary | ICD-10-CM

## 2024-03-22 DIAGNOSIS — J4489 Other specified chronic obstructive pulmonary disease: Secondary | ICD-10-CM

## 2024-03-22 DIAGNOSIS — I7121 Aneurysm of the ascending aorta, without rupture: Secondary | ICD-10-CM | POA: Diagnosis not present

## 2024-03-22 DIAGNOSIS — J45909 Unspecified asthma, uncomplicated: Secondary | ICD-10-CM | POA: Diagnosis not present

## 2024-03-22 DIAGNOSIS — J449 Chronic obstructive pulmonary disease, unspecified: Secondary | ICD-10-CM

## 2024-03-22 MED ORDER — BREZTRI AEROSPHERE 160-9-4.8 MCG/ACT IN AERO
2.0000 | INHALATION_SPRAY | Freq: Two times a day (BID) | RESPIRATORY_TRACT | 4 refills | Status: AC
  Filled 2024-03-22: qty 32.1, 90d supply, fill #0
  Filled 2024-06-15: qty 32.1, 90d supply, fill #1
  Filled 2024-09-13: qty 32.1, 90d supply, fill #2
  Filled 2024-12-12: qty 32.1, 90d supply, fill #3

## 2024-03-22 NOTE — Patient Instructions (Signed)
 VISIT SUMMARY:  Today, you came in for a follow-up on your respiratory conditions, including COPD and asthma, and to monitor your aortic aneurysm. We discussed your current symptoms, medications, and future monitoring plans.  YOUR PLAN:  -SEVERE COPD WITH ASTHMA: Chronic Obstructive Pulmonary Disease (COPD) with asthma is a condition where the airways in your lungs are inflamed and narrowed, making it hard to breathe. You are currently using the Breztri  inhaler, which you find effective. To help with the hoarseness and frogginess in your throat, we will provide you with a spacer device to use with your inhaler. This will help reduce medication deposition in your throat and improve delivery to your lungs. Please continue to rinse your mouth and gargle after each use. Your prescription for Breztri  has been sent with refills for one year.  -LUNG NODULES: Lung nodules are small growths in the lungs that need to be monitored regularly, especially given your smoking history. We will continue to monitor these with annual CT scans to ensure they do not develop into something more serious. Your next CT scan is scheduled for July 2025.  -AORTIC ANEURYSM: An aortic aneurysm is an enlargement of the aorta, the main blood vessel that supplies blood to your body. Your aneurysm is currently 4.6 cm and is being monitored by your cardiologist. We discussed the importance of regular CT scans to monitor its size and prevent any life-threatening complications. Your next CT scan is scheduled for July 2025.  INSTRUCTIONS:  Please use the spacer device with your Breztri  inhaler to help reduce throat irritation. Continue to rinse your mouth and gargle after each use. Your prescription for Breztri  has been sent with refills for one year. Your next CT scan to monitor your lung nodules and aortic aneurysm is scheduled for July 2025.

## 2024-03-22 NOTE — Progress Notes (Signed)
 [name] [name]    [id number]    17-Jul-1956  Primary Care Physician:Henderson, Winslow Hawk, MD  Referring Physician: Benedetta Bradley, MD [address] [place],  [place] [address]  Chief complaint: Follow-up for COPD  HPI: 68 y.o.  with history of COPD.  Previously followed by Dr. Waymond Hailey.  Has not been seen PCCM since 2013 Maintained on Spiriva.  Advair was started in June 2021 Complains of chronic dyspnea on exertion.  Occasional cough, no fevers or chills Other medical issues include allergies with postnasal drip, GERD for which she is on Prilosec twice daily.  She has occasional hoarseness of voice Evaluated by Dr. Coralee Derby ENT in April 2021 with laryngoscope showing vocal cord edema consistent with LPR  History notable for thoracic aortic aneurysm which was noted during follow-up for lung nodules.  The aneurysm is slowly increasing in size.  She has been evaluated by cardiothoracic surgery with continued surveillance recommended.  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  No feather pillows or comforter Smoking history: 30-pack-year smoker.  Quit in 2007 Travel history: No significant travel history Relevant family history: No family history of lung disease  Interim history:  Discussed the use of AI scribe software for clinical note transcription with the patient, who gave verbal consent to proceed.  History of Present Illness [name] [name] is a 68 year old female with COPD and asthma who presents for follow-up of her respiratory conditions and monitoring of her aortic aneurysm.  She experiences regular sniffles, red eyes, and occasional breathlessness due to pollen exposure. An allergy pill provides relief, but she tries to stay indoors to  minimize symptoms.  She is currently using Breztri  for her COPD and asthma, which she finds effective despite needing to use it twice daily. She prefers the spray form over dry powder inhalers. However, she experiences 'frogginess' in her throat, leading to voice cracking. She does not use a spacer device but gargles after use.  She has a history of aortic aneurysm, currently measuring 4.6 cm, and is due for a CT scan in July 2025 to monitor its status. She has a smoking history and small lung nodules that are being monitored.  She experiences soreness in her chest upon waking, which resolves during the day. She describes it as soreness rather than sharp pain.      Outpatient Encounter Medications as of 03/22/2024  Medication Sig   albuterol  (VENTOLIN  HFA) 108 (90 Base) MCG/ACT inhaler Inhale 2 puffs into the lungs every 4 (four) hours as needed for wheezing or shortness of breath.   amLODipine  (NORVASC ) 2.5 MG tablet Take 1 tablet (2.5 mg total) by mouth daily.   aspirin EC 81 MG tablet Take 81 mg by mouth daily. Swallow whole.   atorvastatin  (LIPITOR) 80 MG tablet Take 1 tablet (80 mg total) by mouth daily.   Budeson-Glycopyrrol-Formoterol  (BREZTRI  AEROSPHERE) 160-9-4.8 MCG/ACT AERO Inhale 2 puffs into the lungs in the morning and at bedtime.   cholecalciferol (VITAMIN D) 1000 units tablet Take 1,000 Units by mouth daily.   ezetimibe  (ZETIA ) 10 MG tablet Take 1 tablet (10 mg) by mouth daily.   levothyroxine  (SYNTHROID ) 100 MCG tablet Take 1 tablet (100 mcg total) by mouth in the morning on an empty stomach.  losartan  (COZAAR ) 25 MG tablet Take 1 tablet (25 mg total) by mouth daily.   Multiple Vitamin (MULTIVITAMIN) tablet Take 1 tablet by mouth daily.   omeprazole  (PRILOSEC) 20 MG capsule Take 1 capsule (20 mg total) by mouth daily.   No facility-administered encounter medications on file as of 03/22/2024.    Physical Exam: Blood pressure 120/68, pulse (!) 58, temperature 97.8 F (36.6  C), temperature source Oral, height 5' 7.5" (1.715 m), weight 187 lb 9.6 oz (85.1 kg), SpO2 96%. Gen:      No acute distress HEENT:  EOMI, sclera anicteric Neck:     No masses; no thyromegaly Lungs:    Clear to auscultation bilaterally; normal respiratory effort CV:         Regular rate and rhythm; no murmurs Abd:      + bowel sounds; soft, non-tender; no palpable masses, no distension Ext:    No edema; adequate peripheral perfusion Skin:      Warm and dry; no rash Neuro: alert and oriented x 3 Psych: normal mood and affect   Data Reviewed: Imaging: CT chest 08/21/2021-stable thoracic aortic aneurysm measuring 4.7 cm, mild coronary artery calcification, severe emphysema.    CT chest 06/08/2022-waxing and waning areas of nodularity bilaterally, 4.5 cm ascending aortic aneurysm.  CT chest 09/14/2022- Continued improvement in size of lung nodules.  CT chest 06/18/2023-stable ascending aortic aneurysm, stable lung nodule I have reviewed the images personally.  PFTs: 08/16/2012 FVC 3.71 [96%], FEV1 1.76 [50%], F/F 48, TLC 7.09 [125%], DLCO 17.75 [59%]  12/12/2021 FVC 2.94 [81%], FEV1 1.39 [50%], F/F 47, TLC 7.23 [127%], DLCO 13.74 [61%] Severe obstruction with hyperinflation, air trapping.  Moderate diffusion impairment  Labs: CBC 05/20/2021-WBC 8.5, eos 7%, absolute eosinophil count 595 CBC 12/08/2022-WBC 6.9, eos 2.8%, absolute eosinophil count 193 IgE 12/10/2022-41 Alpha-1 antitrypsin 12/10/2022-120, PI MM Assessment & Plan Severe COPD with asthma Severe COPD with asthma overlap. Currently using Breztri  inhaler, preferred over Trelegy for better symptom control. Reports hoarseness and frogginess in throat, likely due to medication deposition. - Provide spacer device for inhaler to reduce throat deposition and enhance lung delivery. - Instruct to rinse mouth and gargle after inhaler use. - Send prescription for Breztri  with refills for one year.  Lung nodules Lung nodules monitored  with annual CT scans due to smoking history. No symptoms suggestive of malignancy. Discussed radiation exposure risks from CT scans, emphasizing monitoring importance to prevent life-threatening conditions. - Order CT chest in July 2025 to monitor lung nodules.  Aortic aneurysm Aortic aneurysm measuring 4.6 cm. Last CT scan in July 2024, next scheduled for July 2025. Discussed CT scan risk-benefit, emphasizing monitoring importance to prevent life-threatening complications. - Pending CT chest in July 2025 to monitor aortic aneurysm.    Plan/Recommendations: Continue Breztri  with spacer CT in July 2025  Phyllis Breeze MD [facility] 03/22/2024, 9:14 AM  CC: Benedetta Bradley, *

## 2024-03-26 NOTE — Progress Notes (Unsigned)
 Cardiology Office Note    Date:  03/27/2024  ID:  Celine, Lyster [birth year], MRN [id number] PCP:  Benedetta Bradley, MD  Cardiologist:  Kyra Phy, MD  Electrophysiologist:  None   Chief Complaint: discuss statin management  History of Present Illness: .    [name] [name] is a 68 y.o. female with visit-pertinent history of coronary calcification by CT, ascending TAA, HTN, elevated LP(a), COPD followed by pulmonology, hepatic steatosis, aortic atherosclerosis, pre-diabetes, palpitations (2018 monitor with 1% PVCs, <1% PACs, brief PAT, brief 2 beat junctional escape during sleep), seen for follow-up. Echo 08/2021 showed EF 60-65%, G1DD, trivial AI, dilation of aorta. Ascending TAA has been subsequently followed by CT surgery. Last CT 05/2023 showed 4.3x4.6cm dimension, stable pulmonary nodules recommended for 6 month follow-up, aortic atherosclerosis and emphysema. Report also outlines "Small cavitary lesion along the inferior posterior LEFT UPPER lobe is adjacent to bronchiectasis. Today this area measures 0.9 x 0.5 centimeters previously 1.0 x 0.5 centimeters." She is being followed by pulmonology who recommended f/u CT 05/2024. Dr. Lorie Rook has observed mildly elevated LFTs on her previous labwork and recommended f/u PCP for hepatic steatosis (seen on US  08/2023). She saw Dr. Maximo Spar in 01/2023 for elevated LPA/mixed dyslipidemia who recommended to continue atorvastatin  and ezetimibe .  She is seen for follow-up today with her husband reporting she is doing well. She reports her chronic dyspnea is at baseline, which she feels is due to her COPD. Her husband reports she is diligent about participating in exercise. She denies any chest pain. Palpitations remain infrequent, last episode about 3 weeks ago, max 15 minutes, not interfering with daily life. About 2-3 months ago she reduced her atorvastatin  to 1/2 tablet (40mg ) and had recheck labs in March with PCP showing improvement in ALT to 49  with LDL 59. She is concerned about statin use given the h/o LFT fluctuations, as well as a family history of significant liver function abnormality in her brother that was attributed to statins. She inquires whether injectables would be an option.  Labwork independently reviewed: 01/2024 LDL 59, ALT 49 12/2023 AST 41, ALT 62 slightly elevated in past as well 10/2023 LDL 61, trig 70 08/2023 TSH OK, A1c 6.1, Cr 0.81 03/2023 Hgb 13.1  ROS: .    Please see the history of present illness.  All other systems are reviewed and otherwise negative.  Studies Reviewed: Aaron Aas    EKG:  EKG is ordered today, personally reviewed, demonstrating EKG Interpretation Date/Time:  Monday March 27 2024 13:49:12 EDT Ventricular Rate:  66 PR Interval:  166 QRS Duration:  80 QT Interval:  400 QTC Calculation: 419 R Axis:   -12  Text Interpretation: Normal sinus rhythm Nonspecific ST and T wave abnormality similar to prior Confirmed by Emanie Behan [phone]) on 03/27/2024 2:11:12 PM    CV Studies: Cardiac studies reviewed are outlined and summarized above. Otherwise please see EMR for full report.   Current Reported Medications:.    Current Meds  Medication Sig   albuterol  (VENTOLIN  HFA) 108 (90 Base) MCG/ACT inhaler Inhale 2 puffs into the lungs every 4 (four) hours as needed for wheezing or shortness of breath.   amLODipine  (NORVASC ) 2.5 MG tablet Take 1 tablet (2.5 mg total) by mouth daily.   aspirin EC 81 MG tablet Take 81 mg by mouth daily. Swallow whole.   atorvastatin  (LIPITOR) 80 MG tablet Take 1 tablet (80 mg total) by mouth daily. (Patient taking differently: Take 80 mg by mouth daily.  Pt says takes 40 mg, cuts 80 mg in half)   budeson-glycopyrrolate -formoterol  (BREZTRI  AEROSPHERE) 160-9-4.8 MCG/ACT AERO inhaler Inhale 2 puffs into the lungs in the morning and at bedtime.   cholecalciferol (VITAMIN D) 1000 units tablet Take 1,000 Units by mouth daily.   ezetimibe  (ZETIA ) 10 MG tablet Take 1 tablet (10  mg) by mouth daily.   levothyroxine  (SYNTHROID ) 100 MCG tablet Take 1 tablet (100 mcg total) by mouth in the morning on an empty stomach.   losartan  (COZAAR ) 25 MG tablet Take 1 tablet (25 mg total) by mouth daily.   Multiple Vitamin (MULTIVITAMIN) tablet Take 1 tablet by mouth daily.   omeprazole  (PRILOSEC) 20 MG capsule Take 1 capsule (20 mg total) by mouth daily.    Physical Exam:    VS:  BP 124/80 (BP Location: Left Arm, Patient Position: Sitting, Cuff Size: Normal)   Pulse 66   Ht 5\' 7"  (1.702 m)   Wt 186 lb (84.4 kg)   SpO2 95%   BMI 29.13 kg/m    Wt Readings from Last 3 Encounters:  03/27/24 186 lb (84.4 kg)  03/22/24 187 lb 9.6 oz (85.1 kg)  09/01/23 196 lb (88.9 kg)    GEN: Well nourished, well developed in no acute distress NECK: No JVD; No carotid bruits CARDIAC: RRR, no murmurs, rubs, gallops RESPIRATORY:  Clear to auscultation without rales, wheezing or rhonchi  ABDOMEN: Soft, non-tender, non-distended EXTREMITIES:  No edema; No acute deformity   Asessement and Plan:.    1. Coronary calcification seen on CT - Dr. Lorie Rook has not felt she required any additional ischemic workup in the past. LVEF has been normal and she has not been having any recent accelerating cardiac symptoms. Continue ASA. She inquires about alternatives to her cholesterol regimen given concerns of LFT abnormalities as above, as well as family history in her brother with LFTs that went through the roof with statin therapy. She downward adjusted her atorvastatin  to 40mg  daily and stayed on Zetia  10mg  daily several months ago. F/u LDL by PCP was 59 in 01/2024, near goal of <55, and under goal of <70, with improvement in ALT to 49. We discussed that guidelines suggest that statin therapy does not need to be modified unless we are seeing levels >3x ULN, though certainly understand her concern given her family history of more significant impairment. We will recheck LFTs today per our discussion and refer to  pharmD lipid clinic to discuss if she would be a candidate to transition to injectable therapy instead. I am not sure she will qualify but helpful to investigate. Hepatic steatosis may also likely contributing to the fluctuations. She will hold off on any med changes at this time. She will come fasting to that appointment in case sooner recheck is warranted or the plan prompts additional monitoring; hold off for now given fairly recent value in 01/2024. Also getting updated baseline CBC given baseline ASA therapy.  2. Aortic atherosclerosis - no acute intervention needed other than lipid management, outlined above.  3. PACs, PVCS, PSVT - low burden of symptoms. Declines monitor at this time. Discussed use of SmartWatch to capture episodes and transmit for our review. TSH 08/2023 was normal. Recheck CMET, MG today.  4. Ascending TAA - this is being followed by outside offices along with her pulmonary nodules (Dr. Deloise Ferries and Dr. Waylan Haggard). Management per their teams. Blood pressure is normal.    Disposition: F/u with Dr. Lorie Rook in 1 year.  Signed, Audreyanna Butkiewicz N Orin Eberwein, PA-C

## 2024-03-27 ENCOUNTER — Encounter: Payer: Self-pay | Admitting: Physician Assistant

## 2024-03-27 ENCOUNTER — Ambulatory Visit: Attending: Physician Assistant | Admitting: Physician Assistant

## 2024-03-27 VITALS — BP 124/80 | HR 66 | Ht 67.0 in | Wt 186.0 lb

## 2024-03-27 DIAGNOSIS — I700 Atherosclerosis of aorta: Secondary | ICD-10-CM

## 2024-03-27 DIAGNOSIS — E785 Hyperlipidemia, unspecified: Secondary | ICD-10-CM

## 2024-03-27 DIAGNOSIS — I7121 Aneurysm of the ascending aorta, without rupture: Secondary | ICD-10-CM | POA: Diagnosis not present

## 2024-03-27 DIAGNOSIS — R002 Palpitations: Secondary | ICD-10-CM

## 2024-03-27 DIAGNOSIS — I2510 Atherosclerotic heart disease of native coronary artery without angina pectoris: Secondary | ICD-10-CM

## 2024-03-27 NOTE — Patient Instructions (Signed)
 Medication Instructions:  Your physician recommends that you continue on your current medications as directed. Please refer to the Current Medication list given to you today.  *If you need a refill on your cardiac medications before your next appointment, please call your pharmacy*  Lab Work: TODAY:  CMET, CBC, & MAG  If you have labs (blood work) drawn today and your tests are completely normal, you will receive your results only by: MyChart Message (if you have MyChart) OR A paper copy in the mail If you have any lab test that is abnormal or we need to change your treatment, we will call you to review the results.  Testing/Procedures: None ordered   You have been referred to Pharm D to discuss alternatives for Cholesterol  Follow-Up: At [facility], you and your health needs are our priority.  As part of our continuing mission to provide you with exceptional heart care, our providers are all part of one team.  This team includes your primary Cardiologist (physician) and Advanced Practice Providers or APPs (Physician Assistants and Nurse Practitioners) who all work together to provide you with the care you need, when you need it.  Your next appointment:   12 month(s)  Provider:   Arun K Thukkani, MD    We recommend signing up for the patient portal called "MyChart".  Sign up information is provided on this After Visit Summary.  MyChart is used to connect with patients for Virtual Visits (Telemedicine).  Patients are able to view lab/test results, encounter notes, upcoming appointments, etc.  Non-urgent messages can be sent to your provider as well.   To learn more about what you can do with MyChart, go to [email or web address].   Other Instructions

## 2024-03-28 LAB — CBC
Hematocrit: 43.1 % (ref 34.0–46.6)
Hemoglobin: 13.8 g/dL (ref 11.1–15.9)
MCH: 31.1 pg (ref 26.6–33.0)
MCHC: 32 g/dL (ref 31.5–35.7)
MCV: 97 fL (ref 79–97)
Platelets: 285 10*3/uL (ref 150–450)
RBC: 4.44 x10E6/uL (ref 3.77–5.28)
RDW: 12.3 % (ref 11.7–15.4)
WBC: 7.8 10*3/uL (ref 3.4–10.8)

## 2024-03-28 LAB — COMPREHENSIVE METABOLIC PANEL WITH GFR
ALT: 50 IU/L — ABNORMAL HIGH (ref 0–32)
AST: 38 IU/L (ref 0–40)
Albumin: 4.2 g/dL (ref 3.9–4.9)
Alkaline Phosphatase: 100 IU/L (ref 44–121)
BUN/Creatinine Ratio: 13 (ref 12–28)
BUN: 12 mg/dL (ref 8–27)
Bilirubin Total: 0.3 mg/dL (ref 0.0–1.2)
CO2: 25 mmol/L (ref 20–29)
Calcium: 9.4 mg/dL (ref 8.7–10.3)
Chloride: 106 mmol/L (ref 96–106)
Creatinine, Ser: 0.9 mg/dL (ref 0.57–1.00)
Globulin, Total: 2.1 g/dL (ref 1.5–4.5)
Glucose: 80 mg/dL (ref 70–99)
Potassium: 4.8 mmol/L (ref 3.5–5.2)
Sodium: 143 mmol/L (ref 134–144)
Total Protein: 6.3 g/dL (ref 6.0–8.5)
eGFR: 69 mL/min/{1.73_m2} (ref >59)

## 2024-03-28 LAB — MAGNESIUM: Magnesium: 2.4 mg/dL — ABNORMAL HIGH (ref 1.6–2.3)

## 2024-04-11 ENCOUNTER — Other Ambulatory Visit (HOSPITAL_COMMUNITY): Payer: Self-pay

## 2024-04-18 DIAGNOSIS — Z124 Encounter for screening for malignant neoplasm of cervix: Secondary | ICD-10-CM | POA: Diagnosis not present

## 2024-04-18 DIAGNOSIS — Z01419 Encounter for gynecological examination (general) (routine) without abnormal findings: Secondary | ICD-10-CM | POA: Diagnosis not present

## 2024-04-18 DIAGNOSIS — Z01411 Encounter for gynecological examination (general) (routine) with abnormal findings: Secondary | ICD-10-CM | POA: Diagnosis not present

## 2024-04-18 DIAGNOSIS — Z1331 Encounter for screening for depression: Secondary | ICD-10-CM | POA: Diagnosis not present

## 2024-05-02 DIAGNOSIS — I10 Essential (primary) hypertension: Secondary | ICD-10-CM | POA: Diagnosis not present

## 2024-05-02 DIAGNOSIS — K219 Gastro-esophageal reflux disease without esophagitis: Secondary | ICD-10-CM | POA: Diagnosis not present

## 2024-05-02 DIAGNOSIS — I712 Thoracic aortic aneurysm, without rupture, unspecified: Secondary | ICD-10-CM | POA: Diagnosis not present

## 2024-05-02 DIAGNOSIS — Z0000 Encounter for general adult medical examination without abnormal findings: Secondary | ICD-10-CM | POA: Diagnosis not present

## 2024-05-02 DIAGNOSIS — Z1331 Encounter for screening for depression: Secondary | ICD-10-CM | POA: Diagnosis not present

## 2024-05-02 DIAGNOSIS — J449 Chronic obstructive pulmonary disease, unspecified: Secondary | ICD-10-CM | POA: Diagnosis not present

## 2024-05-02 DIAGNOSIS — Z23 Encounter for immunization: Secondary | ICD-10-CM | POA: Diagnosis not present

## 2024-05-02 DIAGNOSIS — R7303 Prediabetes: Secondary | ICD-10-CM | POA: Diagnosis not present

## 2024-05-02 DIAGNOSIS — E039 Hypothyroidism, unspecified: Secondary | ICD-10-CM | POA: Diagnosis not present

## 2024-05-02 DIAGNOSIS — K760 Fatty (change of) liver, not elsewhere classified: Secondary | ICD-10-CM | POA: Diagnosis not present

## 2024-05-02 DIAGNOSIS — M8580 Other specified disorders of bone density and structure, unspecified site: Secondary | ICD-10-CM | POA: Diagnosis not present

## 2024-05-02 DIAGNOSIS — E7800 Pure hypercholesterolemia, unspecified: Secondary | ICD-10-CM | POA: Diagnosis not present

## 2024-05-04 ENCOUNTER — Other Ambulatory Visit: Payer: Self-pay | Admitting: Thoracic Surgery (Cardiothoracic Vascular Surgery)

## 2024-05-04 ENCOUNTER — Other Ambulatory Visit (HOSPITAL_COMMUNITY): Payer: Self-pay

## 2024-05-04 ENCOUNTER — Other Ambulatory Visit: Payer: Self-pay

## 2024-05-04 DIAGNOSIS — I7121 Aneurysm of the ascending aorta, without rupture: Secondary | ICD-10-CM

## 2024-05-19 ENCOUNTER — Ambulatory Visit: Admitting: Podiatry

## 2024-05-19 ENCOUNTER — Encounter: Payer: Self-pay | Admitting: Podiatry

## 2024-05-19 DIAGNOSIS — L603 Nail dystrophy: Secondary | ICD-10-CM | POA: Diagnosis not present

## 2024-05-19 NOTE — Patient Instructions (Signed)

## 2024-05-19 NOTE — Progress Notes (Unsigned)
 Right hallux nail had some split Left hallux had ingrown that resolved medial border no infection   Also complains some cramping to arches  Discussed good shoegear and avoiding barefoot ambulation

## 2024-05-29 ENCOUNTER — Ambulatory Visit: Attending: Cardiology | Admitting: Pharmacist

## 2024-05-29 DIAGNOSIS — E785 Hyperlipidemia, unspecified: Secondary | ICD-10-CM | POA: Insufficient documentation

## 2024-05-29 DIAGNOSIS — R748 Abnormal levels of other serum enzymes: Secondary | ICD-10-CM | POA: Diagnosis not present

## 2024-05-29 DIAGNOSIS — I7121 Aneurysm of the ascending aorta, without rupture: Secondary | ICD-10-CM | POA: Diagnosis not present

## 2024-05-29 NOTE — Assessment & Plan Note (Signed)
 Assessment: Patient with increase in ALT on atorvastatin  slightly improved with decreased dose to 40 mg. LP(a) 326 Her last LDL-C on atorvastatin  40 and ezetimibe  10 was 59 We talked about the reasoning behind a more aggressive goal of less than 55 however this is not supported by a large amount of data We discussed that mild increases in ALT from statins are generally not dangerous Possible that increases in ALT are from fatty liver We discussed PCSK9.  Reviewed injection technique, side effects in detail, clinical trial data, cost. If patient were to pursue PCSK9 inhibitor she would want to stop statin altogether, had suggested we could try decreasing statin dose and adding PCSK9 but she much rather swap out ALT is not dangerously high and would be reasonable to continue current therapy Discussed the importance of diet and exercise on risk factors Patient would like to recheck labs today before making a decision  Plan: Check LFTs, lipid panel and ApoB today We discussed with patient tomorrow and she will make her final decision on if she would like to change medication therapy

## 2024-05-29 NOTE — Progress Notes (Signed)
 Patient ID: [name] [name]                 DOB: 1956-08-14                    MRN: [id number]      HPI: [name] [name] is a 68 y.o. female patient referred to lipid clinic by Raphael Bring, PA. PMH is significant for coronary calcification by CT, ascending TAA, HTN, elevated LP(a), COPD followed by pulmonology, hepatic steatosis, aortic atherosclerosis, pre-diabetes, palpitations (2018 monitor with 1% PVCs, <1% PACs, brief PAT, brief 2 beat junctional escape during sleep), bronchiectasis.   Patient presents today accompanied by her husband.  Patient reports muscle aches with both rosuvastatin  and atorvastatin , worse with rosuvastatin .  Previously on atorvastatin  80 mg but decreased due to concern with elevated LFTs.  Appears ALT started trending up after changing to atorvastatin . ALT trend 37>49>62> 59.  Decreased from 62 to 59 when she cut atorvastatin  down to 40 mg.  Her LDL-C in March was 59 on atorvastatin  40 and ezetimibe  10 mg daily.  Patient's brother also had elevated liver enzymes on atorvastatin  and was switched to Repatha.  She does not know how elevated his enzymes were.  Patient does have known fatty liver.  She has been working on her diet and exercise and losing weight.  She wants to get her cholesterol down as low as possible due to her aneurysm.  We discussed holding atorvastatin  to see if her muscle aches and LFTs improved however patient nervous about being off cholesterol medication.  We discussed PCSK9.  Reviewed injection technique, side effects in detail, clinical trial data, cost.  Current Medications: atorvastatin  40mg , ezetimibe  10mg  daily Intolerances: rosuvastatin  10,40mg  (muscle aches), atorvastatin  (muscle aches) Risk Factors: Ascending TAA, hypertension, elevated LP(a), age LDL-C goal: Less than 70 (could consider less than 55 due to LP(a)) ApoB goal: <80 or <70  Diet: Not discussed in detail today  Exercise: Patient reports exercising but was not discussed in  detail  Family History:  Family History  Problem Relation Age of Onset   Colon cancer Mother    Breast cancer Maternal Aunt    Allergies Brother     Social History:  Social History   Socioeconomic History   Marital status: Married    Spouse name: Not on file   Number of children: Not on file   Years of education: Not on file   Highest education level: Not on file  Occupational History   Occupation: Emergency planning/management officer for [employer]    Employer: [employer]  Tobacco Use   Smoking status: Former    Current packs/day: 0.00    Types: Cigarettes    Start date: 12/01/2003    Quit date: 01/06/2013    Years since quitting: 11.4   Smokeless tobacco: Never  Vaping Use   Vaping status: Never Used  Substance and Sexual Activity   Alcohol use: Yes    Comment: 2 beers per week   Drug use: No   Sexual activity: Yes  Other Topics Concern   Not on file  Social History Narrative   Not on file   Social Drivers of Health   Financial Resource Strain: Not on file  Food Insecurity: Not on file  Transportation Needs: Not on file  Physical Activity: Not on file  Stress: Not on file  Social Connections: Not on file  Intimate Partner Violence: Not on file     Labs: Lipid Panel  LP(a) 326.1  Component Value Date/Time   CHOL 127 11/03/2023 1015   TRIG 70 11/03/2023 1015   HDL 52 11/03/2023 1015   CHOLHDL 2.4 11/03/2023 1015   LDLCALC 61 11/03/2023 1015   LABVLDL 14 11/03/2023 1015    Past Medical History:  Diagnosis Date   Arthritis    right knee, left hip   Carpal tunnel syndrome of left wrist 05/2017   COPD, severe (HCC)    SOB with ADLs per pt. - no O2   Cubital tunnel syndrome on left 05/2017   Dental crowns present    also dental implants   Esophageal reflux    Family history of adverse reaction to anesthesia    pt's mother has hx. of agitation and hallucinations post-op   History of asthma    as a child   Hypothyroidism (acquired)    Tachycardia    Holter  monitor 06/08/2017 - 06/10/2017:  does not have results yet    Current Outpatient Medications on File Prior to Visit  Medication Sig Dispense Refill   albuterol  (VENTOLIN  HFA) 108 (90 Base) MCG/ACT inhaler Inhale 2 puffs into the lungs every 4 (four) hours as needed for wheezing or shortness of breath.     amLODipine  (NORVASC ) 2.5 MG tablet Take 1 tablet (2.5 mg total) by mouth daily. 90 tablet 3   aspirin EC 81 MG tablet Take 81 mg by mouth daily. Swallow whole.     atorvastatin  (LIPITOR) 80 MG tablet Take 1 tablet (80 mg total) by mouth daily. (Patient taking differently: Take 80 mg by mouth daily. Pt says takes 40 mg, cuts 80 mg in half) 90 tablet 3   budeson-glycopyrrolate -formoterol  (BREZTRI  AEROSPHERE) 160-9-4.8 MCG/ACT AERO inhaler Inhale 2 puffs into the lungs in the morning and at bedtime. 32.1 g 4   cholecalciferol (VITAMIN D) 1000 units tablet Take 1,000 Units by mouth daily.     ezetimibe  (ZETIA ) 10 MG tablet Take 1 tablet (10 mg) by mouth daily. 90 tablet 2   levothyroxine  (SYNTHROID ) 100 MCG tablet Take 1 tablet (100 mcg total) by mouth in the morning on an empty stomach. 90 tablet 3   losartan  (COZAAR ) 25 MG tablet Take 1 tablet (25 mg total) by mouth daily. 90 tablet 3   Multiple Vitamin (MULTIVITAMIN) tablet Take 1 tablet by mouth daily.     omeprazole  (PRILOSEC) 20 MG capsule Take 1 capsule (20 mg total) by mouth daily. 90 capsule 3   No current facility-administered medications on file prior to visit.    Allergies  Allergen Reactions   Peanut-Containing Drug Products Shortness Of Breath    ESTONIA NUTS   Quinolones     Cannot have due to aneurysm.   Codeine Other (See Comments)    UNKNOWN   Other Other (See Comments)    Assessment/Plan:  1. Hyperlipidemia -  Hyperlipidemia Assessment: Patient with increase in ALT on atorvastatin  slightly improved with decreased dose to 40 mg. LP(a) 326 Her last LDL-C on atorvastatin  40 and ezetimibe  10 was 59 We talked about the  reasoning behind a more aggressive goal of less than 55 however this is not supported by a large amount of data We discussed that mild increases in ALT from statins are generally not dangerous Possible that increases in ALT are from fatty liver We discussed PCSK9.  Reviewed injection technique, side effects in detail, clinical trial data, cost. If patient were to pursue PCSK9 inhibitor she would want to stop statin altogether, had suggested we could try decreasing statin dose  and adding PCSK9 but she much rather swap out ALT is not dangerously high and would be reasonable to continue current therapy Discussed the importance of diet and exercise on risk factors Patient would like to recheck labs today before making a decision  Plan: Check LFTs, lipid panel and ApoB today We discussed with patient tomorrow and she will make her final decision on if she would like to change medication therapy    Thank you,  Andrey Hoobler D Hiep Ollis, Pharm.[name] [name], CPP [facility] [address], [place], [place] [address]  Phone: [phone]; Fax: [phone]

## 2024-05-30 ENCOUNTER — Telehealth: Payer: Self-pay

## 2024-05-30 ENCOUNTER — Other Ambulatory Visit (HOSPITAL_COMMUNITY): Payer: Self-pay

## 2024-05-30 ENCOUNTER — Telehealth: Payer: Self-pay | Admitting: Pharmacist

## 2024-05-30 ENCOUNTER — Ambulatory Visit: Payer: Self-pay | Admitting: Pharmacist

## 2024-05-30 DIAGNOSIS — R748 Abnormal levels of other serum enzymes: Secondary | ICD-10-CM

## 2024-05-30 DIAGNOSIS — E785 Hyperlipidemia, unspecified: Secondary | ICD-10-CM

## 2024-05-30 LAB — HEPATIC FUNCTION PANEL
ALT: 56 IU/L — ABNORMAL HIGH (ref 0–32)
AST: 46 IU/L — ABNORMAL HIGH (ref 0–40)
Albumin: 4.4 g/dL (ref 3.9–4.9)
Alkaline Phosphatase: 93 IU/L (ref 44–121)
Bilirubin Total: 0.7 mg/dL (ref 0.0–1.2)
Bilirubin, Direct: 0.22 mg/dL (ref 0.00–0.40)
Total Protein: 6.8 g/dL (ref 6.0–8.5)

## 2024-05-30 LAB — LIPID PANEL
Chol/HDL Ratio: 2.4 ratio (ref 0.0–4.4)
Cholesterol, Total: 129 mg/dL (ref 100–199)
HDL: 53 mg/dL (ref >39)
LDL Chol Calc (NIH): 59 mg/dL (ref 0–99)
Triglycerides: 90 mg/dL (ref 0–149)
VLDL Cholesterol Cal: 17 mg/dL (ref 5–40)

## 2024-05-30 LAB — APOLIPOPROTEIN B: Apolipoprotein B: 67 mg/dL (ref <90)

## 2024-05-30 NOTE — Telephone Encounter (Signed)
 Pharmacy Patient Advocate Encounter   Received notification from Physician's Office that prior authorization for REPATHA is required/requested.   Insurance verification completed.   The patient is insured through [facility] ADVANTAGE/RX ADVANCE .   Per test claim: PA required; PA submitted to above mentioned insurance via CoverMyMeds Key/confirmation #/EOC [id number] Status is pending

## 2024-05-30 NOTE — Telephone Encounter (Signed)
 Pharmacy Patient Advocate Encounter  Received notification from [facility] ADVANTAGE/RX ADVANCE that Prior Authorization for REPATHA has been APPROVED from 05/30/24 to 11/26/24. Ran test claim, Copay is $47. This test claim was processed through [facility]- copay amounts may vary at other pharmacies due to pharmacy/plan contracts, or as the patient moves through the different stages of their insurance plan.

## 2024-05-30 NOTE — Telephone Encounter (Signed)
 Patient concerned that her ALT and AST has increased again. LDL-C is good. She does have muscle aches on atorvastatin  as well- had on rosuvastatin  too. Would like to try Repatha instead of statin

## 2024-05-30 NOTE — Telephone Encounter (Signed)
 ERROR

## 2024-05-30 NOTE — Telephone Encounter (Signed)
 PA request has been Approved. New Encounter has been or will be created for follow up. For additional info see Pharmacy Prior Auth telephone encounter from 05/30/24.

## 2024-05-31 ENCOUNTER — Other Ambulatory Visit (HOSPITAL_COMMUNITY): Payer: Self-pay

## 2024-05-31 ENCOUNTER — Other Ambulatory Visit: Payer: Self-pay

## 2024-05-31 MED ORDER — REPATHA SURECLICK 140 MG/ML ~~LOC~~ SOAJ
1.0000 mL | SUBCUTANEOUS | 3 refills | Status: AC
  Filled 2024-05-31 – 2024-06-01 (×2): qty 6, 84d supply, fill #0
  Filled 2024-08-15: qty 6, 84d supply, fill #1
  Filled 2024-10-23: qty 6, 84d supply, fill #2

## 2024-05-31 NOTE — Telephone Encounter (Signed)
 Patient aware of approval. Rx sent to [facility] per request. Labs in 3 months.

## 2024-06-01 ENCOUNTER — Other Ambulatory Visit (HOSPITAL_COMMUNITY): Payer: Self-pay

## 2024-06-01 ENCOUNTER — Other Ambulatory Visit: Payer: Self-pay

## 2024-06-06 ENCOUNTER — Encounter: Payer: Self-pay | Admitting: Internal Medicine

## 2024-06-06 ENCOUNTER — Other Ambulatory Visit (HOSPITAL_COMMUNITY): Payer: Self-pay

## 2024-06-08 NOTE — Addendum Note (Signed)
 Addended by: Sammie Schermerhorn on: 06/08/2024 01:52 PM   Modules accepted: Orders

## 2024-06-08 NOTE — Telephone Encounter (Signed)
 Thukkani, Arun K, MD to [place], RN  Tanda Petri, RN (Selected Message) AT    06/07/24 10:41 AM Yes she can stop it

## 2024-06-15 ENCOUNTER — Other Ambulatory Visit (HOSPITAL_COMMUNITY): Payer: Self-pay

## 2024-06-20 ENCOUNTER — Ambulatory Visit (HOSPITAL_COMMUNITY)
Admission: RE | Admit: 2024-06-20 | Discharge: 2024-06-20 | Discharge status: Home / Self Care | Source: Ambulatory Visit | Attending: Thoracic Surgery (Cardiothoracic Vascular Surgery)

## 2024-06-20 DIAGNOSIS — I2510 Atherosclerotic heart disease of native coronary artery without angina pectoris: Secondary | ICD-10-CM | POA: Insufficient documentation

## 2024-06-20 DIAGNOSIS — I7121 Aneurysm of the ascending aorta, without rupture: Secondary | ICD-10-CM | POA: Insufficient documentation

## 2024-06-20 DIAGNOSIS — M8580 Other specified disorders of bone density and structure, unspecified site: Secondary | ICD-10-CM | POA: Diagnosis not present

## 2024-06-20 DIAGNOSIS — I700 Atherosclerosis of aorta: Secondary | ICD-10-CM | POA: Insufficient documentation

## 2024-06-20 DIAGNOSIS — K449 Diaphragmatic hernia without obstruction or gangrene: Secondary | ICD-10-CM | POA: Insufficient documentation

## 2024-06-20 DIAGNOSIS — R918 Other nonspecific abnormal finding of lung field: Secondary | ICD-10-CM | POA: Insufficient documentation

## 2024-06-20 DIAGNOSIS — J439 Emphysema, unspecified: Secondary | ICD-10-CM | POA: Diagnosis not present

## 2024-06-27 ENCOUNTER — Ambulatory Visit

## 2024-07-10 ENCOUNTER — Other Ambulatory Visit (HOSPITAL_COMMUNITY): Payer: Self-pay

## 2024-07-10 NOTE — Progress Notes (Signed)
 [address] [place] [address]             [phone]            Aadvika A Yandell [id number] [birth year]   History of Present Illness:  Ms. Senetra Dillin is a 68 year old female with medical history of COPD, GERD and hyperlipidemia who presents for continued surveillance of ascending thoracic aortic aneurysm.  She has been followed since 2022 and aneurysm has remained stable in size.  Aneurysm has measured 4.6 cm -4.7 cm consistently.  Her family has a history of aneurysms and her father required a repair.  Per Dr. Shyrl will recommend elective repair at 5 cm.  On CT of chest from 05/2024 aneurysm measured 4.5 cm.  Prior echocardiogram showed normal aortic valve structure.  She presents to the clinic today with her husband.  Her blood pressure is well-controlled with current medication therapy.  She reports home readings that are 110-120s/70s.  She does exercise by using an elliptical machine.  She denies heavy lifting.  She reports that recently she has noticed that her heart has been racing when laying down.  It is not consistent.  She has not counted her pulse during these episodes.  She states last time this happened was over 1 week ago.  She does have some shortness of breath due to her COPD but it is not exacerbated at this time.      Current Outpatient Medications on File Prior to Visit  Medication Sig Dispense Refill   albuterol  (VENTOLIN  HFA) 108 (90 Base) MCG/ACT inhaler Inhale 2 puffs into the lungs every 4 (four) hours as needed for wheezing or shortness of breath.     amLODipine  (NORVASC ) 2.5 MG tablet Take 1 tablet (2.5 mg total) by mouth daily. 90 tablet 3   aspirin EC 81 MG tablet Take 81 mg by mouth daily. Swallow whole.     budeson-glycopyrrolate -formoterol  (BREZTRI  AEROSPHERE) 160-9-4.8 MCG/ACT AERO inhaler Inhale 2 puffs into the lungs in the morning and at bedtime. 32.1 g 4   cholecalciferol (VITAMIN D) 1000 units tablet Take 1,000  Units by mouth daily.     Evolocumab  (REPATHA  SURECLICK) 140 MG/ML SOAJ Inject 140 mg into the skin every 14 (fourteen) days. 6 mL 3   levothyroxine  (SYNTHROID ) 100 MCG tablet Take 1 tablet (100 mcg total) by mouth in the morning on an empty stomach. 90 tablet 3   losartan  (COZAAR ) 25 MG tablet Take 1 tablet (25 mg total) by mouth daily. 90 tablet 3   Multiple Vitamin (MULTIVITAMIN) tablet Take 1 tablet by mouth daily.     omeprazole  (PRILOSEC) 20 MG capsule Take 1 capsule (20 mg total) by mouth daily. 90 capsule 3   No current facility-administered medications on file prior to visit.     ROS: Review of Systems  Respiratory:  Positive for shortness of breath.   Cardiovascular:  Negative for chest pain and leg swelling.     BP 110/76   Pulse 67   Resp 20   Ht 5' 7 (1.702 m)   Wt 186 lb (84.4 kg)   SpO2 95% Comment: RA  BMI 29.13 kg/m   Physical Exam Constitutional:      Appearance: Normal appearance.  HENT:     Head: Normocephalic and atraumatic.  Cardiovascular:     Rate and Rhythm: Normal rate and regular rhythm.     Heart sounds: Normal heart sounds, S1 normal and  S2 normal.  Pulmonary:     Effort: Pulmonary effort is normal.     Breath sounds: Normal breath sounds.  Musculoskeletal:     Cervical back: Normal range of motion.  Skin:    General: Skin is warm and dry.  Neurological:     General: No focal deficit present.     Mental Status: She is alert.      Imaging: CLINICAL DATA:  Presents for imaging follow-up of lung nodules and ascending aortic aneurysm.   EXAM: CT CHEST WITHOUT CONTRAST   TECHNIQUE: Multidetector CT imaging of the chest was performed following the standard protocol without IV contrast.   RADIATION DOSE REDUCTION: This exam was performed according to the departmental dose-optimization program which includes automated exposure control, adjustment of the mA and/or kV according to patient size and/or use of iterative reconstruction  technique.   COMPARISON:  Multiple prior chest CTs, some of which were for lung cancer screening. The 3 most recent are all without contrast and dated 06/18/2023, 12/24/2022 and 09/14/2022. Choose fistula   FINDINGS: Cardiovascular: There are small amounts of scattered three-vessel calcific plaque in the coronary arteries. No pericardial effusion or cardiomegaly.   The pulmonary arteries and veins are normal in caliber. There are mild calcific plaques in the aorta and great vessels.   The mid ascending aorta measures 4.4 cm transverse on 601:118 and 4.5 cm AP on 602:98. Previous reported measurements of 4.3 x 4.6 cm.   Remaining thoracic aorta is within normal caliber limits, slight tortuosity in the descending segment.   Mediastinum/Nodes: Old right hemithyroidectomy. Unremarkable atrophic left lobe. Axillary spaces are clear.   There is a tiny hiatal hernia. Negative thoracic esophagus, thoracic trachea and main bronchi. No intrathoracic or supraclavicular adenopathy is seen without contrast.   Lungs/Pleura: Advanced panlobular emphysema predominating in the upper lobes. There is linear scarring in both apices in both bases.   An irregular nodule is again noted in the posterior segment of the right upper lobe measuring 2.7 x 1.3 cm on 302:53, with stranding to the hilar and adjacent lateral pleural surface.   Size and configuration unchanged since 09/14/2022. Authors of previous reports have suggested an infectious or inflammatory basis for this, but PET-CT has never been performed in our system on this and this was not seen on studies prior to 06/08/2022.   If clinically warranted, consider follow-up PET-CT to assess for hypermetabolism or attention for continued size stability on CT follow-up for the aortic aneurysm.   There is a stable 6 mm left lower lobe superior segment nodule on 302:71.   There previously was a small cavitary nodule in the posterior segment of  the left upper lobe, with only linear scar-like opacity noted in the area today.   The lungs are otherwise clear. There is no consolidation, effusion or further nodules.   Upper Abdomen: No acute abnormality.   Musculoskeletal: There is thoracic kyphosis, osteopenia and degenerative change. No spinal compression fractures. No acute or other significant osseous findings. Unremarkable chest wall.   IMPRESSION: 1. Stable 2.7 x 1.3 cm irregular nodule in the posterior segment of the right upper lobe with stranding to the right hilum and adjacent lateral pleural surface. If clinically warranted, consider follow-up PET-CT to assess for hypermetabolism or attention for continued size stability on CT follow-up for the aortic aneurysm. 2. Stable 6 mm left lower lobe superior segment nodule. 3. Advanced emphysema. 4. Aortic and coronary artery atherosclerosis. 5. 4.4 x 4.5 cm aneurysm mid ascending aorta,  previously 4.3 x 4.6 cm. Recommend semi-annual imaging followup by CTA or MRA and referral to cardiothoracic surgery if not already obtained. This recommendation follows 2010 ACCF/AHA/AATS/ACR/ASA/SCA/SCAI/SIR/ STS/SVM Guidelines for the Diagnosis and Management of Patients With Thoracic Aortic Disease. Circulation. 2010; 121: Z733-z630. Aortic aneurysm NOS (ICD10-I71.9). 6. Tiny hiatal hernia. 7. Osteopenia, kyphosis and degenerative change.   Aortic Atherosclerosis (ICD10-I70.0) and Emphysema (ICD10-J43.9).     Electronically Signed   By: Francis Quam M.D.   On: 06/25/2024 22:39     A/P: Aneurysm of ascending aorta without rupture (HCC) -4.5 cm ascending thoracic aortic aneurysm on CT of chest. We discussed the natural history and and risk factors for growth of ascending aortic aneurysms. Discussed recommendations to minimize the risk of further expansion or dissection including careful blood pressure control, avoidance of contact sports and heavy lifting, attention to lipid  management.  We covered the importance of continued smoking cessation.  The patient does not yet meet surgical criteria of >5.5cm. The patient is aware of signs and symptoms of aortic dissection and when to present to the emergency department   -Follow-up in 6 months for continued surveillance -Follow up with cardiologist for further evaluation of tachycardic episodes while laying down.  Pulmonary Nodules -Stable 2.7 x 1.3 cm irregular nodule in the posterior segment of the right upper lobe with stranding to the right hilum and adjacent lateral pleural surface. Stable 6 mm left lower lobe superior segment nodule.  Continue to follow up with pulmonologist for nodules.     Risk Modification:  Statin:  not prescribed  Smoking cessation instruction/counseling given:  commended patient for quitting and reviewed strategies for preventing relapses  Patient was counseled on importance of Blood Pressure Control  They are instructed to contact their Primary Care Physician if they start to have blood pressure readings over 130s/90s. Do not ever stop blood pressure medications on your own, unless instructed by healthcare professional.  Please avoid use of Fluoroquinolones as this can potentially increase your risk of Aortic Rupture and/or Dissection  Patient educated on signs and symptoms of Aortic Dissection, handout also provided in AVS  Manuelita CHRISTELLA Rough, PA-C 07/11/24

## 2024-07-11 ENCOUNTER — Ambulatory Visit

## 2024-07-11 ENCOUNTER — Other Ambulatory Visit (HOSPITAL_COMMUNITY): Payer: Self-pay

## 2024-07-11 ENCOUNTER — Other Ambulatory Visit: Payer: Self-pay

## 2024-07-11 VITALS — BP 110/76 | HR 67 | Resp 20 | Ht 67.0 in | Wt 186.0 lb

## 2024-07-11 DIAGNOSIS — I7121 Aneurysm of the ascending aorta, without rupture: Secondary | ICD-10-CM | POA: Diagnosis not present

## 2024-07-11 MED ORDER — OMEPRAZOLE 20 MG PO CPDR
20.0000 mg | DELAYED_RELEASE_CAPSULE | Freq: Every day | ORAL | 0 refills | Status: DC
  Filled 2024-07-11: qty 90, 90d supply, fill #0

## 2024-07-11 NOTE — Patient Instructions (Signed)

## 2024-07-12 ENCOUNTER — Other Ambulatory Visit (HOSPITAL_COMMUNITY): Payer: Self-pay

## 2024-07-12 MED ORDER — OMEPRAZOLE 20 MG PO CPDR
20.0000 mg | DELAYED_RELEASE_CAPSULE | Freq: Two times a day (BID) | ORAL | 3 refills | Status: AC
  Filled 2024-07-12 – 2024-08-30 (×6): qty 180, 90d supply, fill #0
  Filled 2024-11-18: qty 180, 90d supply, fill #1
  Filled ????-??-??: fill #0

## 2024-07-25 DIAGNOSIS — D1723 Benign lipomatous neoplasm of skin and subcutaneous tissue of right leg: Secondary | ICD-10-CM | POA: Diagnosis not present

## 2024-08-09 ENCOUNTER — Other Ambulatory Visit: Payer: Self-pay

## 2024-08-09 ENCOUNTER — Other Ambulatory Visit (HOSPITAL_COMMUNITY): Payer: Self-pay

## 2024-08-15 ENCOUNTER — Other Ambulatory Visit (HOSPITAL_COMMUNITY): Payer: Self-pay

## 2024-08-15 ENCOUNTER — Encounter (HOSPITAL_COMMUNITY): Payer: Self-pay

## 2024-08-21 ENCOUNTER — Other Ambulatory Visit (HOSPITAL_COMMUNITY): Payer: Self-pay

## 2024-08-21 ENCOUNTER — Other Ambulatory Visit: Payer: Self-pay

## 2024-08-21 MED ORDER — LEVOTHYROXINE SODIUM 100 MCG PO TABS
100.0000 ug | ORAL_TABLET | Freq: Every morning | ORAL | 3 refills | Status: AC
  Filled 2024-08-21: qty 90, 90d supply, fill #0
  Filled 2024-11-15: qty 90, 90d supply, fill #1
  Filled 2024-12-12: qty 90, 90d supply, fill #2

## 2024-08-25 ENCOUNTER — Other Ambulatory Visit: Payer: Self-pay

## 2024-08-28 ENCOUNTER — Other Ambulatory Visit (HOSPITAL_COMMUNITY): Payer: Self-pay

## 2024-08-28 ENCOUNTER — Other Ambulatory Visit: Payer: Self-pay | Admitting: Student

## 2024-08-28 ENCOUNTER — Other Ambulatory Visit: Payer: Self-pay

## 2024-08-28 ENCOUNTER — Telehealth: Payer: Self-pay

## 2024-08-28 DIAGNOSIS — Z86018 Personal history of other benign neoplasm: Secondary | ICD-10-CM | POA: Diagnosis not present

## 2024-08-28 DIAGNOSIS — K760 Fatty (change of) liver, not elsewhere classified: Secondary | ICD-10-CM

## 2024-08-28 DIAGNOSIS — I712 Thoracic aortic aneurysm, without rupture, unspecified: Secondary | ICD-10-CM | POA: Diagnosis not present

## 2024-08-28 DIAGNOSIS — R000 Tachycardia, unspecified: Secondary | ICD-10-CM | POA: Diagnosis not present

## 2024-08-28 DIAGNOSIS — Z83719 Family history of colon polyps, unspecified: Secondary | ICD-10-CM | POA: Diagnosis not present

## 2024-08-28 DIAGNOSIS — K219 Gastro-esophageal reflux disease without esophagitis: Secondary | ICD-10-CM | POA: Diagnosis not present

## 2024-08-28 NOTE — Telephone Encounter (Signed)
 Patient is returning call.

## 2024-08-28 NOTE — Telephone Encounter (Signed)
   Pre-operative Risk Assessment    Patient Name: [name] [name]  DOB: 1956-03-15 MRN: [id number]   Date of last office visit: 03/27/24 DAYNA DUNN, PA-C Date of next office visit: NONE   Request for Surgical Clearance    Procedure:  COLONOSCOPY  Date of Surgery:  Clearance 10/02/24                                 Surgeon:  DR JERRELL SOL Surgeon's Group or Practice Name:  [facility] Phone number:  [phone] Fax number:  [phone]   Type of Clearance Requested:   - Medical  - Pharmacy:  Hold Aspirin     Type of Anesthesia:  PROPOFOL    Additional requests/questions:    SignedLucie DELENA Ku   08/28/2024, 1:18 PM

## 2024-08-28 NOTE — Telephone Encounter (Signed)
 1st attempt to reach pt regarding surgical clearance and the need for an TELE appointment.  Left pt a detailed message to call back and get that scheduled.

## 2024-08-28 NOTE — Telephone Encounter (Signed)
 Primary Cardiologist:Arun MARLA Red, MD   Preoperative team, please contact this patient and set up a phone call appointment for further preoperative risk assessment. Please obtain consent and complete medication review. Thank you for your help.   I confirm that guidance regarding antiplatelet and oral anticoagulation therapy has been completed and, if necessary, noted below.  Per office protocol and pending no concerning cardiac symptoms at time of visit, she may hold aspirin for 7 days prior to procedure and should resume as soon as hemodynamically stable postoperatively.  I also confirmed the patient resides in the state of [place] . As per [facility] telemedicine laws, the patient must reside in the state in which the provider is licensed.   Rosaline EMERSON Bane, NP-C  08/28/2024, 1:28 PM [address] [place], [place] [address] Office [phone] Fax [phone]

## 2024-08-29 ENCOUNTER — Telehealth (HOSPITAL_BASED_OUTPATIENT_CLINIC_OR_DEPARTMENT_OTHER): Payer: Self-pay | Admitting: *Deleted

## 2024-08-29 ENCOUNTER — Ambulatory Visit: Payer: Self-pay

## 2024-08-29 NOTE — Telephone Encounter (Signed)
 Pt has been scheduled tele preop appt 09/12/24. Med rec and consent are done.     Patient Consent for Virtual Visit        [name] [name] has provided verbal consent on 08/29/2024 for a virtual visit (video or telephone).   CONSENT FOR VIRTUAL VISIT FOR:  [name] [name]  By participating in this virtual visit I agree to the following:  I hereby voluntarily request, consent and authorize [facility] and its employed or contracted physicians, physician assistants, nurse practitioners or other licensed health care professionals (the Practitioner), to provide me with telemedicine health care services (the "Services) as deemed necessary by the treating Practitioner. I acknowledge and consent to receive the Services by the Practitioner via telemedicine. I understand that the telemedicine visit will involve communicating with the Practitioner through live audiovisual communication technology and the disclosure of certain medical information by electronic transmission. I acknowledge that I have been given the opportunity to request an in-person assessment or other available alternative prior to the telemedicine visit and am voluntarily participating in the telemedicine visit.  I understand that I have the right to withhold or withdraw my consent to the use of telemedicine in the course of my care at any time, without affecting my right to future care or treatment, and that the Practitioner or I may terminate the telemedicine visit at any time. I understand that I have the right to inspect all information obtained and/or recorded in the course of the telemedicine visit and may receive copies of available information for a reasonable fee.  I understand that some of the potential risks of receiving the Services via telemedicine include:  Delay or interruption in medical evaluation due to technological equipment failure or disruption; Information transmitted may not be sufficient (e.g. poor  resolution of images) to allow for appropriate medical decision making by the Practitioner; and/or  In rare instances, security protocols could fail, causing a breach of personal health information.  Furthermore, I acknowledge that it is my responsibility to provide information about my medical history, conditions and care that is complete and accurate to the best of my ability. I acknowledge that Practitioner's advice, recommendations, and/or decision may be based on factors not within their control, such as incomplete or inaccurate data provided by me or distortions of diagnostic images or specimens that may result from electronic transmissions. I understand that the practice of medicine is not an exact science and that Practitioner makes no warranties or guarantees regarding treatment outcomes. I acknowledge that a copy of this consent can be made available to me via my patient portal [facility] MyChart), or I can request a printed copy by calling the office of [facility].    I understand that my insurance will be billed for this visit.   I have read or had this consent read to me. I understand the contents of this consent, which adequately explains the benefits and risks of the Services being provided via telemedicine.  I have been provided ample opportunity to ask questions regarding this consent and the Services and have had my questions answered to my satisfaction. I give my informed consent for the services to be provided through the use of telemedicine in my medical care

## 2024-08-29 NOTE — Telephone Encounter (Signed)
 Pt has been scheduled tele preop appt 09/12/24. Med rec and consent are done.

## 2024-08-29 NOTE — Telephone Encounter (Signed)
 FYI Only or Action Required?: Action required by provider: request for appointment.  Patient was last seen in primary care on .  Called Nurse Triage reporting Shortness of Breath.  Symptoms began several weeks ago.  Interventions attempted: Rest, hydration, or home remedies.  Symptoms are: unchanged.Shortness of breath not getting better.  Triage Disposition: See PCP Within 2 Weeks  Patient/caregiver understands and will follow disposition?:     Copied from CRM #[id number]. Topic: Clinical - Red Word Triage >> Aug 29, 2024  1:16 PM Corean SAUNDERS wrote: Red Word that prompted transfer to Nurse Triage: Shortness of breath that has been ongoing for 3 weeks. Reason for Disposition  [1] MILD longstanding difficulty breathing (e.g., minimal/no SOB at rest, SOB with walking, pulse < 100) AND [2] SAME as normal  Answer Assessment - Initial Assessment Questions 1. RESPIRATORY STATUS: Describe your breathing? (e.g., wheezing, shortness of breath, unable to speak, severe coughing)      SO 2. ONSET: When did this breathing problem begin?      3 weeks 3. PATTERN Does the difficult breathing come and go, or has it been constant since it started?      Comes and goes with exertion 4. SEVERITY: How bad is your breathing? (e.g., mild, moderate, severe)      moderate 5. RECURRENT SYMPTOM: Have you had difficulty breathing before? If Yes, ask: When was the last time? and What happened that time?      yes 6. CARDIAC HISTORY: Do you have any history of heart disease? (e.g., heart attack, angina, bypass surgery, angioplasty)      no 7. LUNG HISTORY: Do you have any history of lung disease?  (e.g., pulmonary embolus, asthma, emphysema)     yes 8. CAUSE: What do you think is causing the breathing problem?      unsure 9. OTHER SYMPTOMS: Do you have any other symptoms? (e.g., chest pain, cough, dizziness, fever, runny nose)     no 10. O2 SATURATION MONITOR:  Do you use an oxygen   saturation monitor (pulse oximeter) at home? If Yes, ask: What is your reading (oxygen  level) today? What is your usual oxygen  saturation reading? (e.g., 95%)       95 - 97% 11. PREGNANCY: Is there any chance you are pregnant? When was your last menstrual period?       no 12. TRAVEL: Have you traveled out of the country in the last month? (e.g., travel history, exposures)       no  Protocols used: Breathing Difficulty-A-AH

## 2024-08-30 ENCOUNTER — Encounter: Payer: Self-pay | Admitting: Pulmonary Disease

## 2024-08-30 ENCOUNTER — Other Ambulatory Visit (HOSPITAL_COMMUNITY): Payer: Self-pay

## 2024-08-31 ENCOUNTER — Other Ambulatory Visit: Payer: Self-pay

## 2024-08-31 ENCOUNTER — Other Ambulatory Visit (HOSPITAL_COMMUNITY): Payer: Self-pay

## 2024-08-31 MED ORDER — AIRSUPRA 90-80 MCG/ACT IN AERO
2.0000 | INHALATION_SPRAY | RESPIRATORY_TRACT | 3 refills | Status: AC | PRN
  Filled 2024-08-31: qty 10.7, 30d supply, fill #0

## 2024-08-31 NOTE — Telephone Encounter (Signed)
 Please advise if okay to send in refills of albuterol  says provider historical

## 2024-08-31 NOTE — Telephone Encounter (Signed)
 Newer evidence suggest that an inhaler called Airsupra works better than just albuterol  as a rescue inhaler.  It contains both albuterol  and steroid for better control of symptoms.  I have sent this prescription to her pharmacy.

## 2024-09-04 ENCOUNTER — Encounter: Payer: Self-pay | Admitting: Pharmacist

## 2024-09-05 ENCOUNTER — Ambulatory Visit (INDEPENDENT_AMBULATORY_CARE_PROVIDER_SITE_OTHER): Admitting: Pulmonary Disease

## 2024-09-05 ENCOUNTER — Encounter: Payer: Self-pay | Admitting: Pulmonary Disease

## 2024-09-05 ENCOUNTER — Ambulatory Visit (INDEPENDENT_AMBULATORY_CARE_PROVIDER_SITE_OTHER)

## 2024-09-05 VITALS — BP 132/80 | HR 68 | Temp 98.4°F | Ht 67.0 in | Wt 181.0 lb

## 2024-09-05 DIAGNOSIS — J45909 Unspecified asthma, uncomplicated: Secondary | ICD-10-CM

## 2024-09-05 DIAGNOSIS — J441 Chronic obstructive pulmonary disease with (acute) exacerbation: Secondary | ICD-10-CM | POA: Diagnosis not present

## 2024-09-05 DIAGNOSIS — R911 Solitary pulmonary nodule: Secondary | ICD-10-CM

## 2024-09-05 DIAGNOSIS — R918 Other nonspecific abnormal finding of lung field: Secondary | ICD-10-CM | POA: Diagnosis not present

## 2024-09-05 DIAGNOSIS — R059 Cough, unspecified: Secondary | ICD-10-CM | POA: Diagnosis not present

## 2024-09-05 DIAGNOSIS — J439 Emphysema, unspecified: Secondary | ICD-10-CM | POA: Diagnosis not present

## 2024-09-05 MED ORDER — PREDNISONE 10 MG PO TABS: 40.0000 mg | ORAL_TABLET | Freq: Every day | ORAL | 0 refills | Status: AC

## 2024-09-05 MED ORDER — AZITHROMYCIN 250 MG PO TABS: 250.0000 mg | ORAL_TABLET | Freq: Every day | ORAL | 0 refills | Status: DC

## 2024-09-05 NOTE — Progress Notes (Signed)
 [name] [name]    [id number]    November 17, 1956  Primary Care Physician:Henderson, Dorn LABOR, MD  Referring Physician: Charlott Dorn LABOR, MD [address] [place],  [place] [address]  Chief complaint:  Follow-up for COPD Acute visit for exacerbation  HPI: 68 y.o.  with history of COPD.  Previously followed by Dr. Darlean.  Has not been seen PCCM since 2013 Maintained on Spiriva.  Advair was started in June 2021 Complains of chronic dyspnea on exertion.  Occasional cough, no fevers or chills Other medical issues include allergies with postnasal drip, GERD for which she is on Prilosec twice daily.  She has occasional hoarseness of voice Evaluated by Dr. Gerldine ENT in April 2021 with laryngoscope showing vocal cord edema consistent with LPR  History notable for thoracic aortic aneurysm which was noted during follow-up for lung nodules.  The aneurysm is slowly increasing in size.  She has been evaluated by cardiothoracic surgery with continued surveillance recommended.  Lung nodules have subsequently resolved on follow-up CT scans  Finished pulmonary rehab 2022 Notes desats to 85% on exertion and has been getting supplemental oxygen    Interim history:  Discussed the use of AI scribe software for clinical note transcription with the patient, who gave verbal consent to proceed.  History of Present Illness  [name] [name] is a 68 year old female with severe COPD and asthma who presents with increased shortness of breath and a new cough.    Dyspnea - Worsening shortness of breath over the past three weeks - Now requires emergency inhaler twice daily, previously used once every couple of weeks - Exertional dyspnea with minimal activity, such as moving from the kitchen to the living room - No associated fever - No temporal relationship to recent flu and COVID vaccinations received two weeks ago  Cough - New onset of 'cackling' cough for three days - Cough is  violent and painful - Productive of phlegm with a tinge - Mucinex provides some relief - Coughing fit last night accompanied by wheezing, which is unusual for her  Wheezing - Wheezing occurred during a coughing fit last night - Wheezing is not typical for her usual symptoms  Environmental triggers - Historically, rainy, hot, and humid weather exacerbates breathing issues - Current symptoms persist beyond the change in weather  Chronic pulmonary disease management - History of severe COPD and asthma - Previously on Trelegy, now finds Breztri  more effective - Chest x-ray and CT scan in July were stable - Right lung nodule under surveillance   Relevant pulmonary history Pets: No pets Occupation: Retired Psychologist, occupational Exposures: No mold, hot tub, Financial controller.  No feather pillows or comforter Smoking history: 30-pack-year smoker.  Quit in 2007 Travel history: No significant travel history Relevant family history: No family history of lung disease  Outpatient Encounter Medications as of 09/05/2024  Medication Sig   Albuterol -Budesonide  (AIRSUPRA) 90-80 MCG/ACT AERO Inhale 2 puffs into the lungs every 4 (four) hours as needed.   amLODipine  (NORVASC ) 2.5 MG tablet Take 1 tablet (2.5 mg total) by mouth daily.   aspirin EC 81 MG tablet Take 81 mg by mouth daily. Swallow whole.   budeson-glycopyrrolate -formoterol  (BREZTRI  AEROSPHERE) 160-9-4.8 MCG/ACT AERO inhaler Inhale 2 puffs into the lungs in the morning and at bedtime.   cholecalciferol (VITAMIN D) 1000 units tablet Take 1,000 Units by mouth daily.   Evolocumab  (REPATHA  SURECLICK) 140 MG/ML SOAJ Inject 140 mg into the skin every 14 (fourteen)  days.   levothyroxine  (SYNTHROID ) 100 MCG tablet Take 1 tablet (100 mcg total) by mouth every morning.   Multiple Vitamin (MULTIVITAMIN) tablet Take 1 tablet by mouth daily.   omeprazole  (PRILOSEC) 20 MG capsule Take 1 capsule (20 mg total) by mouth 2 (two) times daily.   losartan  (COZAAR ) 25 MG tablet Take  1 tablet (25 mg total) by mouth daily. (Patient not taking: Reported on 09/05/2024)   No facility-administered encounter medications on file as of 09/05/2024.    Vitals:   09/05/24 1420  BP: 132/80  Pulse: 68  Temp: 98.4 F (36.9 C)  Height: 5' 7 (1.702 m)  Weight: 181 lb (82.1 kg)  SpO2: 94%  TempSrc: Oral  BMI (Calculated): 28.34     Physical Exam GEN: No acute distress. CV: Regular rate and rhythm, no murmurs. LUNGS: Clear to auscultation bilaterally, normal respiratory effort. SKIN JOINTS: Warm and dry, no rash.    Data Reviewed: Imaging: CT chest 08/21/2021-stable thoracic aortic aneurysm measuring 4.7 cm, mild coronary artery calcification, severe emphysema.    CT chest 06/08/2022-waxing and waning areas of nodularity bilaterally, 4.5 cm ascending aortic aneurysm.  CT chest 09/14/2022- Continued improvement in size of lung nodules.  CT chest 06/18/2023-stable ascending aortic aneurysm, stable lung nodule  CT chest 06/18/2023-stable ascending aorta, stable pulmonary nodules I have reviewed the images personally.  PFTs: 08/16/2012 FVC 3.71 [96%], FEV1 1.76 [50%], F/F 48, TLC 7.09 [125%], DLCO 17.75 [59%]  12/12/2021 FVC 2.94 [81%], FEV1 1.39 [50%], F/F 47, TLC 7.23 [127%], DLCO 13.74 [61%] Severe obstruction with hyperinflation, air trapping.  Moderate diffusion impairment  Labs: CBC 05/20/2021-WBC 8.5, eos 7%, absolute eosinophil count 595 CBC 12/08/2022-WBC 6.9, eos 2.8%, absolute eosinophil count 193 IgE 12/10/2022-41 Alpha-1 antitrypsin 12/10/2022-120, PI MM Assessment & Plan Asthma, COPD overlap syndrome with acute exacerbation Experiencing an acute exacerbation, characterized by increased dyspnea over the past three weeks and a new onset of a productive cough with purulent sputum. The cough is described as cackling and violent, causing discomfort. The exacerbation is severe enough to require daily use of her emergency inhaler, previously used only once every couple  of weeks. Environmental factors such as recent humid weather and a possible infection are considered likely contributors. - Prescribe a prednisone  burst to manage the acute exacerbation. - Order a chest x-ray to evaluate for possible infection. - Prescribe azithromycin  (Z-Pak) to address potential bacterial infection.  Asthma Asthma is managed with Breztri , which has been effective in controlling symptoms. However, the recent exacerbation of COPD has affected asthma control, leading to increased wheezing and coughing, particularly at night. - Continue Breztri  for asthma management. - Use Mucinex over-the-counter for chest congestion.  Pulmonary nodule The right lung pulmonary nodule has been stable since 2023 with no current changes or concerns.   Plan/Recommendations: Chest x-ray, Z-Pak, prednisone  40 mg a day for 5 days Continue Breztri  with spacer Follow-up CT scan in 1 year  I personally spent a total of 35 minutes in the care of the patient today including preparing to see the patient, getting/reviewing separately obtained history, performing a medically appropriate exam/evaluation, counseling and educating, placing orders, and communicating results.   Lonna Coder MD [facility] 09/05/2024, 2:28 PM  CC: Charlott Dorn LABOR, *

## 2024-09-05 NOTE — Patient Instructions (Signed)
  VISIT SUMMARY: During your visit, we addressed your worsening shortness of breath, new cough, and wheezing, which are likely due to an acute exacerbation of your COPD. We also discussed your asthma management and the stable condition of your right lung nodule.  YOUR PLAN: COPD WITH ACUTE EXACERBATION: You are experiencing a worsening of your COPD symptoms, including increased shortness of breath and a new, productive cough. -We are prescribing a prednisone  burst to help manage the acute exacerbation. -We will order a chest x-ray to check for any possible infection. -We are prescribing azithromycin  (Z-Pak) to treat any potential bacterial infection.  ASTHMA: Your asthma is generally well-managed with Breztri , but the recent COPD exacerbation has caused increased wheezing and coughing. -Continue using Breztri  for asthma management. -You can use Mucinex over-the-counter to help with chest congestion.  RIGHT LUNG PULMONARY NODULE: The nodule in your right lung has been stable with no changes or concerns. -No changes are needed at this time; we will continue to monitor it.

## 2024-09-06 DIAGNOSIS — K219 Gastro-esophageal reflux disease without esophagitis: Secondary | ICD-10-CM | POA: Diagnosis not present

## 2024-09-06 DIAGNOSIS — E7800 Pure hypercholesterolemia, unspecified: Secondary | ICD-10-CM | POA: Diagnosis not present

## 2024-09-06 DIAGNOSIS — J449 Chronic obstructive pulmonary disease, unspecified: Secondary | ICD-10-CM | POA: Diagnosis not present

## 2024-09-06 DIAGNOSIS — I712 Thoracic aortic aneurysm, without rupture, unspecified: Secondary | ICD-10-CM | POA: Diagnosis not present

## 2024-09-06 DIAGNOSIS — K760 Fatty (change of) liver, not elsewhere classified: Secondary | ICD-10-CM | POA: Diagnosis not present

## 2024-09-06 DIAGNOSIS — R7303 Prediabetes: Secondary | ICD-10-CM | POA: Diagnosis not present

## 2024-09-06 DIAGNOSIS — Z79899 Other long term (current) drug therapy: Secondary | ICD-10-CM | POA: Diagnosis not present

## 2024-09-06 DIAGNOSIS — I10 Essential (primary) hypertension: Secondary | ICD-10-CM | POA: Diagnosis not present

## 2024-09-08 ENCOUNTER — Ambulatory Visit: Payer: Self-pay | Admitting: Pulmonary Disease

## 2024-09-11 ENCOUNTER — Ambulatory Visit
Admission: RE | Admit: 2024-09-11 | Discharge: 2024-09-11 | Disposition: A | Discharge status: Home / Self Care | Source: Ambulatory Visit | Attending: Student | Admitting: Student

## 2024-09-11 DIAGNOSIS — K760 Fatty (change of) liver, not elsewhere classified: Secondary | ICD-10-CM

## 2024-09-12 ENCOUNTER — Ambulatory Visit: Attending: Cardiology | Admitting: Physician Assistant

## 2024-09-12 DIAGNOSIS — Z01810 Encounter for preprocedural cardiovascular examination: Secondary | ICD-10-CM | POA: Diagnosis not present

## 2024-09-12 NOTE — Progress Notes (Signed)
 Virtual Visit via Telephone Note   Because of [name] [name] co-morbid illnesses, she is at least at moderate risk for complications without adequate follow up.  This format is felt to be most appropriate for this patient at this time.  Due to technical limitations with video connection (technology), today's appointment will be conducted as an audio only telehealth visit, and [name] [name] verbally agreed to proceed in this manner.   All issues noted in this document were discussed and addressed.  No physical exam could be performed with this format.  Evaluation Performed:  Preoperative cardiovascular risk assessment _____________   Date:  09/12/2024   Patient ID:  [name] [name], DOB 04-Feb-1956, MRN [id number] Patient Location:  Home Provider location:   Office  Primary Care Provider:  Charlott Dorn DELENA, MD Primary Cardiologist:  [name] K Thukkani, MD  Chief Complaint / Patient Profile   68 y.o. y/o female with a h/o severe COPD, coronary calcifications by CT scan, hypertension, elevated LP(a), hepatic steatosis, aortic atherosclerosis, prediabetes, palpitations with monitor in 2018 showing 1% PVCs and less than 1% PACs, brief PAT, brief 2 beat junctional escape during sleep), EF 65%, ascending aortic aneurysm, hypothyroidism  who is pending colonoscopy with possible biopsy and presents today for telephonic preoperative cardiovascular risk assessment.  History of Present Illness    [name] [name] is a 68 y.o. female who presents via audio/video conferencing for a telehealth visit today.  Pt was last seen in cardiology clinic on 03/27/2024 by [name] Dunn, PA-C.  At that time [name] [name] was doing well.  The patient is now pending procedure as outlined above. Since her last visit, she states that she has been doing well from a cardiac standpoint.  She takes her time with activity due to her emphysema.  She feels like she does everything that she needs to do.  She is unable to push a  lawnmower or rake leaves but she does have a baby hedge tremor that she uses outside.  She also spends time on the elliptical and bike for exercise.  Overall, she does surpass 4 METS on the DASI.  Per office protocol she may hold aspirin for 7 days prior to procedure and should resume as soon as hemodynamically stable postoperatively.   Past Medical History    Past Medical History:  Diagnosis Date   Arthritis    right knee, left hip   Carpal tunnel syndrome of left wrist 05/2017   COPD, severe (HCC)    SOB with ADLs per pt. - no O2   Cubital tunnel syndrome on left 05/2017   Dental crowns present    also dental implants   Esophageal reflux    Family history of adverse reaction to anesthesia    pt's mother has hx. of agitation and hallucinations post-op   History of asthma    as a child   Hypothyroidism (acquired)    Tachycardia    Holter monitor 06/08/2017 - 06/10/2017:  does not have results yet   Past Surgical History:  Procedure Laterality Date   BREAST LUMPECTOMY Left    CARPAL TUNNEL RELEASE Left 06/17/2017   Procedure: CARPAL TUNNEL RELEASE;  Surgeon: Murrell Kuba, MD;  Location: [facility];  Service: Orthopedics;  Laterality: Left;   CATARACT EXTRACTION W/ INTRAOCULAR LENS IMPLANT Left    MASS EXCISION Left 03/26/2005   thumb   MULTIPLE TOOTH EXTRACTIONS     THYROIDECTOMY, PARTIAL     TONSILLECTOMY  ULNAR NERVE TRANSPOSITION Left 06/17/2017   Procedure: DECOMPRESSION  ULNAR NERVE LEFT ELBOW;  Surgeon: Murrell Kuba, MD;  Location: [facility];  Service: Orthopedics;  Laterality: Left;    Allergies  Allergies  Allergen Reactions   Peanut-Containing Drug Products Shortness Of Breath    ESTONIA NUTS   Quinolones     Cannot have due to aneurysm.   Codeine Other (See Comments)    UNKNOWN   Other Other (See Comments)    Home Medications    Prior to Admission medications   Medication Sig Start Date End Date Taking? Authorizing Provider   Albuterol -Budesonide  (AIRSUPRA) 90-80 MCG/ACT AERO Inhale 2 puffs into the lungs every 4 (four) hours as needed. 08/31/24 09/30/24  Mannam, Praveen, MD  amLODipine  (NORVASC ) 2.5 MG tablet Take 1 tablet (2.5 mg total) by mouth daily. 03/21/24     aspirin EC 81 MG tablet Take 81 mg by mouth daily. Swallow whole.    [provider]  azithromycin  (ZITHROMAX ) 250 MG tablet Take 1 tablet (250 mg total) by mouth daily. Take 2 tabs on day 1 and then 1 tab on day 2-4 09/05/24   Mannam, Praveen, MD  budeson-glycopyrrolate -formoterol  (BREZTRI  AEROSPHERE) 160-9-4.8 MCG/ACT AERO inhaler Inhale 2 puffs into the lungs in the morning and at bedtime. 03/22/24 03/22/25  Mannam, Praveen, MD  cholecalciferol (VITAMIN D) 1000 units tablet Take 1,000 Units by mouth daily.    [provider]  Evolocumab  (REPATHA  SURECLICK) 140 MG/ML SOAJ Inject 140 mg into the skin every 14 (fourteen) days. 05/31/24   [name], [name] K, MD  levothyroxine  (SYNTHROID ) 100 MCG tablet Take 1 tablet (100 mcg total) by mouth every morning. 08/21/24     losartan  (COZAAR ) 25 MG tablet Take 1 tablet (25 mg total) by mouth daily. Patient not taking: Reported on 09/05/2024 03/21/24     Multiple Vitamin (MULTIVITAMIN) tablet Take 1 tablet by mouth daily.    [provider]  omeprazole  (PRILOSEC) 20 MG capsule Take 1 capsule (20 mg total) by mouth 2 (two) times daily. 07/12/24       Physical Exam    Vital Signs:  [name] [name] does not have vital signs available for review today.  Given telephonic nature of communication, physical exam is limited. AAOx3. NAD. Normal affect.  Speech and respirations are unlabored.  Accessory Clinical Findings    None  Assessment & Plan    1.  Preoperative Cardiovascular Risk Assessment:  [name] [name] perioperative risk of a major cardiac event is 0.4% according to the Revised Cardiac Risk Index (RCRI).  Therefore, she is at low risk for perioperative complications.   Her functional capacity  is good at 5.81 METs according to the Duke Activity Status Index (DASI). Recommendations: According to ACC/AHA guidelines, no further cardiovascular testing needed.  The patient may proceed to surgery at acceptable risk.   Antiplatelet and/or Anticoagulation Recommendations: Aspirin can be held for 7 days prior to her surgery.  Please resume Aspirin post operatively when it is felt to be safe from a bleeding standpoint.   The patient was advised that if she develops new symptoms prior to surgery to contact our office to arrange for a follow-up visit, and she verbalized understanding.  A copy of this note will be routed to requesting surgeon.  Time:   Today, I have spent 7 minutes with the patient with telehealth technology discussing medical history, symptoms, and management plan.     Orren LOISE Fabry, PA-C  09/12/2024, 10:08 AM

## 2024-09-13 ENCOUNTER — Other Ambulatory Visit: Payer: Self-pay

## 2024-10-02 DIAGNOSIS — K5730 Diverticulosis of large intestine without perforation or abscess without bleeding: Secondary | ICD-10-CM | POA: Diagnosis not present

## 2024-10-02 DIAGNOSIS — Z860101 Personal history of adenomatous and serrated colon polyps: Secondary | ICD-10-CM | POA: Diagnosis not present

## 2024-10-02 DIAGNOSIS — Z09 Encounter for follow-up examination after completed treatment for conditions other than malignant neoplasm: Secondary | ICD-10-CM | POA: Diagnosis not present

## 2024-10-02 DIAGNOSIS — D125 Benign neoplasm of sigmoid colon: Secondary | ICD-10-CM | POA: Diagnosis not present

## 2024-10-04 DIAGNOSIS — D125 Benign neoplasm of sigmoid colon: Secondary | ICD-10-CM | POA: Diagnosis not present

## 2024-10-16 ENCOUNTER — Ambulatory Visit
Admission: RE | Admit: 2024-10-16 | Discharge: 2024-10-16 | Disposition: A | Discharge status: Home / Self Care | Source: Ambulatory Visit | Attending: Student | Admitting: Student

## 2024-10-16 DIAGNOSIS — K760 Fatty (change of) liver, not elsewhere classified: Secondary | ICD-10-CM | POA: Diagnosis not present

## 2024-10-17 ENCOUNTER — Encounter: Payer: Self-pay | Admitting: Internal Medicine

## 2024-10-23 ENCOUNTER — Encounter: Payer: Self-pay | Admitting: Internal Medicine

## 2024-10-23 ENCOUNTER — Other Ambulatory Visit (HOSPITAL_COMMUNITY): Payer: Self-pay

## 2024-10-24 ENCOUNTER — Telehealth: Payer: Self-pay | Admitting: Pharmacy Technician

## 2024-10-24 ENCOUNTER — Other Ambulatory Visit (HOSPITAL_COMMUNITY): Payer: Self-pay

## 2024-10-24 NOTE — Telephone Encounter (Signed)
 Upon submitting- PA says duplicate approved

## 2024-10-24 NOTE — Telephone Encounter (Signed)
 Pharmacy Patient Advocate Encounter   Received notification from Physician's Office that prior authorization for repatha  is required/requested.   Insurance verification completed.   The patient is insured through [facility] ADVANTAGE/RX ADVANCE.   Per test claim: PA required; PA submitted to above mentioned insurance via Latent Key/confirmation #/EOC [id number] Status is pending

## 2024-11-01 DIAGNOSIS — I712 Thoracic aortic aneurysm, without rupture, unspecified: Secondary | ICD-10-CM | POA: Diagnosis not present

## 2024-11-01 DIAGNOSIS — E7800 Pure hypercholesterolemia, unspecified: Secondary | ICD-10-CM | POA: Diagnosis not present

## 2024-11-01 DIAGNOSIS — K760 Fatty (change of) liver, not elsewhere classified: Secondary | ICD-10-CM | POA: Diagnosis not present

## 2024-11-01 DIAGNOSIS — K219 Gastro-esophageal reflux disease without esophagitis: Secondary | ICD-10-CM | POA: Diagnosis not present

## 2024-11-01 DIAGNOSIS — I10 Essential (primary) hypertension: Secondary | ICD-10-CM | POA: Diagnosis not present

## 2024-11-01 DIAGNOSIS — M8580 Other specified disorders of bone density and structure, unspecified site: Secondary | ICD-10-CM | POA: Diagnosis not present

## 2024-11-07 ENCOUNTER — Other Ambulatory Visit (HOSPITAL_BASED_OUTPATIENT_CLINIC_OR_DEPARTMENT_OTHER): Payer: Self-pay | Admitting: Internal Medicine

## 2024-11-07 DIAGNOSIS — M8580 Other specified disorders of bone density and structure, unspecified site: Secondary | ICD-10-CM

## 2024-11-15 ENCOUNTER — Other Ambulatory Visit: Payer: Self-pay

## 2024-11-18 ENCOUNTER — Other Ambulatory Visit (HOSPITAL_COMMUNITY): Payer: Self-pay

## 2024-11-27 ENCOUNTER — Ambulatory Visit: Payer: Self-pay | Admitting: Pulmonary Disease

## 2024-11-27 ENCOUNTER — Telehealth: Payer: Self-pay | Admitting: Pharmacy Technician

## 2024-11-27 ENCOUNTER — Other Ambulatory Visit (HOSPITAL_COMMUNITY): Payer: Self-pay

## 2024-11-27 DIAGNOSIS — J441 Chronic obstructive pulmonary disease with (acute) exacerbation: Secondary | ICD-10-CM

## 2024-11-27 MED ORDER — PREDNISONE 20 MG PO TABS: 40.0000 mg | ORAL_TABLET | Freq: Every day | ORAL | 0 refills | Status: DC

## 2024-11-27 MED ORDER — AZITHROMYCIN 250 MG PO TABS: 250.0000 mg | ORAL_TABLET | Freq: Every day | ORAL | 0 refills | Status: AC

## 2024-11-27 NOTE — Telephone Encounter (Signed)
 FYI Only or Action Required?: Action required by provider: update on patient condition. Requesting prednisone  and zpack-pharmacy confirmed  Patient is followed in Pulmonology for COPD, last seen on 09/05/2024 by Mannam, Praveen, MD.  Called Nurse Triage reporting Shortness of Breath.  Symptoms began several days ago.  Interventions attempted: OTC medications: Mucinex, Rescue inhaler, and Maintenance inhaler.  Symptoms are: gradually worsening.  Triage Disposition: See HCP Within 4 Hours (Or PCP Triage)  Patient/caregiver understands and will follow disposition?: No, wishes to speak with PCP  E2C2 Pulmonary Triage - Initial Assessment Questions Chief Complaint (e.g., cough, sob, wheezing, fever, chills, sweat or additional symptoms) *Go to specific symptom protocol after initial questions. SOB- cough w phelgm   How long have symptoms been present? Friday   Have you tested for COVID or Flu? Note: If not, ask patient if [name] home test can be taken. If so, instruct patient to call back for positive results. Yes  MEDICINES:   Have you used any OTC meds to help with symptoms? Yes If yes, ask What medications? Mucinex  Have you used your inhalers/maintenance medication? Yes If yes, What medications? Breztri - 2 puffs BID Rescue inhaler- having to use daily for last 3 days before her last Breztri  dose is due  If inhaler, ask How many puffs and how often? Note: Review instructions on medication in the chart. Daily for last few days prior to second breztri  dose   OXYGEN : Do you wear supplemental oxygen ? No If yes, How many liters are you supposed to use? denies  Do you monitor your oxygen  levels? Yes If yes, What is your reading (oxygen  level) today? 88-94%-- 88% up walking across the room- 30sec-1 minute to recover which is pretty normal   What is your usual oxygen  saturation reading?  (Note: Pulmonary O2 sats should be 90% or greater) 95-97%   Copied from  CRM #[id number]. Topic: Clinical - Red Word Triage >> Nov 27, 2024  9:22 AM [name] [name]: [facility] that prompted transfer to Nurse Triage: Difficulty breathing, cough with phlegm. Reason for Disposition  [1] MILD difficulty breathing (e.g., minimal/no SOB at rest, SOB with walking, pulse < 100) AND [2] NEW-onset or WORSE than normal  Answer Assessment - Initial Assessment Questions Since Friday- patient with productive cough and worsened SOB. COPD with O2 95-97% at baseline. On call she had to go get her Pulse Ox and when she returned from the walk across the house, O2 was 88% HR87. 30seconds-69minute for recovery up to 92-94%. Denies dizziness, vision changes during that time.  Productive cough with white-yellow phelgm, denies fever, CP.   Using her Breztri  2puffs BID. Rescue inhaler use was maybe once weekly, but is now daily in the afternoons/evening prior to when the evening Breztri  dose is due.   Typically treated with prednisone  and z-pack when she gets this every few months. Requesting if medications can be sent in to treat. Mucinex, hydratin, and humidity are helping with phelgm expectorant. Feels like lungs are inflammed due to being able to feel them when she breaths or coughs. Please advise. Pharmacy confirmed ED precautions understood.   1. RESPIRATORY STATUS: Describe your breathing? (e.g., wheezing, shortness of breath, unable to speak, severe coughing)      Difficulty breathing even after taking medication  2. ONSET: When did this breathing problem begin?      Friday  3. PATTERN Does the difficult breathing come and go, or has it been constant since it started?      Fluctuates-  worsened with activity 4. SEVERITY: How bad is your breathing? (e.g., mild, moderate, severe)      Mild-moderate- gets worse at the end of the day- no reserves  5. RECURRENT SYMPTOM: Have you had difficulty breathing before? If Yes, ask: When was the last time? and What happened that time?      Every couple months  6. CARDIAC HISTORY: Do you have any history of heart disease? (e.g., heart attack, angina, bypass surgery, angioplasty)      Thoracic aortic aneurysm  7. LUNG HISTORY: Do you have any history of lung disease?  (e.g., pulmonary embolus, asthma, emphysema)     COPD  8. CAUSE: What do you think is causing the breathing problem?      Infection  9. OTHER SYMPTOMS: Do you have any other symptoms? (e.g., chest pain, cough, dizziness, fever, runny nose)     Cough with phlegm  10. O2 SATURATION MONITOR:  Do you use an oxygen  saturation monitor (pulse oximeter) at home? If Yes, ask: What is your reading (oxygen  level) today? What is your usual oxygen  saturation reading? (e.g., 95%)       Unsure  Protocols used: Breathing Difficulty-[name]-AH

## 2024-11-27 NOTE — Telephone Encounter (Signed)
 I called the patient back. No fever or chills, no chest pain or LE swelling. Likely a COPDe. Prescription for prednisone  and azithromycin  provided. Advised to call back if symptoms not better in 3 days. If much worse then go to ER. Patient agrees.   Zola Herter, MD [facility] Office: [phone]   See Amion for personal pager PCCM on call pager [phone]  until 7pm. Please call Elink 7p-7a. [phone]

## 2024-11-27 NOTE — Telephone Encounter (Signed)
 Dr. Zaida, Patient had COPD flare in October, treated with steroids and antibiotic.  She was scheduled for an OV in January.  Please advise.  Thank you.

## 2024-11-27 NOTE — Telephone Encounter (Signed)
 Pharmacy Patient Advocate Encounter   Received notification from Physician's Office that prior authorization for Repatha  is required/requested.   Insurance verification completed.   The patient is insured through [facility] ADVANTAGE/RX ADVANCE.   Per test claim: PA required; PA submitted to above mentioned insurance via Latent Key/confirmation #/EOC [id number] Status is pending

## 2024-11-27 NOTE — Telephone Encounter (Signed)
 Pharmacy Patient Advocate Encounter  Received notification from [employer] that Prior Authorization for repatha  has been APPROVED from 11/27/24 to 11/27/25   PA #/Case ID/Reference #: [id number]

## 2024-11-29 ENCOUNTER — Encounter: Payer: Self-pay | Admitting: Pulmonary Disease

## 2024-11-29 DIAGNOSIS — J441 Chronic obstructive pulmonary disease with (acute) exacerbation: Secondary | ICD-10-CM

## 2024-11-29 MED ORDER — PREDNISONE 20 MG PO TABS: 40.0000 mg | ORAL_TABLET | Freq: Every day | ORAL | 0 refills | Status: AC

## 2024-12-05 ENCOUNTER — Other Ambulatory Visit: Payer: Self-pay | Admitting: Internal Medicine

## 2024-12-05 DIAGNOSIS — Z1231 Encounter for screening mammogram for malignant neoplasm of breast: Secondary | ICD-10-CM

## 2024-12-07 ENCOUNTER — Other Ambulatory Visit: Payer: Self-pay

## 2024-12-07 DIAGNOSIS — I7121 Aneurysm of the ascending aorta, without rupture: Secondary | ICD-10-CM

## 2024-12-12 ENCOUNTER — Other Ambulatory Visit: Payer: Self-pay

## 2024-12-12 ENCOUNTER — Other Ambulatory Visit (HOSPITAL_COMMUNITY): Payer: Self-pay

## 2024-12-13 ENCOUNTER — Other Ambulatory Visit: Payer: Self-pay

## 2025-01-01 ENCOUNTER — Ambulatory Visit (HOSPITAL_COMMUNITY): Admission: RE | Admit: 2025-01-01 | Source: Ambulatory Visit

## 2025-01-01 ENCOUNTER — Encounter (HOSPITAL_COMMUNITY): Payer: Self-pay

## 2025-01-10 ENCOUNTER — Ambulatory Visit (HOSPITAL_COMMUNITY)

## 2025-01-15 ENCOUNTER — Ambulatory Visit

## 2025-01-17 ENCOUNTER — Ambulatory Visit

## 2025-01-18 ENCOUNTER — Ambulatory Visit

## 2025-03-12 ENCOUNTER — Ambulatory Visit: Admitting: Pulmonary Disease
